# Patient Record
Sex: Female | Born: 1950 | Race: Black or African American | Hispanic: No | Marital: Married | Smoking: Never smoker
Health system: Southern US, Community
[De-identification: ages and names within clinical notes are randomized; demographics above are authoritative.]

## PROBLEM LIST (undated history)

## (undated) DIAGNOSIS — H409 Unspecified glaucoma: Secondary | ICD-10-CM

## (undated) DIAGNOSIS — E785 Hyperlipidemia, unspecified: Secondary | ICD-10-CM

## (undated) DIAGNOSIS — R002 Palpitations: Secondary | ICD-10-CM

## (undated) DIAGNOSIS — U071 COVID-19: Secondary | ICD-10-CM

## (undated) DIAGNOSIS — D649 Anemia, unspecified: Secondary | ICD-10-CM

## (undated) DIAGNOSIS — I10 Essential (primary) hypertension: Principal | ICD-10-CM

## (undated) DIAGNOSIS — F419 Anxiety disorder, unspecified: Secondary | ICD-10-CM

## (undated) HISTORY — DX: Essential (primary) hypertension: I10

## (undated) HISTORY — PX: TOE SURGERY: SHX1073

## (undated) HISTORY — PX: COLONOSCOPY: SHX174

## (undated) HISTORY — PX: WISDOM TOOTH EXTRACTION: SHX21

## (undated) HISTORY — DX: Palpitations: R00.2

## (undated) HISTORY — DX: Hyperlipidemia, unspecified: E78.5

## (undated) HISTORY — DX: Anxiety disorder, unspecified: F41.9

## (undated) HISTORY — PX: EXCISION MASS NECK: SHX6703

---

## 1998-08-07 ENCOUNTER — Other Ambulatory Visit: Admission: RE | Admit: 1998-08-07 | Discharge: 1998-08-07 | Payer: Self-pay | Admitting: Family Medicine

## 1998-10-19 ENCOUNTER — Other Ambulatory Visit: Admission: RE | Admit: 1998-10-19 | Discharge: 1998-10-19 | Payer: Self-pay | Admitting: Otolaryngology

## 1999-07-22 ENCOUNTER — Other Ambulatory Visit: Admission: RE | Admit: 1999-07-22 | Discharge: 1999-07-22 | Payer: Self-pay | Admitting: Otolaryngology

## 1999-09-30 ENCOUNTER — Other Ambulatory Visit: Admission: RE | Admit: 1999-09-30 | Discharge: 1999-09-30 | Payer: Self-pay | Admitting: Obstetrics and Gynecology

## 1999-09-30 ENCOUNTER — Encounter (INDEPENDENT_AMBULATORY_CARE_PROVIDER_SITE_OTHER): Payer: Self-pay | Admitting: Specialist

## 2000-12-29 ENCOUNTER — Other Ambulatory Visit: Admission: RE | Admit: 2000-12-29 | Discharge: 2000-12-29 | Payer: Self-pay | Admitting: Obstetrics and Gynecology

## 2001-02-28 ENCOUNTER — Encounter: Payer: Self-pay | Admitting: Emergency Medicine

## 2001-02-28 ENCOUNTER — Emergency Department (HOSPITAL_COMMUNITY): Admission: EM | Admit: 2001-02-28 | Discharge: 2001-02-28 | Payer: Self-pay | Admitting: Emergency Medicine

## 2001-04-25 ENCOUNTER — Ambulatory Visit (HOSPITAL_COMMUNITY): Admission: RE | Admit: 2001-04-25 | Discharge: 2001-04-25 | Payer: Self-pay | Admitting: Cardiology

## 2001-04-25 HISTORY — PX: CARDIAC CATHETERIZATION: SHX172

## 2001-04-25 HISTORY — PX: LEFT HEART CATH AND CORONARY ANGIOGRAPHY: CATH118249

## 2002-08-22 ENCOUNTER — Other Ambulatory Visit: Admission: RE | Admit: 2002-08-22 | Discharge: 2002-08-22 | Payer: Self-pay | Admitting: Obstetrics and Gynecology

## 2003-08-30 ENCOUNTER — Other Ambulatory Visit: Admission: RE | Admit: 2003-08-30 | Discharge: 2003-08-30 | Payer: Self-pay | Admitting: Obstetrics and Gynecology

## 2003-12-06 ENCOUNTER — Encounter: Admission: RE | Admit: 2003-12-06 | Discharge: 2004-01-02 | Payer: Self-pay | Admitting: Family Medicine

## 2004-09-10 ENCOUNTER — Other Ambulatory Visit: Admission: RE | Admit: 2004-09-10 | Discharge: 2004-09-10 | Payer: Self-pay | Admitting: Obstetrics and Gynecology

## 2009-01-30 HISTORY — PX: TRANSTHORACIC ECHOCARDIOGRAM: SHX275

## 2009-01-30 HISTORY — PX: OTHER SURGICAL HISTORY: SHX169

## 2009-02-13 HISTORY — PX: DOPPLER ECHOCARDIOGRAPHY: SHX263

## 2009-02-13 HISTORY — PX: NM MYOVIEW LTD: HXRAD82

## 2009-05-01 ENCOUNTER — Emergency Department (HOSPITAL_BASED_OUTPATIENT_CLINIC_OR_DEPARTMENT_OTHER): Admission: EM | Admit: 2009-05-01 | Discharge: 2009-05-01 | Payer: Self-pay | Admitting: Emergency Medicine

## 2010-03-23 ENCOUNTER — Encounter: Payer: Self-pay | Admitting: Obstetrics and Gynecology

## 2010-05-26 LAB — BASIC METABOLIC PANEL
BUN: 12 mg/dL (ref 6–23)
CO2: 30 mEq/L (ref 19–32)
Calcium: 9.6 mg/dL (ref 8.4–10.5)
Chloride: 105 mEq/L (ref 96–112)
Creatinine, Ser: 0.7 mg/dL (ref 0.4–1.2)
GFR calc Af Amer: >60 mL/min (ref >60)
GFR calc non Af Amer: >60 mL/min (ref >60)
Glucose, Bld: 80 mg/dL (ref 70–99)
Potassium: 3.9 mEq/L (ref 3.5–5.1)
Sodium: 144 mEq/L (ref 135–145)

## 2010-05-26 LAB — DIFFERENTIAL
Basophils Absolute: 0.1 10*3/uL (ref 0.0–0.1)
Basophils Relative: 1 % (ref 0–1)
Eosinophils Absolute: 0 10*3/uL (ref 0.0–0.7)
Eosinophils Relative: 0 % (ref 0–5)
Lymphocytes Relative: 46 % (ref 12–46)
Lymphs Abs: 2.2 10*3/uL (ref 0.7–4.0)
Monocytes Absolute: 0.4 10*3/uL (ref 0.1–1.0)
Monocytes Relative: 9 % (ref 3–12)
Neutro Abs: 2.1 10*3/uL (ref 1.7–7.7)
Neutrophils Relative %: 44 % (ref 43–77)

## 2010-05-26 LAB — CBC
HCT: 35.2 % — ABNORMAL LOW (ref 36.0–46.0)
Hemoglobin: 11.8 g/dL — ABNORMAL LOW (ref 12.0–15.0)
MCHC: 33.5 g/dL (ref 30.0–36.0)
MCV: 83 fL (ref 78.0–100.0)
Platelets: 229 10*3/uL (ref 150–400)
RBC: 4.24 MIL/uL (ref 3.87–5.11)
RDW: 13.5 % (ref 11.5–15.5)
WBC: 4.8 10*3/uL (ref 4.0–10.5)

## 2010-07-18 NOTE — Cardiovascular Report (Signed)
[place]. [facility]  Patient:    Nichole Walters, Nichole Walters Visit Number: [id number] MRN: [id number]          Service Type: CAT Location: [facility] ([place]) 2899 12 Attending Physician:  Loreli Dollar Dictated by:   Julieanne Manson, M.D. Proc. Date: 04/25/01 Admit Date:  04/25/2001   CC:         Miguel Aschoff, M.D.  Cardiac Catheterization Laboratory   Cardiac Catheterization  INDICATIONS FOR PROCEDURE: The patient is a 60 year old female, who has mitral valve prolapse and has had chronic PVCs. She has complained of atypical chest pain and underwent a nuclear study that was strongly positive for anterior ischemia. Because of this, she is brought to the catheterization lab for an outpatient cardiac catheterization.  DESCRIPTION OF PROCEDURE: The patient was prepped and draped in the usual sterile fashion exposing the right groin.  Following local anesthetic with 1% Xylocaine, the Seldinger technique was employed and a 5 [place] introducer sheath was placed in the right femoral artery.  Selective left and right coronary arteriography and ventriculography in the RAO projection was performed.  COMPLICATIONS: None.  EQUIPMENT: A 5 French Judkins configuration catheter.  RESULTS: 1. Hemodynamic monitoring: Central aortic pressure was 149/77, left    ventricular pressure was 150/14. There was no aortic valve gradient    noted at the time of pullback. 2. Ventriculography: Ventriculography in the RAO projection using    25 cc of contrast at 12 cc/sec. revealed normal left ventricular systolic    function. Ejection fraction greater than 60%.  Mitral valve prolapse    without mitral regurgitation was seen.  The end-diastolic pressure was    14.  CORONARY ARTERIOGRAPHY: No calcification was seen on fluoroscopy. 1. Left main: Normal. 2. LAD: The LAD extended down to the apex of the heart and gave rise to    two small diagonal branches. There was no significant coronary disease  in    this entire system. There was an area in the mid to distal portion of the    LAD where the vessel became from 3intramyocardial to extramyocardial    where there was some tenting of the LAD, but there was no decreased flow    and clearly no area of fixed obstructions. 3. Circumflex: The circumflex gave rise to two large OM vessels. There was    minor irregularities in the proximal circumflex but the OMs were free    of disease. 4. Right coronary artery: The right coronary artery was a small vessel at    about 2 mm in diameter. It gave rise to a PDA. This system was free of    disease.  CONCLUSIONS: 1. Normal left ventricular systolic function. 2. Mitral valve prolapse without mitral regurgitation. 3. Minimal irregularities in the circumflex. 4. Tenting in the left anterior descending where it become extra myocardial    from intramyocardial. There is clearly no areas of fixed obstruction.  DISCUSSION: It appears that her stress test was a false-positive study. Her PVCs are chronic and probably related to her mitral valve prolapse and I could see no areas to suggest obstructive flow. Dr. Tresa Endo reviewed the films with me and does not feel that there is epicardial coronary disease either. Dictated by:   Julieanne Manson, M.D. Attending Physician:  Loreli Dollar DD:  04/25/01 TD:  04/25/01 Job: [id number]

## 2010-12-10 ENCOUNTER — Other Ambulatory Visit: Payer: Self-pay | Admitting: Obstetrics and Gynecology

## 2012-01-12 ENCOUNTER — Other Ambulatory Visit: Payer: Self-pay | Admitting: Obstetrics and Gynecology

## 2012-01-12 DIAGNOSIS — R928 Other abnormal and inconclusive findings on diagnostic imaging of breast: Secondary | ICD-10-CM

## 2012-01-26 ENCOUNTER — Ambulatory Visit
Admission: RE | Admit: 2012-01-26 | Discharge: 2012-01-26 | Disposition: A | Discharge status: Home / Self Care | Payer: BC Managed Care – PPO | Source: Ambulatory Visit | Attending: Obstetrics and Gynecology | Admitting: Obstetrics and Gynecology

## 2012-01-26 ENCOUNTER — Other Ambulatory Visit: Payer: Self-pay | Admitting: Obstetrics and Gynecology

## 2012-01-26 DIAGNOSIS — R928 Other abnormal and inconclusive findings on diagnostic imaging of breast: Secondary | ICD-10-CM

## 2012-08-15 ENCOUNTER — Telehealth: Payer: Self-pay | Admitting: Cardiology

## 2012-08-15 MED ORDER — AMLODIPINE BESYLATE 5 MG PO TABS: 5.0000 mg | ORAL_TABLET | Freq: Every day | ORAL | Status: DC

## 2012-08-15 MED ORDER — IRBESARTAN-HYDROCHLOROTHIAZIDE 150-12.5 MG PO TABS: 1.0000 | ORAL_TABLET | Freq: Every day | ORAL | Status: DC

## 2012-08-15 NOTE — Telephone Encounter (Signed)
Amlodipine 5mg  QD, Irbesartan hctz 150/12.5mg  QD 90 day w/[facility]

## 2012-08-15 NOTE — Telephone Encounter (Signed)
Returned call.  Left message to call back before 4pm.  

## 2012-08-15 NOTE — Telephone Encounter (Signed)
Amlodipine 5mg QD, Irbesartan hctz 150/12.5mg QD 90 day w/2 [facility] 

## 2012-08-15 NOTE — Telephone Encounter (Signed)
Please call [facility] so she can get her Amlodipine Besylate 5mg  and Irbesaran-HCTZ 150/12.5-Need this right away-would you please call in refills so she will not have to call each time!

## 2012-08-16 ENCOUNTER — Other Ambulatory Visit: Payer: Self-pay | Admitting: *Deleted

## 2012-08-16 ENCOUNTER — Telehealth: Payer: Self-pay | Admitting: Cardiology

## 2012-08-16 MED ORDER — AMLODIPINE BESYLATE 5 MG PO TABS: 5.0000 mg | ORAL_TABLET | Freq: Every day | ORAL | Status: DC

## 2012-08-16 MED ORDER — IRBESARTAN-HYDROCHLOROTHIAZIDE 150-12.5 MG PO TABS: 1.0000 | ORAL_TABLET | Freq: Every day | ORAL | Status: DC

## 2012-08-16 NOTE — Telephone Encounter (Signed)
Returning your call. °

## 2012-08-16 NOTE — Telephone Encounter (Signed)
Returned call.  Pt informed refills sent yesterday and confirmed received by [facility] this morning.  Pt with concerns that she has to call the office every month and informed she should not have to call the office as her Rx has 2 refills on a 90-day supply.  Pt verbalized understanding and agreed w/ plan.  Pt will call back if she has any problems w/ getting Rx.

## 2012-08-16 NOTE — Telephone Encounter (Signed)
Nichole Walters says all she need is her prescription-says she is completely out of medicine!

## 2013-01-06 ENCOUNTER — Other Ambulatory Visit: Payer: Self-pay | Admitting: Obstetrics and Gynecology

## 2013-05-08 ENCOUNTER — Other Ambulatory Visit: Payer: Self-pay | Admitting: Cardiology

## 2013-05-08 ENCOUNTER — Encounter: Payer: Self-pay | Admitting: Cardiology

## 2013-05-08 ENCOUNTER — Ambulatory Visit (INDEPENDENT_AMBULATORY_CARE_PROVIDER_SITE_OTHER): Payer: BC Managed Care – PPO | Admitting: Cardiology

## 2013-05-08 VITALS — BP 120/70 | HR 63 | Ht 60.0 in | Wt 124.2 lb

## 2013-05-08 DIAGNOSIS — R002 Palpitations: Secondary | ICD-10-CM

## 2013-05-08 DIAGNOSIS — E785 Hyperlipidemia, unspecified: Secondary | ICD-10-CM | POA: Insufficient documentation

## 2013-05-08 DIAGNOSIS — I10 Essential (primary) hypertension: Secondary | ICD-10-CM

## 2013-05-08 HISTORY — DX: Essential (primary) hypertension: I10

## 2013-05-08 HISTORY — DX: Hyperlipidemia, unspecified: E78.5

## 2013-05-08 HISTORY — DX: Palpitations: R00.2

## 2013-05-08 NOTE — Telephone Encounter (Signed)
Rx was sent to pharmacy electronically. 

## 2013-05-08 NOTE — Patient Instructions (Signed)
Your blood pressure & Heart rate look great.  Your PCP put you on Pravastatin for your cholesterol -- this is a relatively mild cholesterol medicine & should be well tolerated.  One side effect is muscle aches or cramping.   If you start noticing these, try taking CoEnzyme Q 10 (CoQ10) -- an Over the Counter Vitamin Supplement (100-300 mg daily).  I will see you back in 1 yr.  Leonie Man, MD  Your physician wants you to follow-up in: 1 yr.  You will receive a reminder letter in the mail two months in advance. If you don't receive a letter, please call our office to schedule the follow-up appointment.

## 2013-05-10 ENCOUNTER — Encounter: Payer: Self-pay | Admitting: Cardiology

## 2013-05-10 NOTE — Progress Notes (Signed)
PATIENT: Nichole Walters MRN: [id number]  DOB: 12/22/1950   DOV:05/10/2013 PCP: Gavin Pound, MD  Clinic Note: Chief Complaint  Patient presents with  . Annual Exam    no complaints   HPI: Nichole Walters is a 63 y.o.  female with a PMH below who presents today for annual cardiology followup. She is a 63-year-old woman history of nausea coronary disease in 2003 and not carotid disease by Doppler's. She palpitations or that related to anxiety. Her cardiac risk factors include hypertension dyslipidemia..  Interval History: She presents today feeling well. She IV symptoms and of late mostly because of her social stressors being less apparent. In light of this, her palpitations of essentially become nonexistent.  She notes having had a relatively normal evaluation per primary care physician with a relatively well-controlled blood pressures recently. No chest pain or shortness of breath with rest or exertion. No PND, orthopnea or edema.  Minimal palpitations, with No lightheadedness, dizziness, weakness or syncope/near syncope. No TIA/amaurosis fugax symptoms. No melena, hematochezia hematuria.  Past Medical History  Diagnosis Date  . Essential hypertension 05/08/2013  . Palpitations 05/08/2013  . Dyslipidemia 05/08/2013  . Anxiety     Prior Cardiac Evaluation   Procedure Laterality Date  . Transthoracic echocardiogram  December 2010    Normal LV size and function. Mild MR and TR. Otherwise normal. Normal diastolic function.  Marland Kitchen Nm persantine myoview ltd  December 2010    EF> 70%. No ischemia or infarction    No Known Allergies  Current Outpatient Prescriptions  Medication Sig Dispense Refill  . amLODipine (NORVASC) 5 MG tablet TAKE 1 TABLET BY MOUTH EVERY DAY  90 tablet  0  . irbesartan-hydrochlorothiazide (AVALIDE) 150-12.5 MG per tablet Take 1 tablet by mouth daily.  90 tablet  2  . latanoprost (XALATAN) 0.005 % ophthalmic solution Place 1 drop into both eyes at bedtime.      Marland Kitchen OVER THE  COUNTER MEDICATION daily. Ligaplex II 5300 - herbal supplement      . pravastatin (PRAVACHOL) 20 MG tablet Take 1 tablet by mouth daily.       No current facility-administered medications for this visit.   History   Social History Narrative   Single mother of one, grandmother 74, great-grandmother 2.   Does not smoke, does not drink.   Exercises usually on treadmill mostly 3 days a week.   ROS: A comprehensive Review of Systems - Negative except Notably improved anxiety and palpitations. She does have mild cramping and muscle aching. Otherwise  PHYSICAL EXAM BP 120/70  Pulse 63  Ht 5' (1.524 m)  Wt 124 lb 3.2 oz (56.337 kg)  BMI 24.26 kg/m2 General appearance: alert, cooperative, appears stated age, no distress and Well-nourished and well-groomed. Normal mood and affect. Neck: no adenopathy, no carotid bruit, no JVD, supple, symmetrical, trachea midline and thyroid not enlarged, symmetric, no tenderness/mass/nodules Lungs: clear to auscultation bilaterally, normal percussion bilaterally and Nonlabored, good air movement. Heart: RRR, normal S1 and S2. Soft 1/6 at most at this time her to the apex. No other R/G. Nondisplaced PMI. Abdomen: soft, non-tender; bowel sounds normal; no masses,  no organomegaly Extremities: extremities normal, atraumatic, no cyanosis or edema, no edema, redness or tenderness in the calves or thighs and no ulcers, gangrene or trophic changes Pulses: 2+ and symmetric Neurologic: Alert and oriented X 3, normal strength and tone. Normal symmetric reflexes. Normal coordination and gait  OZY:YQMGNOIBB today: Yes Rate: 63 , Rhythm: NSR, normal EKG;  Recent  Labs: 01/12/2013  CBC: WBC 4.4, H./H. 11.4/37.7, platelet count 244.  Chemistries: Sodium 140, potassium 4.1, chloride 105, bicarbonate 31, BUN 15, creatinine 0.66, calcium 9.6. LFTs normal.  TC 181 a 1, TG 67, HDL 53, LDL 114.  ASSESSMENT / PLAN: Essential hypertension Excellent control on current  regimen.  Palpitations Much improved with less anxiety. She has not been on a beta blocker. That would be a potential option if palpitations recur.  Dyslipidemia On pravastatin. With her risk factors, LDL of 114 his acceptable.    Orders Placed This Encounter  Procedures  . EKG 12-Lead   Meds ordered this encounter  Medications  . pravastatin (PRAVACHOL) 20 MG tablet    Sig: Take 1 tablet by mouth daily.  Marland Kitchen latanoprost (XALATAN) 0.005 % ophthalmic solution    Sig: Place 1 drop into both eyes at bedtime.  Marland Kitchen OVER THE COUNTER MEDICATION    Sig: daily. Ligaplex II 5300 - herbal supplement    Followup: One year  DAVID W. Ellyn Hack, M.D., M.S. Interventional Cardiology [facility]

## 2013-05-10 NOTE — Assessment & Plan Note (Signed)
Much improved with less anxiety. She has not been on a beta blocker. That would be a potential option if palpitations recur.

## 2013-05-10 NOTE — Assessment & Plan Note (Signed)
Excellent control on current regimen. 

## 2013-05-10 NOTE — Assessment & Plan Note (Signed)
On pravastatin. With her risk factors, LDL of 114 his acceptable.

## 2013-05-11 ENCOUNTER — Encounter: Payer: Self-pay | Admitting: Cardiology

## 2013-07-18 ENCOUNTER — Other Ambulatory Visit: Payer: Self-pay | Admitting: *Deleted

## 2013-07-18 MED ORDER — IRBESARTAN-HYDROCHLOROTHIAZIDE 150-12.5 MG PO TABS: 1.0000 | ORAL_TABLET | Freq: Every day | ORAL | Status: DC

## 2013-07-18 MED ORDER — AMLODIPINE BESYLATE 5 MG PO TABS: 5.0000 mg | ORAL_TABLET | Freq: Every day | ORAL | Status: DC

## 2013-09-07 ENCOUNTER — Other Ambulatory Visit: Payer: Self-pay | Admitting: *Deleted

## 2014-05-15 ENCOUNTER — Other Ambulatory Visit: Payer: Self-pay | Admitting: *Deleted

## 2014-05-15 ENCOUNTER — Telehealth: Payer: Self-pay | Admitting: Cardiology

## 2014-05-15 MED ORDER — AMLODIPINE BESYLATE 5 MG PO TABS: 5.0000 mg | ORAL_TABLET | Freq: Every day | ORAL | Status: DC

## 2014-05-15 NOTE — Telephone Encounter (Signed)
filled

## 2014-05-15 NOTE — Telephone Encounter (Signed)
°  1. Which medications need to be refilled? Amlodipne 2. Which pharmacy [phone]  3. Do they need a 30 day or 90 day supply? 90 and refills  4. Would they like a call back once the medication has been sent to the pharmacy? yes

## 2014-05-21 ENCOUNTER — Ambulatory Visit: Payer: Self-pay | Admitting: Cardiology

## 2014-07-06 ENCOUNTER — Ambulatory Visit (INDEPENDENT_AMBULATORY_CARE_PROVIDER_SITE_OTHER): Payer: BLUE CROSS/BLUE SHIELD | Admitting: Cardiology

## 2014-07-06 ENCOUNTER — Encounter: Payer: Self-pay | Admitting: Cardiology

## 2014-07-06 VITALS — BP 112/64 | HR 62 | Ht 60.0 in | Wt 132.1 lb

## 2014-07-06 DIAGNOSIS — E785 Hyperlipidemia, unspecified: Secondary | ICD-10-CM | POA: Diagnosis not present

## 2014-07-06 DIAGNOSIS — I10 Essential (primary) hypertension: Secondary | ICD-10-CM | POA: Diagnosis not present

## 2014-07-06 DIAGNOSIS — R002 Palpitations: Secondary | ICD-10-CM

## 2014-07-06 NOTE — Progress Notes (Signed)
PATIENT: Nichole Walters MRN: [id number]  DOB: [birth year]   DOV:07/06/2014 PCP: Gavin Pound, MD  Clinic Note: Chief Complaint  Patient presents with  . Annual Exam    pt denied chest pain and SOB   HPI: Nichole Walters is a 64 y.o.  female with a PMH below who presents today for annual cardiology followup.   Interval History: She presents today feeling well.  Has been under a great deal of stress with work & social schedule - 6-7 d/week working & late nights with little sleep -- thankfully this schedule is now coming to an end she will be back to her normal schedule. Hopefully that will reduce her anxiety.  With this schedule, she has been quite stressed & tired.  Despite all the stress, her Palpitations have been stable to non-existent.  No chest pain or shortness of breath with rest or exertion. No PND, orthopnea or edema.  Minimal palpitations, with No lightheadedness, dizziness, weakness or syncope/near syncope. No TIA/amaurosis fugax symptoms.  Past Medical History  Diagnosis Date  . Essential hypertension 05/08/2013  . Palpitations 05/08/2013  . Dyslipidemia 05/08/2013  . Anxiety     Prior Cardiac Evaluation   Procedure Laterality Date  . Transthoracic echocardiogram  December 2010    Normal LV size and function. Mild MR and TR. Otherwise normal. Normal diastolic function.  Marland Kitchen Nm persantine myoview ltd  December 2010    EF> 70%. No ischemia or infarction   No Known Allergies  Current Outpatient Prescriptions  Medication Sig Dispense Refill  . amLODipine (NORVASC) 5 MG tablet Take 1 tablet (5 mg total) by mouth daily. 90 tablet 3  . irbesartan-hydrochlorothiazide (AVALIDE) 150-12.5 MG per tablet Take 1 tablet by mouth daily. 90 tablet 3  . latanoprost (XALATAN) 0.005 % ophthalmic solution Place 1 drop into both eyes at bedtime.    Marland Kitchen OVER THE COUNTER MEDICATION daily. Ligaplex II 5300 - herbal supplement    . pravastatin (PRAVACHOL) 20 MG tablet Take 1 tablet by mouth daily.      No current facility-administered medications for this visit.   History   Social History Narrative   Single mother of one, grandmother 15, great-grandmother 2.   Does not smoke, does not drink.   Exercises usually on treadmill mostly 3 days a week.   No family history on file.  Paternal Aunts & uncles had CVAs, PGM- CVA  ROS: A comprehensive Review of Systems - was performed  Review of Systems  Constitutional: Negative for weight loss (with busy work schedule she has not been exercising much and has gained some weight.).  HENT: Negative for nosebleeds.        No allergy problems  Respiratory: Negative for cough and shortness of breath.   Cardiovascular: Negative for claudication.  Gastrointestinal: Negative for heartburn, constipation and blood in stool.  Genitourinary: Negative for hematuria.  Endo/Heme/Allergies: Does not bruise/bleed easily.  Psychiatric/Behavioral: The patient is nervous/anxious (With work schedule).        Finally at the end of the 9 months rigorous schedule, starting to relax now - back to normal schedule & hopes to get back exercising.  All other systems reviewed and are negative.   Wt Readings from Last 3 Encounters:  07/06/14 59.92 kg (132 lb 1.6 oz)  05/08/13 56.337 kg (124 lb 3.2 oz)    PHYSICAL EXAM BP 112/64 mmHg  Pulse 62  Ht 5' (1.524 m)  Wt 59.92 kg (132 lb 1.6 oz)  BMI 25.80 kg/m2 General  appearance: alert, cooperative, appears stated age, no distress and Well-nourished and well-groomed. Normal mood and affect. Neck: no adenopathy, no carotid bruit, no JVD, supple, symmetrical, trachea midline and thyroid not enlarged, symmetric, no tenderness/mass/nodules Lungs: clear to auscultation bilaterally, normal percussion bilaterally and Nonlabored, good air movement. Heart: RRR, normal S1 and S2. Soft 1/6 at most at this time her to the apex. No other R/G. Nondisplaced PMI. Abdomen: soft, non-tender; bowel sounds normal; no masses,  no  organomegaly Extremities: extremities normal, atraumatic, no cyanosis or edema, no edema, redness or tenderness in the calves or thighs and no ulcers, gangrene or trophic changes Pulses: 2+ and symmetric Neurologic: Alert and oriented X 3, normal strength and tone. Normal symmetric reflexes. Normal coordination and gait  IBB:CWUGQBVQX today: Yes Rate: 62 , Rhythm: NSR, normal EKG (~ CRO Ant Infarct, age undetermined); stable EKG   Recent Labs: Pending - to be checked in Oct (do not have labs from 2015) -- stared Pravachol last year  ASSESSMENT / PLAN:  Problem List Items Addressed This Visit    None       Followup: One year  DAVID W. Ellyn Hack, M.D., M.S. Interventional Cardiology [facility]   Essential hypertension Excellent control on current regimen.  Palpitations Much improved with less anxiety. She has not been on a beta blocker. That would be a potential option if palpitations recur.  Dyslipidemia On pravastatin. With her risk factors, LDL of 114 his acceptable.

## 2014-07-06 NOTE — Patient Instructions (Signed)
No change with  current medication  Your physician wants you to follow-up in 12 months Dr Ellyn Hack.  You will receive a reminder letter in the mail two months in advance. If you don't receive a letter, please call our office to schedule the follow-up appointment.

## 2014-07-08 ENCOUNTER — Encounter: Payer: Self-pay | Admitting: Cardiology

## 2014-07-08 NOTE — Assessment & Plan Note (Signed)
Excellent control on current regimen. 

## 2014-07-08 NOTE — Assessment & Plan Note (Addendum)
Started on pravastatin last year. Labs monitored by PCP. Goal LDL would be between 100 to 130.

## 2014-07-08 NOTE — Assessment & Plan Note (Signed)
Her palpitations seem to have been less of an issue for her despite all of her social stresses with work. Not on beta blocker -- but as long as she is asymptomatic/minimally symptomatic would not add medication.

## 2014-09-07 ENCOUNTER — Telehealth: Payer: Self-pay | Admitting: Cardiology

## 2014-09-07 MED ORDER — IRBESARTAN-HYDROCHLOROTHIAZIDE 150-12.5 MG PO TABS: 1.0000 | ORAL_TABLET | Freq: Every day | ORAL | Status: DC

## 2014-09-07 NOTE — Telephone Encounter (Signed)
Refill submitted to patient's preferred pharmacy.  

## 2014-09-07 NOTE — Telephone Encounter (Signed)
°  1. Which medications need to be refilled? Irbesartan-HCTZ  2. Which [phone]  3. Do they need a 30 day or 90 day supply? 90 and refills  4. Would they like a call back once the medication has been sent to the pharmacy? no

## 2015-03-06 LAB — HM PAP SMEAR: HM Pap smear: NEGATIVE

## 2015-07-09 NOTE — H&P (Signed)
Nichole Walters is an 65 y.o. female. She presents for hysteroscopy and endometrial polypectomy  Pertinent Gynecological History: Menses: post-menopausal Bleeding: post menopausal bleeding Last mammogram: normal Date: 2017 Last pap: normal Date: 2014 OB History: G2, P1011    Past Medical History  Diagnosis Date  . Essential hypertension 05/08/2013  . Palpitations 05/08/2013  . Dyslipidemia 05/08/2013  . Anxiety     Past Surgical History  Procedure Laterality Date  . Transthoracic echocardiogram  December 2010    Normal LV size and function. Mild MR and TR. Otherwise normal. Normal diastolic function.  Marland Kitchen Nm persantine myoview ltd  December 2010    EF> 70%. No ischemia or infarction  . Doppler echocardiography  02/13/2009    EF>55%  . Nm myoview ltd  02/13/2009    EF 70%  Low risk scan  . Cardiac catheterization  04/25/2001    EF>60%    No family history on file.  Social History:  reports that she has never smoked. She has never used smokeless tobacco. She reports that she does not drink alcohol or use illicit drugs.  Allergies: No Known Allergies  No prescriptions prior to admission    Review of Systems  Constitutional: Negative.   Eyes: Negative.   Respiratory: Negative.   Cardiovascular: Negative.   Gastrointestinal: Negative.   Musculoskeletal: Negative.   Skin: Negative.   Neurological: Negative.     There were no vitals taken for this visit. Physical Exam  Constitutional: She appears well-developed and well-nourished.  HENT:  Head: Normocephalic.  Eyes: Pupils are equal, round, and reactive to light.  Neck: Normal range of motion.  Cardiovascular: Normal rate and regular rhythm.   Respiratory: Effort normal.  GI: Soft.  Genitourinary: Vagina normal.  3 cm left pedunculated firbroid.  Musculoskeletal: Normal range of motion.  Neurological: She is alert.    Office ultrasound shows 2 cm thickened endometrium.  Office endometrial aspiration shows benign  tissue and polyp fragments.  Assessment/Plan: Post menopausal bleeding with thickened endometrium.  Office sample was benign and she is admitted for hysteroscopy to confirm benign endometrium and to removed probably endometrial polyp.  Cyndia Degraff D 07/09/2015, 5:31 PM

## 2015-07-09 NOTE — Patient Instructions (Addendum)
Your procedure is scheduled on:  Monday, Jul 22, 2015  Enter through the Main Entrance of [facility] at: 8:30 AM  Pick up the phone at the desk and dial [phone].  Call this number if you have problems the morning of surgery: [phone].  Remember: Do NOT eat food or drink after:  Midnight Sunday, Jul 21, 2015  Take these medicines the morning of surgery with a SIP OF WATER:  Amlodipine, irbesartan-hydrochlorothiazide, Pravastatin  Do NOT wear jewelry (body piercing), metal hair clips/bobby pins, make-up, or nail polish. Do NOT wear lotions, powders, or perfumes.  You may wear deodorant. Do NOT shave for 48 hours prior to surgery. Do NOT bring valuables to the hospital. Contacts, dentures, or bridgework may not be worn into surgery.  Have a responsible adult drive you home and stay with you for 24 hours after your procedure.  Home with Brother Salvadore Dom cell [phone].

## 2015-07-11 ENCOUNTER — Encounter (HOSPITAL_COMMUNITY): Payer: Self-pay

## 2015-07-11 ENCOUNTER — Other Ambulatory Visit: Payer: Self-pay

## 2015-07-11 ENCOUNTER — Encounter (HOSPITAL_COMMUNITY)
Admission: RE | Admit: 2015-07-11 | Discharge: 2015-07-11 | Disposition: A | Discharge status: Home / Self Care | Payer: BLUE CROSS/BLUE SHIELD | Source: Ambulatory Visit | Attending: Obstetrics & Gynecology | Admitting: Obstetrics & Gynecology

## 2015-07-11 DIAGNOSIS — F419 Anxiety disorder, unspecified: Secondary | ICD-10-CM | POA: Insufficient documentation

## 2015-07-11 DIAGNOSIS — I10 Essential (primary) hypertension: Secondary | ICD-10-CM | POA: Insufficient documentation

## 2015-07-11 DIAGNOSIS — Z01812 Encounter for preprocedural laboratory examination: Secondary | ICD-10-CM | POA: Diagnosis present

## 2015-07-11 DIAGNOSIS — N959 Unspecified menopausal and perimenopausal disorder: Secondary | ICD-10-CM | POA: Diagnosis not present

## 2015-07-11 LAB — BASIC METABOLIC PANEL
Anion gap: 6 (ref 5–15)
BUN: 12 mg/dL (ref 6–20)
CHLORIDE: 106 mmol/L (ref 101–111)
CO2: 29 mmol/L (ref 22–32)
Calcium: 9.5 mg/dL (ref 8.9–10.3)
Creatinine, Ser: 0.55 mg/dL (ref 0.44–1.00)
GFR calc Af Amer: >60 mL/min (ref >60)
GFR calc non Af Amer: >60 mL/min (ref >60)
GLUCOSE: 88 mg/dL (ref 65–99)
POTASSIUM: 3.9 mmol/L (ref 3.5–5.1)
Sodium: 141 mmol/L (ref 135–145)

## 2015-07-11 LAB — CBC
HCT: 35.4 % — ABNORMAL LOW (ref 36.0–46.0)
HEMOGLOBIN: 11.6 g/dL — AB (ref 12.0–15.0)
MCH: 26.3 pg (ref 26.0–34.0)
MCHC: 32.8 g/dL (ref 30.0–36.0)
MCV: 80.3 fL (ref 78.0–100.0)
Platelets: 253 10*3/uL (ref 150–400)
RBC: 4.41 MIL/uL (ref 3.87–5.11)
RDW: 15.4 % (ref 11.5–15.5)
WBC: 5.3 10*3/uL (ref 4.0–10.5)

## 2015-07-18 MED ORDER — SCOPOLAMINE 1 MG/3DAYS TD PT72: 1.0000 | MEDICATED_PATCH | Freq: Once | TRANSDERMAL | Status: DC

## 2015-07-22 ENCOUNTER — Ambulatory Visit (HOSPITAL_COMMUNITY): Payer: BLUE CROSS/BLUE SHIELD | Admitting: Anesthesiology

## 2015-07-22 ENCOUNTER — Encounter (HOSPITAL_COMMUNITY): Payer: Self-pay | Admitting: Anesthesiology

## 2015-07-22 ENCOUNTER — Encounter (HOSPITAL_COMMUNITY)
Admission: RE | Disposition: A | Discharge status: Home / Self Care | Payer: Self-pay | Source: Ambulatory Visit | Attending: Obstetrics & Gynecology

## 2015-07-22 ENCOUNTER — Ambulatory Visit (HOSPITAL_COMMUNITY)
Admission: RE | Admit: 2015-07-22 | Discharge: 2015-07-22 | Disposition: A | Discharge status: Home / Self Care | Payer: BLUE CROSS/BLUE SHIELD | Source: Ambulatory Visit | Attending: Obstetrics & Gynecology | Admitting: Obstetrics & Gynecology

## 2015-07-22 DIAGNOSIS — I10 Essential (primary) hypertension: Secondary | ICD-10-CM | POA: Diagnosis not present

## 2015-07-22 DIAGNOSIS — R938 Abnormal findings on diagnostic imaging of other specified body structures: Secondary | ICD-10-CM | POA: Diagnosis not present

## 2015-07-22 DIAGNOSIS — N950 Postmenopausal bleeding: Secondary | ICD-10-CM | POA: Insufficient documentation

## 2015-07-22 DIAGNOSIS — F419 Anxiety disorder, unspecified: Secondary | ICD-10-CM | POA: Insufficient documentation

## 2015-07-22 DIAGNOSIS — N840 Polyp of corpus uteri: Secondary | ICD-10-CM | POA: Insufficient documentation

## 2015-07-22 HISTORY — PX: HYSTEROSCOPY: SHX211

## 2015-07-22 SURGERY — HYSTEROSCOPY
Anesthesia: General

## 2015-07-22 MED ORDER — ONDANSETRON HCL 4 MG/2ML IJ SOLN
INTRAMUSCULAR | Status: DC | PRN
  Administered 2015-07-22: 4 mg via INTRAVENOUS

## 2015-07-22 MED ORDER — GLYCOPYRROLATE 0.2 MG/ML IJ SOLN
INTRAMUSCULAR | Status: AC
  Filled 2015-07-22: qty 1

## 2015-07-22 MED ORDER — EPHEDRINE SULFATE 50 MG/ML IJ SOLN
INTRAMUSCULAR | Status: DC | PRN
  Administered 2015-07-22 (×4): 5 mg via INTRAVENOUS

## 2015-07-22 MED ORDER — FENTANYL CITRATE (PF) 100 MCG/2ML IJ SOLN: 25.0000 ug | INTRAMUSCULAR | Status: DC | PRN

## 2015-07-22 MED ORDER — GLYCOPYRROLATE 0.2 MG/ML IJ SOLN
INTRAMUSCULAR | Status: DC | PRN
  Administered 2015-07-22: 0.1 mg via INTRAVENOUS

## 2015-07-22 MED ORDER — LACTATED RINGERS IV SOLN
INTRAVENOUS | Status: DC
  Administered 2015-07-22 (×2): via INTRAVENOUS

## 2015-07-22 MED ORDER — KETOROLAC TROMETHAMINE 30 MG/ML IJ SOLN: 30.0000 mg | Freq: Once | INTRAMUSCULAR | Status: DC

## 2015-07-22 MED ORDER — PHENYLEPHRINE HCL 10 MG/ML IJ SOLN
INTRAMUSCULAR | Status: DC | PRN
  Administered 2015-07-22: 80 ug via INTRAVENOUS
  Administered 2015-07-22: 40 ug via INTRAVENOUS

## 2015-07-22 MED ORDER — ONDANSETRON HCL 4 MG/2ML IJ SOLN: 4.0000 mg | Freq: Once | INTRAMUSCULAR | Status: DC | PRN

## 2015-07-22 MED ORDER — KETOROLAC TROMETHAMINE 30 MG/ML IJ SOLN
INTRAMUSCULAR | Status: AC
  Filled 2015-07-22: qty 1

## 2015-07-22 MED ORDER — MEPERIDINE HCL 25 MG/ML IJ SOLN: 6.2500 mg | INTRAMUSCULAR | Status: DC | PRN

## 2015-07-22 MED ORDER — KETOROLAC TROMETHAMINE 30 MG/ML IJ SOLN
INTRAMUSCULAR | Status: DC | PRN
  Administered 2015-07-22: 30 mg via INTRAVENOUS

## 2015-07-22 MED ORDER — FENTANYL CITRATE (PF) 100 MCG/2ML IJ SOLN
INTRAMUSCULAR | Status: DC | PRN
  Administered 2015-07-22: 25 ug via INTRAVENOUS
  Administered 2015-07-22: 50 ug via INTRAVENOUS

## 2015-07-22 MED ORDER — PROPOFOL 10 MG/ML IV BOLUS
INTRAVENOUS | Status: DC | PRN
  Administered 2015-07-22: 120 mg via INTRAVENOUS

## 2015-07-22 MED ORDER — LIDOCAINE HCL (CARDIAC) 20 MG/ML IV SOLN
INTRAVENOUS | Status: DC | PRN
  Administered 2015-07-22: 80 mg via INTRAVENOUS

## 2015-07-22 MED ORDER — LIDOCAINE HCL (CARDIAC) 20 MG/ML IV SOLN
INTRAVENOUS | Status: AC
  Filled 2015-07-22: qty 5

## 2015-07-22 MED ORDER — SODIUM CHLORIDE 0.9 % IR SOLN
Status: DC | PRN
  Administered 2015-07-22: 3000 mL

## 2015-07-22 MED ORDER — ONDANSETRON HCL 4 MG/2ML IJ SOLN
INTRAMUSCULAR | Status: AC
  Filled 2015-07-22: qty 2

## 2015-07-22 MED ORDER — LIDOCAINE HCL 2 % IJ SOLN
INTRAMUSCULAR | Status: AC
  Filled 2015-07-22: qty 20

## 2015-07-22 MED ORDER — DEXAMETHASONE SODIUM PHOSPHATE 10 MG/ML IJ SOLN
INTRAMUSCULAR | Status: DC | PRN
  Administered 2015-07-22: 8 mg via INTRAVENOUS

## 2015-07-22 MED ORDER — LIDOCAINE HCL 2 % IJ SOLN
INTRAMUSCULAR | Status: DC | PRN
  Administered 2015-07-22: 10 mL

## 2015-07-22 MED ORDER — EPHEDRINE 5 MG/ML INJ
INTRAVENOUS | Status: AC
  Filled 2015-07-22: qty 10

## 2015-07-22 MED ORDER — MIDAZOLAM HCL 2 MG/2ML IJ SOLN
INTRAMUSCULAR | Status: DC | PRN
  Administered 2015-07-22: 1 mg via INTRAVENOUS

## 2015-07-22 MED ORDER — FENTANYL CITRATE (PF) 100 MCG/2ML IJ SOLN
INTRAMUSCULAR | Status: AC
  Filled 2015-07-22: qty 2

## 2015-07-22 MED ORDER — DEXAMETHASONE SODIUM PHOSPHATE 10 MG/ML IJ SOLN
INTRAMUSCULAR | Status: AC
  Filled 2015-07-22: qty 1

## 2015-07-22 MED ORDER — PROPOFOL 10 MG/ML IV BOLUS
INTRAVENOUS | Status: AC
  Filled 2015-07-22: qty 20

## 2015-07-22 MED ORDER — MIDAZOLAM HCL 2 MG/2ML IJ SOLN
INTRAMUSCULAR | Status: AC
  Filled 2015-07-22: qty 2

## 2015-07-22 SURGICAL SUPPLY — 20 items
BIPOLAR CUTTING LOOP 21FR (ELECTRODE)
CANISTER SUCT 3000ML (MISCELLANEOUS) ×2 IMPLANT
CATH ROBINSON RED A/P 16FR (CATHETERS) IMPLANT
CLOTH BEACON ORANGE TIMEOUT ST (SAFETY) ×2 IMPLANT
CONTAINER PREFILL 10% NBF 60ML (FORM) ×4 IMPLANT
DEVICE MYOSURE REACH (MISCELLANEOUS) ×2 IMPLANT
ELECT REM PT RETURN 9FT ADLT (ELECTROSURGICAL)
ELECTRODE REM PT RTRN 9FT ADLT (ELECTROSURGICAL) IMPLANT
GLOVE BIOGEL PI IND STRL 7.0 (GLOVE) ×1 IMPLANT
GLOVE BIOGEL PI INDICATOR 7.0 (GLOVE) ×1
GLOVE ECLIPSE 6.0 STRL STRAW (GLOVE) ×4 IMPLANT
GOWN STRL REUS W/TWL LRG LVL3 (GOWN DISPOSABLE) ×4 IMPLANT
LOOP CUTTING BIPOLAR 21FR (ELECTRODE) IMPLANT
PACK VAGINAL MINOR WOMEN LF (CUSTOM PROCEDURE TRAY) ×2 IMPLANT
PAD OB MATERNITY 4.3X12.25 (PERSONAL CARE ITEMS) ×2 IMPLANT
PAD PREP 24X48 CUFFED NSTRL (MISCELLANEOUS) ×2 IMPLANT
TOWEL OR 17X24 6PK STRL BLUE (TOWEL DISPOSABLE) ×4 IMPLANT
TUBING AQUILEX INFLOW (TUBING) ×2 IMPLANT
TUBING AQUILEX OUTFLOW (TUBING) ×2 IMPLANT
WATER STERILE IRR 1000ML POUR (IV SOLUTION) ×2 IMPLANT

## 2015-07-22 NOTE — Discharge Instructions (Signed)
Hysteroscopy, Care After Refer to this sheet in the next few weeks. These instructions provide you with information on caring for yourself after your procedure. Your health care provider may also give you more specific instructions. Your treatment has been planned according to current medical practices, but problems sometimes occur. Call your health care provider if you have any problems or questions after your procedure.  WHAT TO EXPECT AFTER THE PROCEDURE After your procedure, it is typical to have the following:  You may have some cramping. This normally lasts for a couple days.  You may have bleeding. This can vary from light spotting for a few days to menstrual-like bleeding for 3-7 days. HOME CARE INSTRUCTIONS  Rest for the first 1-2 days after the procedure.  Only take over-the-counter or prescription medicines as directed by your health care provider. Do not take aspirin. It can increase the chances of bleeding.  Take showers instead of baths for 2 weeks or as directed by your health care provider.  Do not drive for 24 hours or as directed.  Do not drink alcohol while taking pain medicine.  Do not use tampons, douche, or have sexual intercourse for 2 weeks or until your health care provider says it is okay.  Take your temperature twice a day for 4-5 days. Write it down each time.  Follow your health care provider's advice about diet, exercise, and lifting.  If you develop constipation, you may:  Take a mild laxative if your health care provider approves.  Add bran foods to your diet.  Drink enough fluids to keep your urine clear or pale yellow.  Try to have someone with you or available to you for the first 24-48 hours, especially if you were given a general anesthetic.  Call Dr. Alden Hipp at [phone] if you are having any post op problems.    This information is not intended to replace advice given to you by your health care provider. Make sure you discuss any  questions you have with your health care provider.   Document Released: 12/07/2012 Document Reviewed: 12/07/2012 Elsevier Interactive Patient Education 2016 [place] not take ibuprofen/motrin/advil until 4:45pm 07/22/15.  Recovery- [id number]

## 2015-07-22 NOTE — Progress Notes (Signed)
I have interviewed and performed the pertinent exams on my patient to confirm that there have been no significant changes in her condition since the dictation of her history and physical exam.  

## 2015-07-22 NOTE — Op Note (Signed)
Patient Name: Nichole Walters MRN: [id number]  Date of Surgery: 07/22/2015    PREOPERATIVE DIAGNOSIS: ENDOMETRIAL POLYP  POSTOPERATIVE DIAGNOSIS: ENDOMETRIAL POLYP   PROCEDURE: Hysteroscopy, Endometrial polypectomy with Myosure device, D&C  SURGEON: Kerrie Latour D. Deatra Ina M.D.  ANESTHESIA: General  ESTIMATED BLOOD LOSS: Minimal  FINDINGS: 3 cm endometrial polyp   INDICATIONS: Post menopausal bleeding  PROCEDURE IN DETAIL: The patient was taken to the OR and placed in the dors-lithotomy position. The perineum and vagina were prepped and draped in a sterile fashion. Bimanual exam revealed a mid to retroverted,top normal sized uterus which sounded to 9 cm. 10 ml of 2% lidocaine was infiltrated in the paracervical tissue and Pratt dilators were used to open the cervix to 18 [place]. A 5 mm hysteroscope was introduced and a large endometrial polyp was identified.  Small areas of glandular hyperplasia were also seen.  A 7 mm Myosure hysteroscopic device was then introduced and the polyp and small areas of abnormal endometrium were removed.  A sharp curettage was performed and the procedure was then terminated and the patient left the operating room in good condition.

## 2015-07-22 NOTE — Transfer of Care (Signed)
Immediate Anesthesia Transfer of Care Note  Patient: Nichole Walters  Procedure(s) Performed: Procedure(s): HYSTEROSCOPY (N/A)  Patient Location: PACU  Anesthesia Type:General  Level of Consciousness: awake, alert , oriented and patient cooperative  Airway & Oxygen Therapy: Patient Spontanous Breathing and Patient connected to nasal cannula oxygen  Post-op Assessment: Report given to RN and Post -op Vital signs reviewed and stable  Post vital signs: Reviewed and stable  Last Vitals:  Filed Vitals:   07/22/15 0847  BP: 143/77  Pulse: 73  Temp: 36.7 C  Resp: 16    Last Pain: There were no vitals filed for this visit.       Complications: No apparent anesthesia complications

## 2015-07-22 NOTE — Anesthesia Procedure Notes (Signed)
Procedure Name: LMA Insertion Date/Time: 07/22/2015 10:08 AM Performed by: Georgeanne Nim Pre-anesthesia Checklist: Patient identified, Patient being monitored, Emergency Drugs available, Timeout performed and Suction available Patient Re-evaluated:Patient Re-evaluated prior to inductionOxygen Delivery Method: Circle system utilized Preoxygenation: Pre-oxygenation with 100% oxygen Intubation Type: IV induction Ventilation: Mask ventilation without difficulty LMA: LMA inserted LMA Size: 4.0 Number of attempts: 1 Placement Confirmation: breath sounds checked- equal and bilateral,  positive ETCO2 and CO2 detector Tube secured with: Tape Dental Injury: Teeth and Oropharynx as per pre-operative assessment

## 2015-07-22 NOTE — Anesthesia Preprocedure Evaluation (Signed)
Anesthesia Evaluation  Patient identified by MRN, date of birth, ID band Patient awake    Reviewed: Allergy & Precautions, H&P , NPO status , Patient's Chart, lab work & pertinent test results  Airway Mallampati: I  TM Distance: >3 FB Neck ROM: full    Dental no notable dental hx. (+) Teeth Intact   Pulmonary neg pulmonary ROS,    Pulmonary exam normal        Cardiovascular hypertension, Pt. on medications negative cardio ROS Normal cardiovascular exam     Neuro/Psych negative neurological ROS     GI/Hepatic negative GI ROS, Neg liver ROS,   Endo/Other  negative endocrine ROS  Renal/GU negative Renal ROS     Musculoskeletal   Abdominal Normal abdominal exam  (+)   Peds  Hematology negative hematology ROS (+)   Anesthesia Other Findings   Reproductive/Obstetrics negative OB ROS                             Anesthesia Physical Anesthesia Plan  ASA: II  Anesthesia Plan: General   Post-op Pain Management:    Induction: Intravenous  Airway Management Planned: LMA  Additional Equipment:   Intra-op Plan:   Post-operative Plan:   Informed Consent: I have reviewed the patients History and Physical, chart, labs and discussed the procedure including the risks, benefits and alternatives for the proposed anesthesia with the patient or authorized representative who has indicated his/her understanding and acceptance.   Dental Advisory Given and History available from chart only  Plan Discussed with: CRNA and Surgeon  Anesthesia Plan Comments:         Anesthesia Quick Evaluation

## 2015-07-23 NOTE — Anesthesia Postprocedure Evaluation (Signed)
Anesthesia Post Note  Patient: Nichole Walters  Procedure(s) Performed: Procedure(s) (LRB): HYSTEROSCOPY (N/A)  Patient location during evaluation: PACU Anesthesia Type: General Level of consciousness: awake and sedated Pain management: pain level controlled Vital Signs Assessment: post-procedure vital signs reviewed and stable Respiratory status: spontaneous breathing Cardiovascular status: stable Postop Assessment: no signs of nausea or vomiting Anesthetic complications: no     Last Vitals:  Filed Vitals:   07/22/15 1145 07/22/15 1235  BP: 139/76 135/78  Pulse: 100 71  Temp: 36.5 C 36.6 C  Resp: 16 14    Last Pain:  Filed Vitals:   07/22/15 1244  PainSc: 1    Pain Goal: Patients Stated Pain Goal: 5 (07/22/15 1145)               Celeste

## 2015-07-24 ENCOUNTER — Encounter (HOSPITAL_COMMUNITY): Payer: Self-pay | Admitting: Obstetrics & Gynecology

## 2015-08-01 LAB — HM COLONOSCOPY

## 2015-10-04 ENCOUNTER — Other Ambulatory Visit: Payer: Self-pay | Admitting: Cardiology

## 2015-10-04 NOTE — Telephone Encounter (Signed)
Rx(s) sent to pharmacy electronically.  

## 2015-11-11 ENCOUNTER — Telehealth: Payer: Self-pay | Admitting: Cardiology

## 2015-11-11 MED ORDER — PRAVASTATIN SODIUM 20 MG PO TABS: 20.0000 mg | ORAL_TABLET | Freq: Every day | ORAL | 3 refills | Status: DC

## 2015-11-11 NOTE — Telephone Encounter (Signed)
Go ahead and refill the Pravachol  Glenetta Hew, MD

## 2015-11-11 NOTE — Telephone Encounter (Signed)
New message    Pt needs to have a rx re-filled from another doctor b/c she is short 20 pills and she wants to know what to do. She no longer sees that doctor. She wants to know does she need to go to an urgent care office. Please call.

## 2015-11-11 NOTE — Telephone Encounter (Signed)
Returned call to patient-pt reports her PCP was prescribing her pravastatin but she is no longer seeing that PCP and they will not give her a rx to last her until her appt with Dr. Ellyn Hack.  Reports she has 3 days left of medication and wanted to see if we could send in a prescription.  OV appt 10/6 with MD Ellyn Hack.    Advised I would route to MD for approval.

## 2015-11-11 NOTE — Telephone Encounter (Signed)
Rx sent to pharmacy.  Pt made aware.   

## 2015-12-06 ENCOUNTER — Encounter: Payer: Self-pay | Admitting: Cardiology

## 2015-12-06 ENCOUNTER — Ambulatory Visit (INDEPENDENT_AMBULATORY_CARE_PROVIDER_SITE_OTHER): Payer: BLUE CROSS/BLUE SHIELD | Admitting: Cardiology

## 2015-12-06 DIAGNOSIS — I10 Essential (primary) hypertension: Secondary | ICD-10-CM

## 2015-12-06 DIAGNOSIS — R002 Palpitations: Secondary | ICD-10-CM | POA: Diagnosis not present

## 2015-12-06 DIAGNOSIS — E785 Hyperlipidemia, unspecified: Secondary | ICD-10-CM

## 2015-12-06 NOTE — Progress Notes (Addendum)
PATIENT: Nichole Walters MRN: [id number]  DOB: Feb 04, 1951   DOV:12/08/2015 PCP: Aretta Nip, MD  Clinic Note: Chief Complaint  Patient presents with  . Shortness of Breath    a little    HPI: Nichole Walters is a 65 y.o.  female with a PMH below who presents today for annual cardiology followup.  She has history of palpitations, hypertension and hyperlipidemia. She also has a family history of CAD  She was last seen in May 2016. Was doing well at that time. Palpitations were noted to be essentially nonexistent at that time. She had hysteroscopy procedure in May but otherwise no hospitalizations. No recent studies to review.  Interval History: She presents today feeling well.  She gets a little bit of exertional dyspnea but is here more to discuss her "Lifetime Screening" that was done with carotid Dopplers as well as risk assessment. She was read as being high risk for heart attack and coronary disease etc. She is quite concerned.  Over the last year she is getting moving out of her exercise routine, and gained some weight. She is now trying to get back into an exercise routine and diet adjustment some, partially scared because of this screening test was done. She really only notes a little bit of exertional dyspnea because of deconditioning but denies any dyspnea with routine activity. No resting or exertional chest tightness/pressure.  No PND, orthopnea or edema.  Minimal palpitations, with No lightheadedness, dizziness, weakness or syncope/near syncope. No TIA/amaurosis fugax symptoms.  Past Medical History:  Diagnosis Date  . Anxiety    no meds  . Dyslipidemia 05/08/2013  . Essential hypertension 05/08/2013  . Palpitations 05/08/2013   Hx   . SVD (spontaneous vaginal delivery)    x 1    Prior Cardiac Evaluation   Procedure Laterality Date  . Transthoracic echocardiogram  December 2010    Normal LV size and function. Mild MR and TR. Otherwise normal. Normal diastolic function.   Marland Kitchen Nm persantine myoview ltd  December 2010    EF> 70%. No ischemia or infarction   Allergies  Allergen Reactions  . Celexa [Citalopram] Swelling    Mouth swells    Current Outpatient Prescriptions  Medication Sig Dispense Refill  . amLODipine (NORVASC) 5 MG tablet Take 1 tablet (5 mg total) by mouth daily. 90 tablet 3  . Biotin 2500 MCG CAPS Take 1 capsule by mouth daily.    . calcium carbonate (CALTRATE 600) 1500 (600 Ca) MG TABS tablet Take 600 mg of elemental calcium by mouth 2 (two) times daily with a meal.    . cholecalciferol (VITAMIN D) 1000 units tablet Take 1,000 Units by mouth daily.    . irbesartan-hydrochlorothiazide (AVALIDE) 150-12.5 MG tablet Take 1 tablet by mouth daily. PLEASE CONTACT OFFICE FOR ADDITIONAL REFILLS 90 tablet 0  . latanoprost (XALATAN) 0.005 % ophthalmic solution Place 1 drop into both eyes at bedtime.    Marland Kitchen OVER THE COUNTER MEDICATION Take 2 capsules by mouth 3 (three) times daily. Ligaplex II 5300 - herbal supplement    . pravastatin (PRAVACHOL) 20 MG tablet Take 1 tablet (20 mg total) by mouth daily. 90 tablet 3   No current facility-administered medications for this visit.    Social History   Social History Narrative   Single mother of one, grandmother 69, great-grandmother 2.   Does not smoke, does not drink.   Exercises usually on treadmill mostly 3 days a week.   History reviewed. No pertinent family  history.  Paternal Aunts & uncles had CVAs, PGM- CVA  ROS: A comprehensive Review of Systems - was performed  Review of Systems  Constitutional: Negative for weight loss (with busy work schedule she has not been exercising much and has gained some weight.).  HENT: Negative for nosebleeds.        No allergy problems  Respiratory: Negative for cough and shortness of breath.   Cardiovascular: Negative for claudication.  Gastrointestinal: Negative for blood in stool, constipation and heartburn.  Genitourinary: Negative for hematuria.    Endo/Heme/Allergies: Does not bruise/bleed easily.  Psychiatric/Behavioral: The patient is nervous/anxious (Much less stressful now.).        Finally at the end of the 9 months rigorous schedule, starting to relax now - back to normal schedule & hopes to get back exercising.  All other systems reviewed and are negative.   Wt Readings from Last 3 Encounters:  12/06/15 61.8 kg (136 lb 3.2 oz)  07/11/15 59.4 kg (131 lb)  07/06/14 59.9 kg (132 lb 1.6 oz)    PHYSICAL EXAM BP 130/60   Pulse 68   Ht 5' (1.524 m)   Wt 61.8 kg (136 lb 3.2 oz)   SpO2 99%   BMI 26.60 kg/m  General appearance: alert, cooperative, appears stated age, no distress and Well-nourished and well-groomed. Normal mood and affect. Neck: no adenopathy, no carotid bruit, no JVD, supple, symmetrical, trachea midline and thyroid not enlarged, symmetric, no tenderness/mass/nodules Lungs: clear to auscultation bilaterally, normal percussion bilaterally and Nonlabored, good air movement. Heart: RRR, normal S1 and S2. Soft 1/6 at most at this time her to the apex. No other R/G. Nondisplaced PMI. Abdomen: soft, non-tender; bowel sounds normal; no masses,  no organomegaly Extremities: extremities normal, atraumatic, no cyanosis or edema, no edema, redness or tenderness in the calves or thighs and no ulcers, gangrene or trophic changes Pulses: 2+ and symmetric Neurologic: Alert and oriented X 3, normal strength and tone. Normal symmetric reflexes. 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 -- ?? HEART RISK ASSESSMENT  10 Yr: MODERATE   ASSESSMENT / PLAN: Pretty stable from cardiac standpoint. No real active cardiac symptoms. She is concerned about weight gain, but still remains below the cutoff for obesity. Continue to  recommend exercise. We reviewed her screening tests. I reassured her that these projections would be based on her having risk factors that are not adequately treated. Her risk factors are treated and she should not be at the wrist indicated on this test.  Problem List Items Addressed This Visit    Palpitations (Chronic)    Stable and, notably improved. Not on beta blocker. I think this is more stress related and benign.      Essential hypertension (Chronic)    Excellent control on current medication combination of ARB/HCTZ and amlodipine. No change needed.      Dyslipidemia (Chronic)    On pravastatin. Labs followed by PCP. Goal LDL would be less <130.       Other Visit Diagnoses   None.     Although very simple evaluation, I did spend at least 25 minutes reviewing her outside studies and discussing the road results and ramifications of them. Greater than 50% of the time was spent in direct counseling.   Followup: One year  Dantae Meunier W. Ellyn Hack, M.D., M.S. Interventional Cardiology [facility]

## 2015-12-06 NOTE — Patient Instructions (Addendum)
NO CHANGES WITH CURRENT MEDICATIONS   Your physician wants you to follow-up in: 12  Months with Dr Ellyn Hack.You will receive a reminder letter in the mail two months in advance. If you don't receive a letter, please call our office to schedule the follow-up appointment.   If you need a refill on your cardiac medications before your next appointment, please call your pharmacy.

## 2015-12-08 ENCOUNTER — Encounter: Payer: Self-pay | Admitting: Cardiology

## 2015-12-08 NOTE — Assessment & Plan Note (Signed)
On pravastatin. Labs followed by PCP. Goal LDL would be less <130.

## 2015-12-08 NOTE — Assessment & Plan Note (Signed)
Excellent control on current medication combination of ARB/HCTZ and amlodipine. No change needed.

## 2015-12-08 NOTE — Assessment & Plan Note (Signed)
Stable and, notably improved. Not on beta blocker. I think this is more stress related and benign.

## 2016-01-05 ENCOUNTER — Other Ambulatory Visit: Payer: Self-pay | Admitting: Cardiology

## 2016-01-06 NOTE — Telephone Encounter (Signed)
Rx request sent to pharmacy.  

## 2016-01-22 ENCOUNTER — Telehealth: Payer: Self-pay | Admitting: Cardiology

## 2016-01-22 NOTE — Telephone Encounter (Signed)
New message  Pt call  Pt calling for something RX mild or OTC med for sleep  Please call pt and advise

## 2016-01-22 NOTE — Telephone Encounter (Signed)
Pt notified of OTC options for sleep, directed if these do not work to call PCP

## 2016-02-05 ENCOUNTER — Other Ambulatory Visit: Payer: Self-pay | Admitting: *Deleted

## 2016-02-05 MED ORDER — AMLODIPINE BESYLATE 5 MG PO TABS: 5.0000 mg | ORAL_TABLET | Freq: Every day | ORAL | 3 refills | Status: DC

## 2016-02-05 NOTE — Telephone Encounter (Signed)
Rx(s) sent to pharmacy electronically.  

## 2016-04-04 ENCOUNTER — Other Ambulatory Visit: Payer: Self-pay | Admitting: Cardiology

## 2016-04-06 NOTE — Telephone Encounter (Signed)
Rx(s) sent to pharmacy electronically.  

## 2016-07-13 ENCOUNTER — Ambulatory Visit: Payer: BLUE CROSS/BLUE SHIELD | Admitting: Family Medicine

## 2016-07-16 LAB — LIPID PANEL
Cholesterol: 194 (ref 0–200)
HDL: 63 (ref 35–70)
LDL CALC: 110
TRIGLYCERIDES: 106 (ref 40–160)

## 2016-07-16 LAB — CBC AND DIFFERENTIAL
HEMATOCRIT: 40 (ref 36–46)
HEMOGLOBIN: 12.6 (ref 12.0–16.0)
PLATELETS: 238 (ref 150–399)
WBC: 5.5

## 2016-07-16 LAB — BASIC METABOLIC PANEL
BUN: 11 (ref 4–21)
Creatinine: 0.7 (ref 0.5–1.1)
GLUCOSE: 92
Potassium: 4.2 (ref 3.4–5.3)
SODIUM: 141 (ref 137–147)

## 2016-07-16 LAB — TSH: TSH: 2.21 (ref 0.41–5.90)

## 2016-07-16 LAB — VITAMIN B12: VITAMIN B 12: 1773

## 2016-07-16 LAB — HEMOGLOBIN A1C: Hemoglobin A1C: 5.4

## 2016-07-17 ENCOUNTER — Ambulatory Visit (INDEPENDENT_AMBULATORY_CARE_PROVIDER_SITE_OTHER): Payer: BLUE CROSS/BLUE SHIELD | Admitting: Family Medicine

## 2016-07-17 ENCOUNTER — Encounter: Payer: Self-pay | Admitting: Family Medicine

## 2016-07-17 VITALS — BP 106/70 | HR 64 | Resp 12 | Ht 60.0 in | Wt 135.5 lb

## 2016-07-17 DIAGNOSIS — G5762 Lesion of plantar nerve, left lower limb: Secondary | ICD-10-CM

## 2016-07-17 DIAGNOSIS — E785 Hyperlipidemia, unspecified: Secondary | ICD-10-CM

## 2016-07-17 DIAGNOSIS — Z23 Encounter for immunization: Secondary | ICD-10-CM | POA: Diagnosis not present

## 2016-07-17 DIAGNOSIS — M79672 Pain in left foot: Secondary | ICD-10-CM

## 2016-07-17 DIAGNOSIS — I10 Essential (primary) hypertension: Secondary | ICD-10-CM | POA: Diagnosis not present

## 2016-07-17 NOTE — Progress Notes (Signed)
HPI:   [name] is a 66 y.o. female, who is here today to establish care.  Former PCP: Dr Radene Ou.  Last preventive routine visit: 07/2015 Colonoscopy 07/2015. Labs done by her gyn in 03/2016.   Chronic medical problems: HTN, glaucoma, and HLD among some. According to pt, she had labs done by gyn and they were "fine."  HLD: She is on Pravastatin 20 mg daily and follows low fat diet.  HTN:  Since 2015. On Amlodipine 5 mg and Avalide 150-12.5 mg. She does not monitor BP at home. Follows with ophthalmologists q 4 months.  Denies severe/frequent headache, visual changes, chest pain, dyspnea, palpitation, claudication, focal weakness, or edema.  She follows with cardiologists annually. She denies Hx of CAD, states that she establish with cardiologists initially necasue she was having chest discomfort later on she realized it was anxiety related.   Concerns today:   Left foot that is "bothering" her. She remembers having a leg longer and was recommended to wear a wedge in her shoe, which she did not do. She wonders if this is related to her back, reporting Hx of lower back pain with radiation.  According to pt,she was supposed to have lumbar surgery in 2009, she was having severe pain left lower back radiated to LLE and resolved after starting OTC supplementation:Ligaplex II  A year of dorsal left foot pain 4-5 tarsal and plantar. No Hx of injury, edema,or erythema.  Exacerbated by wearing certain type of shoes and prolonged walking. Alleviated by rest. Pain is sharp/achy max 8/10. Dull and stiff "numb" sensation in between 3rd and 4th toe. Sharp pain on dorsum. No rash or edema on area, fever, chills, or abnormal wt loss.   Review of Systems  Constitutional: Negative for activity change, appetite change, fatigue, fever and unexpected weight change.  HENT: Negative for mouth sores, nosebleeds and trouble swallowing.   Eyes: Negative for redness and  visual disturbance.  Respiratory: Negative for cough, shortness of breath and wheezing.   Cardiovascular: Negative for chest pain, palpitations and leg swelling.  Gastrointestinal: Negative for abdominal pain, nausea and vomiting.       Negative for changes in bowel habits.  Endocrine: Negative for polydipsia, polyphagia and polyuria.  Genitourinary: Negative for decreased urine volume and hematuria.  Musculoskeletal: Positive for arthralgias. Negative for back pain and gait problem.  Skin: Negative for color change, rash and wound.  Neurological: Negative for syncope, weakness and headaches.  Psychiatric/Behavioral: Negative for confusion. The patient is not nervous/anxious.       Current Outpatient Prescriptions on File Prior to Visit  Medication Sig Dispense Refill  . amLODipine (NORVASC) 5 MG tablet Take 1 tablet (5 mg total) by mouth daily. 90 tablet 3  . calcium carbonate (CALTRATE 600) 1500 (600 Ca) MG TABS tablet Take 600 mg of elemental calcium by mouth 2 (two) times daily with a meal.    . cholecalciferol (VITAMIN D) 1000 units tablet Take 1,000 Units by mouth daily.    . irbesartan-hydrochlorothiazide (AVALIDE) 150-12.5 MG tablet TAKE 1 TABLET BY MOUTH DAILY. 90 tablet 2  . latanoprost (XALATAN) 0.005 % ophthalmic solution Place 1 drop into both eyes at bedtime.    Marland Kitchen OVER THE COUNTER MEDICATION Take 2 capsules by mouth 3 (three) times daily. Ligaplex II 5300 - herbal supplement    . pravastatin (PRAVACHOL) 20 MG tablet Take 1 tablet (20 mg total) by mouth daily. 90 tablet 3   No current facility-administered medications on  file prior to visit.      Past Medical History:  Diagnosis Date  . Anxiety    no meds  . Dyslipidemia 05/08/2013  . Essential hypertension 05/08/2013  . Palpitations 05/08/2013   Hx   . SVD (spontaneous vaginal delivery)    x 1   Allergies  Allergen Reactions  . Celexa [Citalopram] Swelling    Mouth swells    No family history on file.  Social  History   Social History  . Marital status: Single    Spouse name: N/A  . Number of children: N/A  . Years of education: N/A   Social History Main Topics  . Smoking status: Never Smoker  . Smokeless tobacco: Never Used  . Alcohol use No  . Drug use: No  . Sexual activity: Yes    Birth control/ protection: Post-menopausal   Other Topics Concern  . None   Social History Narrative   Single mother of one, grandmother 14, great-grandmother 2.   Does not smoke, does not drink.   Exercises usually on treadmill mostly 3 days a week.    Vitals:   07/17/16 1355  BP: 106/70  Pulse: 64  Resp: 12    Body mass index is 26.46 kg/m.   Physical Exam  Nursing note and vitals reviewed. Constitutional: She is oriented to person, place, and time. She appears well-developed and well-nourished. No distress.  HENT:  Head: Atraumatic.  Mouth/Throat: Oropharynx is clear and moist and mucous membranes are normal.  Eyes: Conjunctivae and EOM are normal. Pupils are equal, round, and reactive to light.  Neck: No tracheal deviation present. No thyroid mass and no thyromegaly present.  Cardiovascular: Normal rate and regular rhythm.   No murmur heard. Pulses:      Dorsalis pedis pulses are 2+ on the right side, and 2+ on the left side.  Respiratory: Effort normal and breath sounds normal. No respiratory distress.  GI: Soft. She exhibits no mass. There is no hepatomegaly. There is no tenderness.  Musculoskeletal: She exhibits no edema.       Left foot: There is normal range of motion, no tenderness and no bony tenderness.  No significant deformity appreciated. No tenderness upon palpation of paraspinal muscles. Pain is not elicited with movement on exam table during examination. No local edema or erythema appreciated, no suspicious lesions.   Lymphadenopathy:    She has no cervical adenopathy.  Neurological: She is alert and oriented to person, place, and time. She has normal strength. Gait  normal.  SLR negative bilateral.  Skin: Skin is warm. No rash noted. No erythema.  Psychiatric: She has a normal mood and affect.  Well groomed, good eye contact.      ASSESSMENT AND PLAN:   Sundi was seen today for establish care.  Diagnoses and all orders for this visit:  Foot pain, left  We discussed possible causes. I do not think it is related to back pain (radicular), ? OA. I do not think imaging is needed at this time since there is not a Hx of recent trauma and seems chronic. Podiatry evaluation recommended.  Morton's neuroma of left foot  Plantar pain,"numb" she described suggest Morton neuroma. Podiatry evaluation recommended, explained she does not need a referral. Avoid hard shoes.  Essential hypertension  SBP on lower normal range, recommend monitoring BP at home. She prefers not to have labs today because she is not sure if her insurance will cover No changes in current management. DASH-low salt diet recommended.  Eye exam recommended annually. F/U in 6 months, before if needed.  Dyslipidemia  Reporting FLP done in 03/2016. We will try to obtain copy of lab results. Continue low fat diet and Pravastatin. F/U in 6 months.  Need for Tdap vaccination -     Tdap vaccine greater than or equal to 7yo IM       Javar Eshbach G. [place], MD  [facility]. [place] office.

## 2016-07-17 NOTE — Patient Instructions (Addendum)
A few things to remember from today's visit:   Morton's neuroma of left foot  Essential hypertension  Dyslipidemia  [facility]   Blood pressure goal for most people is less than 140/90. Some populations (older than 60) the goal is less than 150/90.  Most recent cardiologists' recommendations recommend blood pressure at or less than 130/80.   Elevated blood pressure increases the risk of strokes, heart and kidney disease, and eye problems. Regular physical activity and a healthy diet (DASH diet) usually help. Low salt diet. Take medications as instructed.  Caution with some over the counter medications as cold medications, dietary products (for weight loss), and Ibuprofen or Aleve (frequent use);all these medications could cause elevation of blood pressure.    Please be sure medication list is accurate. If a new problem present, please set up appointment sooner than planned today.

## 2016-07-28 ENCOUNTER — Ambulatory Visit (HOSPITAL_BASED_OUTPATIENT_CLINIC_OR_DEPARTMENT_OTHER)
Admission: RE | Admit: 2016-07-28 | Discharge: 2016-07-28 | Disposition: A | Discharge status: Home / Self Care | Payer: BLUE CROSS/BLUE SHIELD | Source: Ambulatory Visit | Attending: Podiatry | Admitting: Podiatry

## 2016-07-28 ENCOUNTER — Encounter: Payer: Self-pay | Admitting: Podiatry

## 2016-07-28 ENCOUNTER — Ambulatory Visit (INDEPENDENT_AMBULATORY_CARE_PROVIDER_SITE_OTHER): Payer: BLUE CROSS/BLUE SHIELD | Admitting: Podiatry

## 2016-07-28 ENCOUNTER — Telehealth: Payer: Self-pay | Admitting: *Deleted

## 2016-07-28 DIAGNOSIS — G5762 Lesion of plantar nerve, left lower limb: Secondary | ICD-10-CM

## 2016-07-28 DIAGNOSIS — D3610 Benign neoplasm of peripheral nerves and autonomic nervous system, unspecified: Secondary | ICD-10-CM

## 2016-07-28 NOTE — Telephone Encounter (Signed)
-----   Message from Trula Slade, DPM sent at 07/28/2016  4:13 PM EDT ----- Ultrasound ordered

## 2016-07-28 NOTE — Telephone Encounter (Addendum)
-----   Message from Trula Slade, DPM sent at 07/28/2016  8:47 AM EDT ----- Can you please order an ultrasound of the left foot 3rd interspace to evaluate for neuroma? Can this be [place] at [facility] [place]? Thanks. Orders faxed to [facility] central scheduling. 08/06/2016-Left message for pt to call for results of the [place].08/10/2016-Pt called for [place] results.08/11/2016-I informed pt of Dr. Leigh Aurora review of results, pt states understanding.

## 2016-07-28 NOTE — Progress Notes (Signed)
   Subjective:    Patient ID: Nichole Walters, female    DOB: [birth year], 66 y.o.   MRN: [id number]  HPI   My 45rd and 4th on left foot are separating and have been hurting and I am not a diabetic and I do get cramps and I have some numbness and they are sore and tender and my right foot is doing good     Review of Systems  All other systems reviewed and are negative.      Objective:   Physical Exam        Assessment & Plan:

## 2016-07-28 NOTE — Progress Notes (Signed)
Subjective:    Patient ID: Nichole Walters, female   DOB: 66 y.o.   MRN: [id number]   HPI 66 year old female presents the office today for concerns of left foot pain which has been ongoing for about 1 year. She states that she occasionally gets some "numb" areas and she points the plantar aspect of the foot on the third interspace where she gets her symptoms. She denies recent injury or trauma she's had no significant treatment. She states that when she wears a shoe with more arch support her foot does not hurt as much when she wears a flat sandal is when she gets more of her symptoms. She is also concerned this would be coming from her back. She was told that she had back surgery several years ago but did not do this. She would like to make sure that there is nothing else going on with the foot. She denies any claudication symptoms.   Review of Systems  All other systems reviewed and are negative.       Objective:  Physical Exam General: AAO x3, NAD  Dermatological: Skin is warm, dry and supple bilateral. Nails x 10 are well manicured; remaining integument appears unremarkable at this time. There are no open sores, no preulcerative lesions, no rash or signs of infection present.  Vascular: Dorsalis Pedis artery and Posterior Tibial artery pedal pulses are 2/4 bilateral with immedate capillary fill time. Pedal hair growth present.  There is no pain with calf compression, swelling, warmth, erythema.   Neruologic: Grossly intact via light touch bilateral. Vibratory intact via tuning fork bilateral. Protective threshold with Semmes Wienstein monofilament intact to all pedal sites bilateral. Negative tinel sign.   Musculoskeletal: There is splaying of the third and fourth digits in the left foot. Upon palpation of the interspace unable to palpate a small neuroma or possible bursa to this area. Suggest that she does get numbness and tingling to this area. There is no overlying edema, erythema,  increase in warmth. There is no area pinpoint bony tenderness or pain the vibratory sensation. Mild increase in arch height upon weightbearing. Muscular strength 5/5 in all groups tested bilateral.  Gait: Unassisted, Nonantalgic.      Assessment:     66 year old female left third interspace likely neuroma.    Plan:     -Treatment options discussed including all alternatives, risks, and complications -Etiology of symptoms were discussed -X-rays ordered today. -I discussed with her shoe gear modifications answers. She's instrument over-the-counter insert and I recommended her to go to [employer] for an insert. Discussed what to look for. It seems that she is more improvement in her symptoms with arch support. -Metatarsal ongoing pads were dispensed. -Ordered an ultrasound of the third interspace and left foot to evaluate for neuroma.  -Follow-up in 3 weeks or sooner if needed.  Celesta Gentile, DPM

## 2016-08-05 ENCOUNTER — Ambulatory Visit (HOSPITAL_BASED_OUTPATIENT_CLINIC_OR_DEPARTMENT_OTHER)
Admission: RE | Admit: 2016-08-05 | Discharge: 2016-08-05 | Disposition: A | Discharge status: Home / Self Care | Payer: BLUE CROSS/BLUE SHIELD | Source: Ambulatory Visit | Attending: Podiatry | Admitting: Podiatry

## 2016-08-05 DIAGNOSIS — D3610 Benign neoplasm of peripheral nerves and autonomic nervous system, unspecified: Secondary | ICD-10-CM | POA: Insufficient documentation

## 2016-08-06 ENCOUNTER — Ambulatory Visit: Payer: BLUE CROSS/BLUE SHIELD | Admitting: Podiatry

## 2016-08-06 NOTE — Telephone Encounter (Signed)
-----   Message from Trula Slade, DPM sent at 08/06/2016 12:55 PM EDT ----- U/s negative for neuroma- please let her know. Will discuss at her next appointment.

## 2016-08-10 NOTE — Telephone Encounter (Signed)
The ultrasound was negative.

## 2016-08-18 ENCOUNTER — Ambulatory Visit: Payer: BLUE CROSS/BLUE SHIELD | Admitting: Podiatry

## 2016-08-25 ENCOUNTER — Ambulatory Visit: Payer: BLUE CROSS/BLUE SHIELD | Admitting: Podiatry

## 2016-09-08 ENCOUNTER — Ambulatory Visit (INDEPENDENT_AMBULATORY_CARE_PROVIDER_SITE_OTHER): Payer: BLUE CROSS/BLUE SHIELD | Admitting: Podiatry

## 2016-09-08 ENCOUNTER — Encounter: Payer: Self-pay | Admitting: Podiatry

## 2016-09-08 DIAGNOSIS — M792 Neuralgia and neuritis, unspecified: Secondary | ICD-10-CM | POA: Diagnosis not present

## 2016-09-08 DIAGNOSIS — M779 Enthesopathy, unspecified: Secondary | ICD-10-CM | POA: Diagnosis not present

## 2016-09-09 ENCOUNTER — Encounter: Payer: Self-pay | Admitting: Family Medicine

## 2016-09-09 NOTE — Progress Notes (Signed)
Subjective: 66 year old female presents the office for follow-up evaluation of left foot pain discussed ultrasound results. She states that she has not had any pain to her left foot at this point. She states that she occasionally gets numbness of the foot but this has been ongoing for greater than 1 year. She is concerned this may be, from her back and she was told that she may need have back surgery several years ago. She's having no pain today she denies any swelling or redness and the pain is becoming more intermittent. Denies any systemic complaints such as fevers, chills, nausea, vomiting. No acute changes since last appointment, and no other complaints at this time.   Objective: AAO x3, NAD DP/PT pulses palpable bilaterally, CRT less than 3 seconds At this point there is continued splaying between the third and fourth digits of left foot. There is no tenderness palpation of the interspace and there is no area pinpoint bony tenderness or pain the vibratory sensation. Unable to palpate a neuroma or any other mass within the interspace. There is no overlying edema, erythema, increase in warmth. There is no pain bilaterally. No open lesions or pre-ulcerative lesions.  No pain with calf compression, swelling, warmth, erythema  Assessment: Intermittent left foot pain, neuritis  Plan: -All treatment options discussed with the patient including all alternatives, risks, complications.  -Ultrasound results were discussed the patient which was negative. This point she is having no symptoms therefore we'll hold off on MRI. Discussed treatment options with her however she states that this may be coming from back. I encouraged her to follow-up with her primary care physician for this. If she sits of recurrence of the pain recommend follow-up at this point I'll see her back as needed and she agrees to this plan. -Patient encouraged to call the office with any questions, concerns, change in symptoms.    Nichole Walters, DPM

## 2016-11-09 ENCOUNTER — Other Ambulatory Visit: Payer: Self-pay | Admitting: Cardiology

## 2017-01-18 ENCOUNTER — Ambulatory Visit: Payer: BLUE CROSS/BLUE SHIELD | Admitting: Family Medicine

## 2017-02-11 ENCOUNTER — Other Ambulatory Visit: Payer: Self-pay | Admitting: Cardiology

## 2017-02-11 NOTE — Telephone Encounter (Signed)
Rx(s) sent to pharmacy electronically.  

## 2017-02-16 ENCOUNTER — Telehealth: Payer: Self-pay | Admitting: Cardiology

## 2017-02-16 NOTE — Telephone Encounter (Signed)
Called patient and LVM to call back to schedule her yearly visit with Dr. Ellyn Hack.

## 2017-02-17 ENCOUNTER — Other Ambulatory Visit: Payer: Self-pay | Admitting: Cardiology

## 2017-02-18 ENCOUNTER — Telehealth: Payer: Self-pay | Admitting: Cardiology

## 2017-02-18 NOTE — Telephone Encounter (Signed)
°  New Prob  Has some questions regarding possible medication alternative for pravastatin (PRAVACHOL) 20 MG tablet and possible liver damage. Please call.

## 2017-02-18 NOTE — Telephone Encounter (Signed)
Nichole Walters can review with her.  [place]

## 2017-02-18 NOTE — Telephone Encounter (Signed)
Patient states that she had a liver test with [facility] and it showed that it was abnormal. Patient advised to bring the result to the office so that it can be reviewed by her provider. Patient also advised that she needed an OV. Appt given to patient to be seen on 03/18/17 with [place]. Patient advised to discuss her concerns at this visit. Patient verbalized understanding of plan.

## 2017-02-22 ENCOUNTER — Ambulatory Visit: Payer: BLUE CROSS/BLUE SHIELD | Admitting: Family Medicine

## 2017-02-22 ENCOUNTER — Encounter: Payer: Self-pay | Admitting: Family Medicine

## 2017-02-22 VITALS — BP 110/68 | HR 72 | Temp 98.3°F | Resp 12 | Ht 60.0 in | Wt 138.2 lb

## 2017-02-22 DIAGNOSIS — E785 Hyperlipidemia, unspecified: Secondary | ICD-10-CM

## 2017-02-22 DIAGNOSIS — R740 Nonspecific elevation of levels of transaminase and lactic acid dehydrogenase [LDH]: Secondary | ICD-10-CM | POA: Diagnosis not present

## 2017-02-22 DIAGNOSIS — I10 Essential (primary) hypertension: Secondary | ICD-10-CM | POA: Diagnosis not present

## 2017-02-22 DIAGNOSIS — D649 Anemia, unspecified: Secondary | ICD-10-CM

## 2017-02-22 DIAGNOSIS — E559 Vitamin D deficiency, unspecified: Secondary | ICD-10-CM

## 2017-02-22 DIAGNOSIS — R7401 Elevation of levels of liver transaminase levels: Secondary | ICD-10-CM

## 2017-02-22 LAB — CBC
HEMATOCRIT: 38.1 % (ref 36.0–46.0)
HEMOGLOBIN: 12.3 g/dL (ref 12.0–15.0)
MCHC: 32.2 g/dL (ref 30.0–36.0)
MCV: 83.9 fl (ref 78.0–100.0)
PLATELETS: 207 10*3/uL (ref 150.0–400.0)
RBC: 4.53 Mil/uL (ref 3.87–5.11)
RDW: 14.9 % (ref 11.5–15.5)
WBC: 5.1 10*3/uL (ref 4.0–10.5)

## 2017-02-22 LAB — IBC PANEL
Iron: 65 ug/dL (ref 42–145)
SATURATION RATIOS: 17.6 % — AB (ref 20.0–50.0)
Transferrin: 264 mg/dL (ref 212.0–360.0)

## 2017-02-22 LAB — BASIC METABOLIC PANEL
BUN: 14 mg/dL (ref 6–23)
CHLORIDE: 102 meq/L (ref 96–112)
CO2: 28 mEq/L (ref 19–32)
Calcium: 9.3 mg/dL (ref 8.4–10.5)
Creatinine, Ser: 0.64 mg/dL (ref 0.40–1.20)
GFR: 119.97 mL/min (ref >60.00)
Glucose, Bld: 80 mg/dL (ref 70–99)
POTASSIUM: 3.5 meq/L (ref 3.5–5.1)
SODIUM: 138 meq/L (ref 135–145)

## 2017-02-22 LAB — HEPATIC FUNCTION PANEL
ALK PHOS: 58 U/L (ref 39–117)
ALT: 38 U/L — AB (ref 0–35)
AST: 33 U/L (ref 0–37)
Albumin: 4.2 g/dL (ref 3.5–5.2)
BILIRUBIN DIRECT: 0.2 mg/dL (ref 0.0–0.3)
BILIRUBIN TOTAL: 0.7 mg/dL (ref 0.2–1.2)
Total Protein: 7.9 g/dL (ref 6.0–8.3)

## 2017-02-22 LAB — VITAMIN D 25 HYDROXY (VIT D DEFICIENCY, FRACTURES): VITD: 46.4 ng/mL (ref 30.00–100.00)

## 2017-02-22 NOTE — Progress Notes (Signed)
HPI:  Chief Complaint  Patient presents with  . Discuss medication changes    [name] is a 66 y.o. female, who is here today because she wants to discuss lab results she recently had done through [facility]. She starts visit stating that "I am in trouble." She also would like to discuss some of her medications.  Last seen Jul 17, 2016.  On 01/26/17 she underwent FLP,LFT's, CRP,abdominal US,ABI,EKG,carotid US,DEXA among some for screening through private company. All labs and test otherwise normal but LFT's abnormal.She was instructed to follow with PCP.   She thinks some of her meds are causing this problem.  She denies high alcohol consumption.  Denies abdominal pain, nausea, vomiting, changes in bowel habits, jaundice, color changes in stool/urine AST 62 and ALT 49.  Hx of anemia, last CBC was in normal range. FHx of liver disease but secondary to alcoholism (her father) and metastatic lesions (brother). She is not aware of Hx of hemochromatosis.  Hx of HLD, she is on Pravastatin 20 mg daily. She has not been consistent with low fat diet and has not followed as recommended.  Lab Results  Component Value Date   CHOL 194 07/16/2016   HDL 63 07/16/2016   LDLCALC 110 07/16/2016   TRIG 106 07/16/2016    01/26/17 TC 158,HDL 65,TG 158, and LDL 81.  HTN: She is currently on Amlodipine. She is concerned about her renal function, states that it has" never been checked" She denies gross hematuria,foam in urine or decreased urine output.  Denies severe/frequent headache, visual changes, chest pain, dyspnea, palpitation, claudication, focal weakness, or edema.  Vit D deficiency: She is on OTC Vit D 1000 U daily.   Review of Systems  Constitutional: Negative for activity change, appetite change, fatigue, fever and unexpected weight change.  HENT: Negative for mouth sores, nosebleeds and trouble swallowing.   Eyes: Negative for redness and  visual disturbance.  Respiratory: Negative for cough, shortness of breath and wheezing.   Cardiovascular: Negative for chest pain, palpitations and leg swelling.  Gastrointestinal: Negative for abdominal pain, blood in stool, nausea and vomiting.       Negative for changes in bowel habits.  Endocrine: Negative for cold intolerance, heat intolerance, polydipsia, polyphagia and polyuria.  Genitourinary: Negative for decreased urine volume, dysuria and hematuria.  Musculoskeletal: Negative for gait problem and myalgias.  Skin: Negative for pallor, rash and wound.  Neurological: Negative for syncope, weakness and headaches.  Hematological: Negative for adenopathy. Does not bruise/bleed easily.  Psychiatric/Behavioral: Negative for confusion. The patient is nervous/anxious.       Current Outpatient Medications on File Prior to Visit  Medication Sig Dispense Refill  . amLODipine (NORVASC) 5 MG tablet Take 1 tablet (5 mg total) by mouth daily. PLEASE CONTACT OFFICE FOR ADDITIONAL REFILLS 30 tablet 0  . calcium carbonate (CALTRATE 600) 1500 (600 Ca) MG TABS tablet Take 600 mg of elemental calcium by mouth 2 (two) times daily with a meal.    . cholecalciferol (VITAMIN D) 1000 units tablet Take 1,000 Units by mouth daily.    . irbesartan-hydrochlorothiazide (AVALIDE) 150-12.5 MG tablet Take 1 tablet by mouth daily. PLEASE CONTACT OFFICE FOR ADDITIONAL REFILLS 30 tablet 0  . latanoprost (XALATAN) 0.005 % ophthalmic solution Place 1 drop into both eyes at bedtime.    Marland Kitchen OVER THE COUNTER MEDICATION Take 2 capsules by mouth 3 (three) times daily. Ligaplex II 5300 - herbal supplement  No current facility-administered medications on file prior to visit.      Past Medical History:  Diagnosis Date  . Anxiety    no meds  . Dyslipidemia 05/08/2013  . Essential hypertension 05/08/2013  . Palpitations 05/08/2013   Hx   . SVD (spontaneous vaginal delivery)    x 1   Allergies  Allergen Reactions  .  Celexa [Citalopram] Swelling    Mouth swells    Social History   Socioeconomic History  . Marital status: Single    Spouse name: None  . Number of children: None  . Years of education: None  . Highest education level: None  Social Needs  . Financial resource strain: None  . Food insecurity - worry: None  . Food insecurity - inability: None  . Transportation needs - medical: None  . Transportation needs - non-medical: None  Occupational History  . None  Tobacco Use  . Smoking status: Never Smoker  . Smokeless tobacco: Never Used  Substance and Sexual Activity  . Alcohol use: No  . Drug use: No  . Sexual activity: Yes    Birth control/protection: Post-menopausal  Other Topics Concern  . None  Social History Narrative   Single mother of one, grandmother 42, great-grandmother 2.   Does not smoke, does not drink.   Exercises usually on treadmill mostly 3 days a week.    Vitals:   02/22/17 0836  BP: 110/68  Pulse: 72  Resp: 12  Temp: 98.3 F (36.8 C)  SpO2: 97%   Body mass index is 27 kg/m.   Physical Exam  Nursing note and vitals reviewed. Constitutional: She is oriented to person, place, and time. She appears well-developed. No distress.  HENT:  Head: Normocephalic and atraumatic.  Mouth/Throat: Oropharynx is clear and moist and mucous membranes are normal.  Eyes: Conjunctivae and EOM are normal. Pupils are equal, round, and reactive to light.  Neck: No tracheal deviation present. No thyroid mass and no thyromegaly present.  Cardiovascular: Normal rate and regular rhythm.  No murmur heard. Pulses:      Dorsalis pedis pulses are 2+ on the right side, and 2+ on the left side.  Respiratory: Effort normal and breath sounds normal. No respiratory distress.  GI: Soft. She exhibits no mass. There is no hepatomegaly. There is no tenderness.  Musculoskeletal: She exhibits no edema or tenderness.  Lymphadenopathy:    She has no cervical adenopathy.  Neurological:  She is alert and oriented to person, place, and time. She has normal strength. Coordination and gait normal.  Skin: Skin is warm. No rash noted. No erythema.  Psychiatric: Her mood appears anxious.  Well groomed, good eye contact.     ASSESSMENT AND PLAN:   [name] was seen today for discuss medication changes.  Diagnoses and all orders for this visit:  Lab Results  Component Value Date   ALT 38 (H) 02/22/2017   AST 33 02/22/2017   ALKPHOS 58 02/22/2017   BILITOT 0.7 02/22/2017   Lab Results  Component Value Date   CREATININE 0.64 02/22/2017   BUN 14 02/22/2017   NA 138 02/22/2017   K 3.5 02/22/2017   CL 102 02/22/2017   CO2 28 02/22/2017   Lab Results  Component Value Date   WBC 5.1 02/22/2017   HGB 12.3 02/22/2017   HCT 38.1 02/22/2017   MCV 83.9 02/22/2017   PLT 207.0 02/22/2017    Elevated transaminase level  AST > ALT. We discussed possible etiologies. She mentions  that prior to lab she did exercise longer than usual. Denies alcohol abuse. Further recommendations will be given according to labs results.  -     Hepatic function panel -     Hepatitis B surface antibody -     Hepatitis B surface antigen -     Hepatitis C antibody -     CBC -     Hep A Antibody, Total  Essential hypertension  Adequately controlled. No changes in current management. DASH diet recommended. Eye exam recommended annually. F/U in 4 months, before if needed.  -     Basic metabolic panel  Dyslipidemia  She would like to discontinue Pravastatin and try non pharmacologic treatment. We discussed some side effects of statin meds as well as benefits F/U in 4-5 months.  Vitamin D deficiency, unspecified  No changes in current management, will follow labs done today and will give further recommendations accordingly.  -     VITAMIN D 25 Hydroxy (Vit-D Deficiency, Fractures)  Anemia, unspecified type  Further recommendations will be given according to lab results.  -      IBC panel  Other orders -     EXTRA LAV TOP TUBE        Return in about 4 months (around 06/23/2017) for HTN,HLD.     -[name] was advised to seek immediate medical attention if sudden worsening symptoms or to follow if they persist or if new concerns arise.       Betty G. Martinique, MD  [facility]. [place] office.

## 2017-02-22 NOTE — Patient Instructions (Addendum)
A few things to remember from today's visit:   Elevated transaminase level - Plan: Hepatic function panel, Hepatitis B surface antibody, Hepatitis B surface antigen, Hepatitis C antibody, CBC, Hep A Antibody, Total  Essential hypertension - Plan: Basic metabolic panel  Dyslipidemia  Vitamin D deficiency, unspecified - Plan: VITAMIN D 25 Hydroxy (Vit-D Deficiency, Fractures)  Anemia, unspecified type - Plan: IBC panel  Continue low fat diet.   Fat and Cholesterol Restricted Diet Getting too much fat and cholesterol in your diet may cause health problems. Following this diet helps keep your fat and cholesterol at normal levels. This can keep you from getting sick. What types of fat should I choose?  Choose monosaturated and polyunsaturated fats. These are found in foods such as olive oil, canola oil, flaxseeds, walnuts, almonds, and seeds.  Eat more omega-3 fats. Good choices include salmon, mackerel, sardines, tuna, flaxseed oil, and ground flaxseeds.  Limit saturated fats. These are in animal products such as meats, butter, and cream. They can also be in plant products such as palm oil, palm kernel oil, and coconut oil.  Avoid foods with partially hydrogenated oils in them. These contain trans fats. Examples of foods that have trans fats are stick margarine, some tub margarines, cookies, crackers, and other baked goods. What general guidelines do I need to follow?  Check food labels. Look for the words "trans fat" and "saturated fat."  When preparing a meal: ? Fill half of your plate with vegetables and green salads. ? Fill one fourth of your plate with whole grains. Look for the word "whole" as the first word in the ingredient list. ? Fill one fourth of your plate with lean protein foods.  Eat more foods that have fiber, like apples, carrots, beans, peas, and barley.  Eat more home-cooked foods. Eat less at restaurants and buffets.  Limit or avoid alcohol.  Limit foods high  in starch and sugar.  Limit fried foods.  Cook foods without frying them. Baking, boiling, grilling, and broiling are all great options.  Lose weight if you are overweight. Losing even a small amount of weight can help your overall health. It can also help prevent diseases such as diabetes and heart disease. What foods can I eat? Grains Whole grains, such as whole wheat or whole grain breads, crackers, cereals, and pasta. Unsweetened oatmeal, bulgur, barley, quinoa, or brown rice. Corn or whole wheat flour tortillas. Vegetables Fresh or frozen vegetables (raw, steamed, roasted, or grilled). Green salads. Fruits All fresh, canned (in natural juice), or frozen fruits. Meat and Other Protein Products Ground beef (85% or leaner), grass-fed beef, or beef trimmed of fat. Skinless chicken or [place]. Ground chicken or [place]. Pork trimmed of fat. All fish and seafood. Eggs. Dried beans, peas, or lentils. Unsalted nuts or seeds. Unsalted canned or dry beans. Dairy Low-fat dairy products, such as skim or 1% milk, 2% or reduced-fat cheeses, low-fat ricotta or cottage cheese, or plain low-fat yogurt. Fats and Oils Tub margarines without trans fats. Light or reduced-fat mayonnaise and salad dressings. Avocado. Olive, canola, sesame, or safflower oils. Natural peanut or almond butter (choose ones without added sugar and oil). The items listed above may not be a complete list of recommended foods or beverages. Contact your dietitian for more options. What foods are not recommended? Grains White bread. White pasta. White rice. Cornbread. Bagels, pastries, and croissants. Crackers that contain trans fat. Vegetables White potatoes. Corn. Creamed or fried vegetables. Vegetables in a cheese sauce. Fruits Dried fruits.  Canned fruit in light or heavy syrup. Fruit juice. Meat and Other Protein Products Fatty cuts of meat. Ribs, chicken wings, bacon, sausage, bologna, salami, chitterlings, fatback, hot dogs,  bratwurst, and packaged luncheon meats. Liver and organ meats. Dairy Whole or 2% milk, cream, half-and-half, and cream cheese. Whole milk cheeses. Whole-fat or sweetened yogurt. Full-fat cheeses. Nondairy creamers and whipped toppings. Processed cheese, cheese spreads, or cheese curds. Sweets and Desserts Corn syrup, sugars, honey, and molasses. Candy. Jam and jelly. Syrup. Sweetened cereals. Cookies, pies, cakes, donuts, muffins, and ice cream. Fats and Oils Butter, stick margarine, lard, shortening, ghee, or bacon fat. Coconut, palm kernel, or palm oils. Beverages Alcohol. Sweetened drinks (such as sodas, lemonade, and fruit drinks or punches). The items listed above may not be a complete list of foods and beverages to avoid. Contact your dietitian for more information. This information is not intended to replace advice given to you by your health care provider. Make sure you discuss any questions you have with your health care provider. Document Released: 08/18/2011 Document Revised: 10/24/2015 Document Reviewed: 05/18/2013 Elsevier Interactive Patient Education  [employer].  Please be sure medication list is accurate. If a new problem present, please set up appointment sooner than planned today.

## 2017-02-23 LAB — HEPATITIS C ANTIBODY
Hepatitis C Ab: NONREACTIVE
SIGNAL TO CUT-OFF: 0.02 (ref <1.00)

## 2017-02-23 LAB — EXTRA LAV TOP TUBE

## 2017-02-23 LAB — HEPATITIS A ANTIBODY, TOTAL: Hepatitis A AB,Total: NONREACTIVE

## 2017-02-23 LAB — HEPATITIS B SURFACE ANTIBODY,QUALITATIVE: Hep B S Ab: NONREACTIVE

## 2017-02-23 LAB — HEPATITIS B SURFACE ANTIGEN: Hepatitis B Surface Ag: NONREACTIVE

## 2017-02-25 ENCOUNTER — Telehealth: Payer: Self-pay | Admitting: Cardiology

## 2017-02-25 NOTE — Telephone Encounter (Signed)
LEFT MESSAGE ON VOICE AND HOME PHONE TO CALL BACK- IN REGARDS  DISCUSSING TO KEEPING APPOINTMENT   ON 03/18/17

## 2017-02-25 NOTE — Telephone Encounter (Signed)
New message    Patient states she no longer taking Pravastatin, per Dr Betty Martinique. Wants to confirm if she needs to keep appt on 1/17 with Kerin Ransom. Please advise

## 2017-02-26 ENCOUNTER — Telehealth: Payer: Self-pay | Admitting: Family Medicine

## 2017-02-26 NOTE — Telephone Encounter (Signed)
Copied from [place]. Topic: Quick Communication - Lab Results >> Feb 26, 2017  8:55 AM Milford Cage, RMA wrote: Called patient to inform them of  lab results. When patient returns call, triage nurse may disclose results. >> Feb 26, 2017 12:11 PM Dimple Nanas, RN wrote: Please route result note to [facility]

## 2017-02-26 NOTE — Telephone Encounter (Signed)
Patient notified of lab results- she will call back to schedule vaccine.

## 2017-03-01 NOTE — Telephone Encounter (Signed)
LEFT MESSAGE AT BOTH NUMBERS TO CALL BACK ON 03/04/17

## 2017-03-04 NOTE — Telephone Encounter (Signed)
Left message to cal back 

## 2017-03-05 ENCOUNTER — Telehealth: Payer: Self-pay | Admitting: Cardiology

## 2017-03-05 NOTE — Telephone Encounter (Signed)
New Message   Patient is calling to see if she needs to keep her appointment. Please call.

## 2017-03-05 NOTE — Telephone Encounter (Signed)
Returned the call to the patient. She stated that she would like to cancel her appointment with Kerin Ransom, PA. She was offered an appointment with Dr. Ellyn Hack in February but she declined stating that she is switching medical plans and will have to wait until this has been finalized. She will call back later when this has been done.

## 2017-03-05 NOTE — Telephone Encounter (Signed)
SE NOTE FROM 03/05/17

## 2017-03-08 ENCOUNTER — Telehealth: Payer: Self-pay | Admitting: Family Medicine

## 2017-03-08 NOTE — Telephone Encounter (Signed)
Pt calling to get clarification of what vaccinations were needed and how soon but would need to be seen in the office. Pt informed that according to the notes of Dr. Martinique on 12/26, Hep A and B vaccinations were needed because "Hep B ,C,A screening negative. Hep B ab NR, so she needs vaccination for Hep B and A". Time not specified of how soon she needed to come in to have vaccinations. Pt verbalized understanding. Pt scheduled for 1/8 to have vaccinations. Sunday Spillers, called at [facility] office to verify if an order for the vaccinations was need before scheduling appt. Sunday Spillers states an order was not needed before scheduling appt.

## 2017-03-09 ENCOUNTER — Ambulatory Visit (INDEPENDENT_AMBULATORY_CARE_PROVIDER_SITE_OTHER): Payer: BLUE CROSS/BLUE SHIELD | Admitting: *Deleted

## 2017-03-09 DIAGNOSIS — Z23 Encounter for immunization: Secondary | ICD-10-CM

## 2017-03-09 NOTE — Progress Notes (Signed)
Per orders of Dr. Betty Martinique, injection of Twinrix given by Dorrene German. Patient tolerated injection well.  Patient instructed to return in 1 month for second dose and 6 months for third dose.  Dorrene German, RN

## 2017-03-17 ENCOUNTER — Other Ambulatory Visit: Payer: Self-pay | Admitting: Cardiology

## 2017-03-18 ENCOUNTER — Ambulatory Visit: Payer: BLUE CROSS/BLUE SHIELD | Admitting: Cardiology

## 2017-03-24 ENCOUNTER — Telehealth: Payer: Self-pay | Admitting: Family Medicine

## 2017-03-24 NOTE — Telephone Encounter (Signed)
Noted that the Amlodipine and Irbesartan HCTZ have been managed by Dr. Ellyn Hack.  Attempted to call the pt.  Left voice message and advised pt. to contact Dr. Allison Quarry office for refill request.  Will send this to Dr. Martinique to make her aware.

## 2017-03-24 NOTE — Telephone Encounter (Unsigned)
Copied from [place] [phone]. >> Mar 24, 2017  3:15 PM Neva Seat wrote: Maeola Harman HCZ  150-12.5 - 10 on hand  Amloditine Besylage 5 mg - 2 on hand   [facility] - Phone: [phone]

## 2017-03-24 NOTE — Telephone Encounter (Unsigned)
Copied from [place]. Topic: General - Other >> Mar 24, 2017  3:15 PM Neva Seat wrote: Maeola Harman HCZ  150-12.5 - 10 on hand  Amloditine Besylage 5 mg - 2 on hand   [facility] - Phone: [phone]

## 2017-03-26 ENCOUNTER — Telehealth: Payer: Self-pay | Admitting: Cardiology

## 2017-03-26 MED ORDER — AMLODIPINE BESYLATE 5 MG PO TABS: 5.0000 mg | ORAL_TABLET | Freq: Every day | ORAL | 0 refills | Status: DC

## 2017-03-26 MED ORDER — IRBESARTAN-HYDROCHLOROTHIAZIDE 150-12.5 MG PO TABS: 1.0000 | ORAL_TABLET | Freq: Every day | ORAL | 0 refills | Status: DC

## 2017-03-26 NOTE — Telephone Encounter (Signed)
Rx has been sent to the pharmacy electronically. 30 days [facility] need [facility]t before further refills

## 2017-03-26 NOTE — Telephone Encounter (Signed)
New message     *STAT* If patient is at the pharmacy, call can be transferred to refill team.   1. Which medications need to be refilled? (please list name of each medication and dose if known) amLODipine (NORVASC) 5 MG tablet and irbesartan-hydrochlorothiazide (AVALIDE) 150-12.5 MG tablet  2. Which pharmacy/location (including street and city if local pharmacy) is [id number] [place] [place]   3. Do they need a 30 day or 90 day supply? [place]

## 2017-04-05 ENCOUNTER — Telehealth: Payer: Self-pay | Admitting: Cardiology

## 2017-04-05 NOTE — Telephone Encounter (Signed)
Spoke with patient and advised [facility], verbalized understanding

## 2017-04-05 NOTE — Telephone Encounter (Signed)
New message   Pt c/o medication issue:  1. Name of Medication: irbesartan-hydrochlorothiazide (AVALIDE) 150-12.5 MG tablet  2. How are you currently taking this medication (dosage and times per day)? As prescribed  3. Are you having a reaction (difficulty breathing--STAT)? no  4. What is your medication issue? Received recall letter from pharmacy

## 2017-04-07 LAB — HM MAMMOGRAPHY

## 2017-04-09 ENCOUNTER — Encounter: Payer: Self-pay | Admitting: Family Medicine

## 2017-04-12 ENCOUNTER — Ambulatory Visit (INDEPENDENT_AMBULATORY_CARE_PROVIDER_SITE_OTHER): Payer: BLUE CROSS/BLUE SHIELD | Admitting: Family Medicine

## 2017-04-12 DIAGNOSIS — Z23 Encounter for immunization: Secondary | ICD-10-CM

## 2017-05-24 ENCOUNTER — Other Ambulatory Visit: Payer: Self-pay | Admitting: Cardiology

## 2017-06-16 ENCOUNTER — Other Ambulatory Visit: Payer: Self-pay | Admitting: Cardiology

## 2017-06-16 NOTE — Telephone Encounter (Signed)
Rx(s) sent to pharmacy electronically.  

## 2017-06-17 ENCOUNTER — Telehealth: Payer: Self-pay | Admitting: *Deleted

## 2017-06-17 ENCOUNTER — Telehealth: Payer: Self-pay | Admitting: Cardiology

## 2017-06-17 NOTE — Telephone Encounter (Signed)
Left message for patient to call and schedule yearly visit with Dr. Ellyn Hack or APP

## 2017-06-17 NOTE — Telephone Encounter (Signed)
Called patient and LVM to call back to schedule overdue yearly followup with Dr. Ellyn Hack.

## 2017-06-21 ENCOUNTER — Telehealth: Payer: Self-pay | Admitting: Family Medicine

## 2017-06-21 NOTE — Telephone Encounter (Signed)
Message sent to Dr. Jordan for FYI. 

## 2017-06-21 NOTE — Telephone Encounter (Signed)
Please schedule follow up appt. Thanks, BJ

## 2017-06-21 NOTE — Telephone Encounter (Signed)
Dyslipidemia  She would like to discontinue Pravastatin and try non pharmacologic treatment. We discussed some side effects of statin meds as well as benefits F/U in 4-5 months. TAKEN FROM OV ON 02/22/17

## 2017-06-21 NOTE — Telephone Encounter (Signed)
Copied from [place] [id number]. Topic: Quick Communication - See Telephone Encounter >> Jun 21, 2017  8:47 AM Ether Griffins B wrote: CRM for notification. See Telephone encounter for: 06/21/17.  Pt would like to have lab work done since she has been taken off her cholesterol medication to check her levels and make sure they are where they need to be.

## 2017-06-22 NOTE — Telephone Encounter (Signed)
Left message for patient to give clinic a call back to schedule a follow-up per Dr. Martinique.

## 2017-06-25 NOTE — Telephone Encounter (Signed)
Spoke with patient, follow-up scheduled for 06/28/17.

## 2017-06-27 NOTE — Progress Notes (Signed)
HPI:   Nichole Walters is a 67 y.o. female, who is here today for 4-5 months follow up.   She was last seen in 01/2017.  Hyperlipidemia:  Currently on Pravastatin 20 mg daily. Following a low fat diet: Not consistently.  She has not noted side effects with medication.  Lab Results  Component Value Date   CHOL 194 07/16/2016   HDL 63 07/16/2016   LDLCALC 110 07/16/2016   TRIG 106 07/16/2016    History of elevated transaminases, she started hepatitis B and hepatitis A vaccination. Negative for abdominal pain, nausea, vomiting, color changes in urine or stool.  Lab Results  Component Value Date   ALT 38 (H) 02/22/2017   AST 33 02/22/2017   ALKPHOS 58 02/22/2017   BILITOT 0.7 02/22/2017     Hypertension:  Currently on Avalide 150-12.5 mg daily and Amlodipine 5 mg daily.  Home BP's: 110s-120s/70s. Last eye exam 2 months ago.  She is taking medications as instructed, no side effects reported.  She has not noted unusual headache, visual changes, exertional chest pain, dyspnea,  focal weakness, or edema.   Lab Results  Component Value Date   CREATININE 0.64 02/22/2017   BUN 14 02/22/2017   NA 138 02/22/2017   K 3.5 02/22/2017   CL 102 02/22/2017   CO2 28 02/22/2017    2 weeks of right shoulder pain, intermittently, exacerbated by certain activities that require overhead repetitive movements.  Alleviated by rest.  She does not recall any injury but she has been lifting wt:10 pounds weights. No neck pain, numbness, tingling, erythema, edema, or skin rash. No history of alcohol abuse.  She has not taken OTC medications. Throbbing/sharp pain. Pain was initially 8/10 and now it is 4/10.   Review of Systems  Constitutional: Negative for activity change, appetite change, fatigue and fever.  HENT: Negative for mouth sores, nosebleeds and trouble swallowing.   Eyes: Negative for redness and visual disturbance.  Respiratory: Negative for cough,  shortness of breath and wheezing.   Cardiovascular: Negative for chest pain, palpitations and leg swelling.  Gastrointestinal: Negative for abdominal pain, nausea and vomiting.       Negative for changes in bowel habits.  Genitourinary: Negative for decreased urine volume and hematuria.  Musculoskeletal: Positive for arthralgias. Negative for gait problem.  Neurological: Negative for syncope, weakness and headaches.    Current Outpatient Medications on File Prior to Visit  Medication Sig Dispense Refill  . amLODipine (NORVASC) 5 MG tablet Take 1 tablet (5 mg total) by mouth daily. PLEASE CONTACT OFFICE FOR ADDITIONAL REFILLS 15 tablet 0  . calcium carbonate (CALTRATE 600) 1500 (600 Ca) MG TABS tablet Take 600 mg of elemental calcium by mouth 2 (two) times daily with a meal.    . cholecalciferol (VITAMIN D) 1000 units tablet Take 1,000 Units by mouth daily.    . irbesartan-hydrochlorothiazide (AVALIDE) 150-12.5 MG tablet TAKE 1 TABLET BY MOUTH DAILY. PLEASE CONTACT OFFICE FOR ADDITIONAL REFILLS 15 tablet 0  . latanoprost (XALATAN) 0.005 % ophthalmic solution Place 1 drop into both eyes at bedtime.    Marland Kitchen OVER THE COUNTER MEDICATION Take 2 capsules by mouth 3 (three) times daily. Ligaplex II 5300 - herbal supplement     No current facility-administered medications on file prior to visit.      Past Medical History:  Diagnosis Date  . Anxiety    no meds  . Dyslipidemia 05/08/2013  . Essential hypertension 05/08/2013  . Palpitations 05/08/2013  Hx   . SVD (spontaneous vaginal delivery)    x 1   Allergies  Allergen Reactions  . Celexa [Citalopram] Swelling    Mouth swells    Social History   Socioeconomic History  . Marital status: Single    Spouse name: Not on file  . Number of children: Not on file  . Years of education: Not on file  . Highest education level: Not on file  Occupational History  . Not on file  Social Needs  . Financial resource strain: Not on file  . Food  insecurity:    Worry: Not on file    Inability: Not on file  . Transportation needs:    Medical: Not on file    Non-medical: Not on file  Tobacco Use  . Smoking status: Never Smoker  . Smokeless tobacco: Never Used  Substance and Sexual Activity  . Alcohol use: No  . Drug use: No  . Sexual activity: Yes    Birth control/protection: Post-menopausal  Lifestyle  . Physical activity:    Days per week: Not on file    Minutes per session: Not on file  . Stress: Not on file  Relationships  . Social connections:    Talks on phone: Not on file    Gets together: Not on file    Attends religious service: Not on file    Active member of club or organization: Not on file    Attends meetings of clubs or organizations: Not on file    Relationship status: Not on file  Other Topics Concern  . Not on file  Social History Narrative   Single mother of one, grandmother 74, great-grandmother 2.   Does not smoke, does not drink.   Exercises usually on treadmill mostly 3 days a week.    Vitals:   06/28/17 1215  BP: 124/72  Pulse: 65  Resp: 12  Temp: 98.5 F (36.9 C)  SpO2: 98%   Body mass index is 25.63 kg/m.   Physical Exam  Nursing note and vitals reviewed. Constitutional: She is oriented to person, place, and time. She appears well-developed and well-nourished. No distress.  HENT:  Head: Normocephalic and atraumatic.  Mouth/Throat: Oropharynx is clear and moist and mucous membranes are normal.  Eyes: Pupils are equal, round, and reactive to light. Conjunctivae are normal. No scleral icterus.  Cardiovascular: Normal rate and regular rhythm.  No murmur heard. Pulses:      Dorsalis pedis pulses are 2+ on the right side, and 2+ on the left side.  Respiratory: Effort normal and breath sounds normal. No respiratory distress.  GI: Soft. She exhibits no mass. There is no hepatomegaly. There is no tenderness.  Musculoskeletal: She exhibits no edema or tenderness.       Right shoulder:  She exhibits no bony tenderness.  Right shoulder: Pain upon palpation of lateral and anterior aspect. Luan Pulling' test pos, drop arm rotator cuff test neg, empty can supraspinatus test  neg, cross body adduction test  neg, lift-Off Subscapularis test pos. ROM otherwise normal.  Lymphadenopathy:    She has no cervical adenopathy.  Neurological: She is alert and oriented to person, place, and time. She has normal strength. Gait normal.  Skin: Skin is warm. No erythema.  Psychiatric: She has a normal mood and affect.  Well groomed, good eye contact.       ASSESSMENT AND PLAN:   Nichole Walters was seen today for 4-5 months follow-up.  Orders Placed This Encounter  Procedures  . Comprehensive metabolic panel  . Lipid panel    Essential hypertension Adequately controlled. No changes in current management. DASH-Low salt diet to continue. Eye exam is current. F/U in 6 months, before if needed.   Dyslipidemia No changes in current management, will follow labs done today and will give further recommendations accordingly. Follow-up in 6 to 12 months.  Elevated transaminase measurement Mild. Further recommendations will be given according to lab results.  Acute pain of right shoulder Possible etiology discussed: Rotator cuff tendinitis, OA. She prefers not to take medication. Recommend avoiding repetitive overhead activities and to be cautious with weight lifting.  Since there is no history of trauma, I do not think imaging is needed at this time. Follow-up as needed.      Wylodean Shimmel G. Martinique, MD  [facility]. [place] office.

## 2017-06-28 ENCOUNTER — Encounter: Payer: Self-pay | Admitting: Family Medicine

## 2017-06-28 ENCOUNTER — Ambulatory Visit: Payer: BLUE CROSS/BLUE SHIELD | Admitting: Family Medicine

## 2017-06-28 VITALS — BP 124/72 | HR 65 | Temp 98.5°F | Resp 12 | Ht 60.0 in | Wt 131.2 lb

## 2017-06-28 DIAGNOSIS — E785 Hyperlipidemia, unspecified: Secondary | ICD-10-CM

## 2017-06-28 DIAGNOSIS — I10 Essential (primary) hypertension: Secondary | ICD-10-CM

## 2017-06-28 DIAGNOSIS — M25511 Pain in right shoulder: Secondary | ICD-10-CM

## 2017-06-28 DIAGNOSIS — R7401 Elevation of levels of liver transaminase levels: Secondary | ICD-10-CM

## 2017-06-28 DIAGNOSIS — R740 Nonspecific elevation of levels of transaminase and lactic acid dehydrogenase [LDH]: Secondary | ICD-10-CM | POA: Diagnosis not present

## 2017-06-28 LAB — COMPREHENSIVE METABOLIC PANEL
ALBUMIN: 4.1 g/dL (ref 3.5–5.2)
ALT: 39 U/L — AB (ref 0–35)
AST: 37 U/L (ref 0–37)
Alkaline Phosphatase: 58 U/L (ref 39–117)
BILIRUBIN TOTAL: 0.8 mg/dL (ref 0.2–1.2)
BUN: 9 mg/dL (ref 6–23)
CALCIUM: 9.9 mg/dL (ref 8.4–10.5)
CHLORIDE: 103 meq/L (ref 96–112)
CO2: 31 mEq/L (ref 19–32)
Creatinine, Ser: 0.67 mg/dL (ref 0.40–1.20)
GFR: 113.67 mL/min (ref >60.00)
Glucose, Bld: 76 mg/dL (ref 70–99)
Potassium: 4.1 mEq/L (ref 3.5–5.1)
Sodium: 140 mEq/L (ref 135–145)
Total Protein: 7.8 g/dL (ref 6.0–8.3)

## 2017-06-28 LAB — LIPID PANEL
CHOL/HDL RATIO: 4
CHOLESTEROL: 194 mg/dL (ref 0–200)
HDL: 50 mg/dL (ref >39.00)
LDL Cholesterol: 127 mg/dL — ABNORMAL HIGH (ref 0–99)
NonHDL: 143.92
TRIGLYCERIDES: 85 mg/dL (ref 0.0–149.0)
VLDL: 17 mg/dL (ref 0.0–40.0)

## 2017-06-28 NOTE — Assessment & Plan Note (Signed)
No changes in current management, will follow labs done today and will give further recommendations accordingly. Follow-up in 6 to 12 months. 

## 2017-06-28 NOTE — Assessment & Plan Note (Signed)
Adequately controlled. No changes in current management. DASH-Low salt diet to continue. Eye exam is current. F/U in 6 months, before if needed.

## 2017-06-28 NOTE — Patient Instructions (Signed)
A few things to remember from today's visit:   Essential hypertension - Plan: Comprehensive metabolic panel  Dyslipidemia - Plan: Comprehensive metabolic panel, Lipid panel  Acute pain of right shoulder  Topical icy hot or Aspercreme my also help with shoulder pain.  For now no changes in rest of medications.  Please be sure medication list is accurate. If a new problem present, please set up appointment sooner than planned today.

## 2017-06-28 NOTE — Assessment & Plan Note (Signed)
Mild. Further recommendations will be given according to lab results. 

## 2017-07-07 ENCOUNTER — Encounter: Payer: Self-pay | Admitting: *Deleted

## 2017-07-07 ENCOUNTER — Other Ambulatory Visit: Payer: Self-pay | Admitting: *Deleted

## 2017-07-07 ENCOUNTER — Telehealth: Payer: Self-pay | Admitting: Family Medicine

## 2017-07-07 MED ORDER — PRAVASTATIN SODIUM 20 MG PO TABS: 20.0000 mg | ORAL_TABLET | Freq: Every day | ORAL | 3 refills | Status: DC

## 2017-07-07 NOTE — Telephone Encounter (Signed)
Copied from [place] [phone]. Topic: Quick Communication - Lab Results >> Jul 05, 2017 10:50 AM Zacarias Pontes, CMA wrote: Called patient to inform them of their lab results. When patient returns call, triage nurse may disclose results.

## 2017-07-07 NOTE — Telephone Encounter (Signed)
Spoke with patient, gave instructions per Dr. Martinique. Rx sent to pharmacy.

## 2017-07-07 NOTE — Telephone Encounter (Signed)
See result note on 06/28/17. Pt was given lab results on 5/7 by [facility] triage RN. Pt returning call to  Zacarias Pontes, CMA from today regarding medication.

## 2017-07-08 ENCOUNTER — Telehealth: Payer: Self-pay | Admitting: Cardiology

## 2017-07-08 MED ORDER — IRBESARTAN-HYDROCHLOROTHIAZIDE 150-12.5 MG PO TABS: 1.0000 | ORAL_TABLET | Freq: Every day | ORAL | 0 refills | Status: DC

## 2017-07-08 MED ORDER — AMLODIPINE BESYLATE 5 MG PO TABS: 5.0000 mg | ORAL_TABLET | Freq: Every day | ORAL | 0 refills | Status: DC

## 2017-07-08 NOTE — Telephone Encounter (Signed)
Rx(s) sent to pharmacy electronically. OV September 10, 2017 with MD

## 2017-07-08 NOTE — Telephone Encounter (Signed)
New Message    *STAT* If patient is at the pharmacy, call can be transferred to refill team.   1. Which medications need to be refilled? (please list name of each medication and dose if known) amLODipine (NORVASC) 5 MG tablet and irbesartan-hydrochlorothiazide (AVALIDE) 150-12.5 MG  tablet  2. Which pharmacy/location (including street and city if local pharmacy) is medication to be sent to? [id number] - [place], [place] - [address] AT [address] FROM [address]  3. Do they need a 30 day or 90 day supply? [place]

## 2017-08-17 ENCOUNTER — Other Ambulatory Visit: Payer: Self-pay | Admitting: Cardiology

## 2017-08-17 NOTE — Telephone Encounter (Signed)
Disp Refills Start End   irbesartan-hydrochlorothiazide (AVALIDE) 150-12.5 MG tablet 90 tablet 0 07/08/2017    Sig - Route: Take 1 tablet by mouth daily. - Oral   Sent to pharmacy as: irbesartan-hydrochlorothiazide (AVALIDE) 150-12.5 MG tablet   E-Prescribing Status: Receipt confirmed by pharmacy (07/08/2017 3:38 PM EDT)   Pharmacy   [id number] - [place], [place] - [address] AT [address]

## 2017-09-06 ENCOUNTER — Encounter: Payer: Self-pay | Admitting: Family Medicine

## 2017-09-06 ENCOUNTER — Ambulatory Visit (INDEPENDENT_AMBULATORY_CARE_PROVIDER_SITE_OTHER): Payer: Medicare HMO | Admitting: Family Medicine

## 2017-09-06 VITALS — BP 124/70 | HR 68 | Temp 98.6°F | Ht 60.0 in | Wt 134.5 lb

## 2017-09-06 DIAGNOSIS — Z780 Asymptomatic menopausal state: Secondary | ICD-10-CM | POA: Diagnosis not present

## 2017-09-06 DIAGNOSIS — R7401 Elevation of levels of liver transaminase levels: Secondary | ICD-10-CM

## 2017-09-06 DIAGNOSIS — Z0000 Encounter for general adult medical examination without abnormal findings: Secondary | ICD-10-CM | POA: Diagnosis not present

## 2017-09-06 DIAGNOSIS — G4700 Insomnia, unspecified: Secondary | ICD-10-CM | POA: Diagnosis not present

## 2017-09-06 DIAGNOSIS — R740 Nonspecific elevation of levels of transaminase and lactic acid dehydrogenase [LDH]: Secondary | ICD-10-CM

## 2017-09-06 DIAGNOSIS — Z23 Encounter for immunization: Secondary | ICD-10-CM | POA: Diagnosis not present

## 2017-09-06 DIAGNOSIS — M25511 Pain in right shoulder: Secondary | ICD-10-CM | POA: Diagnosis not present

## 2017-09-06 MED ORDER — DOXEPIN HCL 10 MG/ML PO CONC: 3.0000 mg | Freq: Every day | ORAL | 1 refills | Status: DC

## 2017-09-06 MED ORDER — ZOSTER VAC RECOMB ADJUVANTED 50 MCG/0.5ML IM SUSR: INTRAMUSCULAR | 1 refills | Status: DC

## 2017-09-06 NOTE — Progress Notes (Signed)
HPI:   Nichole Walters is a 67 y.o. female, who is here today for her welcome to Medicare visit.  Last CPE: 03/2016.  Regular exercise 3 or more time per week: 2 times per week she does the bike and treadmill. Following a healthy diet: Not consistently. She lives alone but sometimes her son stays with her.  Chronic medical problems: Hypertension, hyperlipidemia, vitamin D deficiency, and elevated transaminases, and some.  Pap smear 04/2017. She sees gyn annually.   Immunization History  Administered Date(s) Administered  . Hep A / Hep B 03/09/2017, 04/12/2017  . Tdap 07/17/2016    Mammogram: 04/2017 at her gyn's office. Colonoscopy: 08/01/15 DEXA: 2017  Hep C screening: 01/2017 NR  Independent ADL's and IADL's. No falls in the past year and denies depression symptoms.  Functional Status Survey: Is the patient deaf or have difficulty hearing?: No Does the patient have difficulty seeing, even when wearing glasses/contacts?: No(left eye glaucoma with decreased vision) Does the patient have difficulty concentrating, remembering, or making decisions?: No Does the patient have difficulty walking or climbing stairs?: No Does the patient have difficulty dressing or bathing?: No Does the patient have difficulty doing errands alone such as visiting a doctor's office or shopping?: No  Fall Risk  09/06/2017  Falls in the past year? 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 She has been taking care of her brother,who has Alzheimer's dementia. 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  Providers she sees regularly:  Eye care provider: Dr Albina Billet visit 04/2016. Gyn,Dr Deatra Ina, 04/2017 Dr Hardin,cardiologist.   Depression screen [facility] 2/9 09/06/2017  Decreased Interest 0  Down, Depressed, Hopeless 0  PHQ - 2 Score 0    Mini-Cog - 09/06/17 1202    Normal clock drawing test?  yes    How  many words correct?  3         Hearing Screening   125Hz  250Hz  500Hz  1000Hz  2000Hz  3000Hz  4000Hz  6000Hz  8000Hz   Right ear:   Pass  Pass  Pass    Left ear:   pass  Pass  Pass      Visual Acuity Screening   Right eye Left eye Both eyes  Without correction: 20/20  20/20  With correction:     Comments: Patient legally blind in left eye, could not see anything on the board.  She has a few concerns today.  She wonders if Ligaplex is causing elevation of transaminases. She has Hx of lower back pain with radiation, which has greatly improved and no longer radiated to LE's since she started supplement.  Denies abdominal pain, nausea, vomiting, changes in bowel habits,or melena.   Insomnia: She has had problem for many years. She just sleeps about 3-4 hours. She has tried Tylenol Pm, 1/3 tab because higher dose causes drowsiness next day.  Melatonin did not help.   Back pain,last time she followed with ortho was last year. It was radiated to LLE until she started taking OTC supplement.  Lower back pain is mild. No saddle anesthesia or urine/bowel incontinence.   Right shoulder pain that has been going on for a couple months and not getting any better. No history of trauma. Pain is exacerbated by movement and by laying on right side in bed. Alleviated by rest. Pain is "not bad".   She has been lifting 7 pounds wts, wonders if this is the  caused the pain. No edema or erythema. No limitation of ROM She is not taking OTC treatment.  Negative for numbness, tingling, or weakness.   Review of Systems  Constitutional: Negative for activity change, appetite change, fatigue and fever.  HENT: Negative for hearing loss, mouth sores, nosebleeds and trouble swallowing.   Eyes: Negative for pain and redness.  Respiratory: Negative for cough, shortness of breath and wheezing.   Cardiovascular: Negative for chest pain, palpitations and leg swelling.  Gastrointestinal: Negative for  abdominal pain, nausea and vomiting.       Negative for changes in bowel habits.  Endocrine: Negative for polydipsia, polyphagia and polyuria.  Genitourinary: Negative for decreased urine volume, difficulty urinating, dysuria and hematuria.  Musculoskeletal: Positive for arthralgias. Negative for gait problem.  Skin: Negative for rash and wound.  Neurological: Negative for syncope, weakness, numbness and headaches.  Psychiatric/Behavioral: Positive for sleep disturbance. Negative for confusion. The patient is nervous/anxious.       Current Outpatient Medications on File Prior to Visit  Medication Sig Dispense Refill  . amLODipine (NORVASC) 5 MG tablet Take 1 tablet (5 mg total) by mouth daily. 90 tablet 0  . calcium carbonate (CALTRATE 600) 1500 (600 Ca) MG TABS tablet Take 600 mg of elemental calcium by mouth 2 (two) times daily with a meal.    . cholecalciferol (VITAMIN D) 1000 units tablet Take 1,000 Units by mouth daily.    . irbesartan-hydrochlorothiazide (AVALIDE) 150-12.5 MG tablet Take 1 tablet by mouth daily. 90 tablet 0  . latanoprost (XALATAN) 0.005 % ophthalmic solution Place 1 drop into both eyes at bedtime.    Marland Kitchen OVER THE COUNTER MEDICATION Take 2 capsules by mouth 3 (three) times daily. Ligaplex II 5300 - herbal supplement    . pravastatin (PRAVACHOL) 20 MG tablet Take 1 tablet (20 mg total) by mouth daily. 90 tablet 3   No current facility-administered medications on file prior to visit.      Past Medical History:  Diagnosis Date  . Anxiety    no meds  . Dyslipidemia 05/08/2013  . Essential hypertension 05/08/2013  . Palpitations 05/08/2013   Hx   . SVD (spontaneous vaginal delivery)    x 1    Past Surgical History:  Procedure Laterality Date  . CARDIAC CATHETERIZATION  04/25/2001   EF>60%  . COLONOSCOPY    . DOPPLER ECHOCARDIOGRAPHY  02/13/2009   EF>55%  . HYSTEROSCOPY N/A 07/22/2015   Procedure: HYSTEROSCOPY;  Surgeon: Alden Hipp, MD;  Location: [facility];   Service: Gynecology;  Laterality: N/A;  . NM MYOVIEW LTD  02/13/2009   EF 70%  Low risk scan  . NM PERSANTINE MYOVIEW LTD  December 2010   EF> 70%. No ischemia or infarction  . TRANSTHORACIC ECHOCARDIOGRAM  December 2010   Normal LV size and function. Mild MR and TR. Otherwise normal. Normal diastolic function.  . WISDOM TOOTH EXTRACTION      Allergies  Allergen Reactions  . Celexa [Citalopram] Swelling    Mouth swells    History reviewed. No pertinent family history.  Social History   Socioeconomic History  . Marital status: Single    Spouse name: Not on file  . Number of children: Not on file  . Years of education: Not on file  . Highest education level: Not on file  Occupational History  . Not on file  Social Needs  . Financial resource strain: Not on file  . Food insecurity:    Worry: Not on file  Inability: Not on file  . Transportation needs:    Medical: Not on file    Non-medical: Not on file  Tobacco Use  . Smoking status: Never Smoker  . Smokeless tobacco: Never Used  Substance and Sexual Activity  . Alcohol use: No  . Drug use: No  . Sexual activity: Yes    Birth control/protection: Post-menopausal  Lifestyle  . Physical activity:    Days per week: Not on file    Minutes per session: Not on file  . Stress: Not on file  Relationships  . Social connections:    Talks on phone: Not on file    Gets together: Not on file    Attends religious service: Not on file    Active member of club or organization: Not on file    Attends meetings of clubs or organizations: Not on file    Relationship status: Not on file  Other Topics Concern  . Not on file  Social History Narrative   Single mother of one, grandmother 64, great-grandmother 2.   Does not smoke, does not drink.   Exercises usually on treadmill mostly 3 days a week.     Vitals:   09/06/17 1132  BP: 124/70  Pulse: 68  Temp: 98.6 F (37 C)  SpO2: 98%   Body mass index is 26.27  kg/m.   Wt Readings from Last 3 Encounters:  09/06/17 134 lb 8 oz (61 kg)  06/28/17 131 lb 4 oz (59.5 kg)  02/22/17 138 lb 4 oz (62.7 kg)     Physical Exam  Nursing note and vitals reviewed. Constitutional: She is oriented to person, place, and time. She appears well-developed. No distress.  HENT:  Head: Normocephalic and atraumatic.  Right Ear: Hearing, tympanic membrane, external ear and ear canal normal.  Left Ear: Hearing, tympanic membrane, external ear and ear canal normal.  Mouth/Throat: Uvula is midline, oropharynx is clear and moist and mucous membranes are normal.  Eyes: Pupils are equal, round, and reactive to light. Conjunctivae and EOM are normal.  Cardiovascular: Normal rate and regular rhythm.  No murmur heard. Pulses:      Dorsalis pedis pulses are 2+ on the right side, and 2+ on the left side.  Respiratory: Effort normal and breath sounds normal. No respiratory distress.  GI: Soft. She exhibits no mass. There is no hepatomegaly. There is no tenderness.  Genitourinary:  Genitourinary Comments: Deferred to gynecologist.  Musculoskeletal: She exhibits no edema.  No major deformity or signs of synovitis appreciated. Right shoulder: No deformity, edema, or erythema appreciated.No muscle atrophy. Luan Pulling' test positive, drop arm rotator cuff test neg, empty can supraspinatus test pos, cross body adduction test neg, lift-Off Subscapularis test pos. ROM not limited, it elicits pain.  Lymphadenopathy:    She has no cervical adenopathy.       Right: No supraclavicular adenopathy present.       Left: No supraclavicular adenopathy present.  Neurological: She is alert and oriented to person, place, and time. She has normal strength. No cranial nerve deficit. Gait normal.  Reflex Scores:      Bicep reflexes are 2+ on the right side and 2+ on the left side.      Patellar reflexes are 2+ on the right side and 2+ on the left side. Skin: Skin is warm. No rash noted. No  erythema.  Psychiatric: She has a normal mood and affect. Her speech is normal.  Well groomed, good eye contact.  ASSESSMENT AND PLAN:  Ms. Retal Tonkinson was here today annual physical examination.     Orders Placed This Encounter  Procedures  . DG Bone Density  . Pneumococcal conjugate vaccine 13-valent IM  . Hepatitis A hepatitis B combined vaccine IM  . Ambulatory referral to Orthopedic Surgery    Welcome to Medicare preventive visit  We discussed the importance of staying active, physically and mentally, as well as the benefits of a healthy/balance diet. Low impact exercise that involve stretching and strengthing are ideal. Vaccines updated. We discussed preventive screening for the next 5-10 years, summery of recommendations given in AVS:  Colon cancer screening due in 07/2020. Continue following with ophthalmologist for glaucoma treatment. Diabetes screening q 1-3 years. DEXA ordered. Mammogram q 1-2 years,ordered by her gyn.. Fall prevention.  Advance directives and end of life discussed, she has a will but no POA or living will. Form for advance medical directive given and information in AVS given.    Right shoulder pain, unspecified chronicity  Possible causes discussed.? Rotator cuff tendinitis. She is concerned about shoulder pain, she doe snot want PT but rather ortho referral.  -     Ambulatory referral to Orthopedic Surgery  Asymptomatic postmenopausal estrogen deficiency -     DG Bone Density; Future  Need for hepatitis A and B vaccination -     Hepatitis A hepatitis B combined vaccine IM  Need for vaccination against Streptococcus pneumoniae using pneumococcal conjugate vaccine 13 -     Pneumococcal conjugate vaccine 13-valent IM  Insomnia, unspecified type  Good sleep hygiene. Pharmacologic options discussed, recommend Doxepin 3-6 mg at bedtime as needed. F/U in 5 months.  -     doxepin (SINEQUAN) 10 MG/ML solution; Take 0.3-0.6  mLs (3-6 mg total) by mouth at bedtime.  Other orders -     Zoster Vaccine Adjuvanted [facility]) injection; 0.5 ml in muscle and repeat in 8 weeks    Elevated transaminase measurement Review prior LFTs, ALT elevation is mild. For now I will recommend changes in medications. We will continue following. Today she is completing hepatitis vaccination.     Return in about 5 months (around 02/06/2018) for HLD,HTN.     Ledon Weihe G. Martinique, MD  [facility]. [place] office.

## 2017-09-06 NOTE — Patient Instructions (Addendum)
A few things to remember from today's visit:   Welcome to Medicare preventive visit  Right shoulder pain, unspecified chronicity - Plan: Ambulatory referral to Orthopedic Surgery  Elevated transaminase measurement  Asymptomatic postmenopausal estrogen deficiency - Plan: DG Bone Density   A few tips:  -As we age balance is not as good as it was, so there is a higher risks for falls. Please remove small rugs and furniture that is "in your way" and could increase the risk of falls. Stretching exercises may help with fall prevention: Yoga and Tai Chi are some examples. Low impact exercise is better, so you are not very achy the next day.  -Sun screen and avoidance of direct sun light recommended. Caution with dehydration, if working outdoors be sure to drink enough fluids.  - Some medications are not safe as we age, increases the risk of side effects and can potentially interact with other medication you are also taken;  including some of over the counter medications. Be sure to let me know when you start a new medication even if it is a dietary/vitamin supplement.   -Healthy diet low in red meet/animal fat and sugar + regular physical activity is recommended.       Screening schedule for the next 5-10 years:  Colonoscopy in 2022  Glaucoma screening/eye exam every 1-2 years.  Mammogram for breast cancer screening annually.  Flu vaccine annually.  Diabetes screening   Fall prevention   Advance directives:  Please see a lawyer and/or go to this website to help you with advanced directives and designating a health care power of attorney so that your wishes will be followed should you become too ill to make your own medical decisions.  http://www.ncdhhs.gov/aging/direct.htm       Please be sure medication list is accurate. If a new problem present, please set up appointment sooner than planned today.        

## 2017-09-06 NOTE — Assessment & Plan Note (Signed)
Review prior LFTs, ALT elevation is mild. For now I will recommend changes in medications. We will continue following. Today she is completing hepatitis vaccination.

## 2017-09-10 ENCOUNTER — Ambulatory Visit (INDEPENDENT_AMBULATORY_CARE_PROVIDER_SITE_OTHER): Payer: Medicare HMO | Admitting: Cardiology

## 2017-09-10 ENCOUNTER — Encounter: Payer: Self-pay | Admitting: Cardiology

## 2017-09-10 VITALS — BP 122/74 | HR 78 | Ht 60.0 in | Wt 134.4 lb

## 2017-09-10 DIAGNOSIS — R002 Palpitations: Secondary | ICD-10-CM | POA: Diagnosis not present

## 2017-09-10 DIAGNOSIS — I10 Essential (primary) hypertension: Secondary | ICD-10-CM

## 2017-09-10 DIAGNOSIS — E785 Hyperlipidemia, unspecified: Secondary | ICD-10-CM

## 2017-09-10 NOTE — Progress Notes (Signed)
PCP: Martinique, Betty G, MD  Clinic Note: Chief Complaint  Patient presents with  . Follow-up    pt denied chest pain    HPI: Nichole Walters is a 67 y.o. female with a PMH below who presents today for 40month follow-up for hypertension hyperlipidemia and palpitations with a family history of CAD.Lorna Dibble Charlsie Merles was last seen on December 06, 2015 was doing well with no problems.  Recent Hospitalizations: none  Studies Personally Reviewed - (if available, images/films reviewed: From Epic Chart or Care Everywhere)  none  Interval History: Siniya returns today doing quite well overall cardiac standpoint.  She is now walking up to an hour a day about 3 or 4 days a week.  Is.  She is eagerly awaiting starting Silver sneakers now she is turned 36.  She is also hoping to do water aerobics to avoid hurting her knees.  She has been on do her routine exercise as of late because of her right shoulder rotator cuff issue, but is still walking without too much difficulty. She continues to be astigmatic from a cardiac standpoint.  No chest pain or shortness of breath with rest or exertion. No PND, orthopnea or edema. No palpitations, lightheadedness, dizziness, weakness or syncope/near syncope. No TIA/amaurosis fugax symptoms. No claudication.  ROS: A comprehensive was performed. Review of Systems  Constitutional: Negative for chills, fever and malaise/fatigue.  HENT: Negative for nosebleeds.   Respiratory: Negative for cough and shortness of breath.   Gastrointestinal: Negative for blood in stool and melena.  Genitourinary: Negative for hematuria.  Musculoskeletal: Positive for joint pain (R shoulder Rotator Cuff pain).  Neurological: Negative for dizziness, loss of consciousness and weakness.  Psychiatric/Behavioral: Negative.   All other systems reviewed and are negative.  I have reviewed and (if needed) personally updated the patient's problem list, medications, allergies, past  medical and surgical history, social and family history.   Past Medical History:  Diagnosis Date  . Anxiety    no meds  . Dyslipidemia 05/08/2013  . Essential hypertension 05/08/2013  . Palpitations 05/08/2013   Hx   . SVD (spontaneous vaginal delivery)    x 1    Past Surgical History:  Procedure Laterality Date  . CARDIAC CATHETERIZATION  04/25/2001   EF>60%  . COLONOSCOPY    . DOPPLER ECHOCARDIOGRAPHY  02/13/2009   EF>55%  . HYSTEROSCOPY N/A 07/22/2015   Procedure: HYSTEROSCOPY;  Surgeon: Alden Hipp, MD;  Location: [facility];  Service: Gynecology;  Laterality: N/A;  . NM MYOVIEW LTD  02/13/2009   EF 70%  Low risk scan  . NM PERSANTINE MYOVIEW LTD  December 2010   EF> 70%. No ischemia or infarction  . TRANSTHORACIC ECHOCARDIOGRAM  December 2010   Normal LV size and function. Mild MR and TR. Otherwise normal. Normal diastolic function.  . WISDOM TOOTH EXTRACTION      Current Meds  Medication Sig  . amLODipine (NORVASC) 5 MG tablet Take 1 tablet (5 mg total) by mouth daily.  . calcium carbonate (CALTRATE 600) 1500 (600 Ca) MG TABS tablet Take 600 mg of elemental calcium by mouth 2 (two) times daily with a meal.  . cholecalciferol (VITAMIN D) 1000 units tablet Take 1,000 Units by mouth daily.  Marland Kitchen doxepin (SINEQUAN) 10 MG/ML solution Take 0.3-0.6 mLs (3-6 mg total) by mouth at bedtime.  . irbesartan-hydrochlorothiazide (AVALIDE) 150-12.5 MG tablet Take 1 tablet by mouth daily.  Marland Kitchen latanoprost (XALATAN) 0.005 % ophthalmic solution Place 1 drop into both eyes  at bedtime.  Marland Kitchen OVER THE COUNTER MEDICATION Take 2 capsules by mouth 3 (three) times daily. Ligaplex II 5300 - herbal supplement  . pravastatin (PRAVACHOL) 20 MG tablet Take 1 tablet (20 mg total) by mouth daily.  Marland Kitchen Zoster Vaccine Adjuvanted [facility]) injection 0.5 ml in muscle and repeat in 8 weeks    Allergies  Allergen Reactions  . Celexa [Citalopram] Swelling    Mouth swells    Social History   Tobacco Use  . Smoking  status: Never Smoker  . Smokeless tobacco: Never Used  Substance Use Topics  . Alcohol use: No  . Drug use: No   Social History   Social History Narrative   Single mother of one, grandmother 2, great-grandmother 2.   Does not smoke, does not drink.   Exercises usually on treadmill mostly 3 days a week.  -- She and her husband have been involved in an Alzheimer's study at [facility] --she is the "healthy" subject.  family history is not on file.  Wt Readings from Last 3 Encounters:  09/10/17 134 lb 6.4 oz (61 kg)  09/06/17 134 lb 8 oz (61 kg)  06/28/17 131 lb 4 oz (59.5 kg)    PHYSICAL EXAM BP 122/74   Pulse 78   Ht 5' (1.524 m)   Wt 134 lb 6.4 oz (61 kg)   BMI 26.25 kg/m  Physical Exam  Constitutional: She is oriented to person, place, and time. She appears well-developed. No distress.  Well-nourished.  Well-groomed.  HENT:  Head: Normocephalic and atraumatic.  Neck: Normal range of motion. Neck supple. No JVD present.  Cardiovascular: Normal rate, regular rhythm, normal heart sounds and intact distal pulses.  No extrasystoles are present. PMI is not displaced. Exam reveals no gallop and no friction rub.  No murmur (1/6 systolic murmur heard at the apex) heard. Pulmonary/Chest: Effort normal and breath sounds normal. No respiratory distress. She has no wheezes. She has no rales.  Abdominal: Soft. Bowel sounds are normal. She exhibits no distension. There is no tenderness. There is no rebound.  No HSM  Neurological: She is alert and oriented to person, place, and time.  Psychiatric: She has a normal mood and affect. Her behavior is normal. Judgment and thought content normal.  Vitals reviewed.    Adult ECG Report  Rate: 78 ;  Rhythm: normal sinus rhythm, premature ventricular contractions (PVC) and Cannot exclude anterior MI can be determined.  Otherwise normal axis, intervals and durations.;   Narrative Interpretation: Stable EKG with PVCs being new.   Other studies  Reviewed: Additional studies/ records that were reviewed today include:  Recent Labs: June 28, 2017  TC 194, TG 85, LDL 127, HDL 50.  A1c 5.4.  Hemoglobin 12.3.  Creatinine 0.67.  Potassium 4.1.  TSH 2.21.   ASSESSMENT / PLAN: Problem List Items Addressed This Visit    Palpitations - Primary (Chronic)    Stable.  Well controlled.  Currently not on beta-blocker.  When her stress has been relieved, she is not having the palpitations.  We will hold off on treating.      Relevant Orders   EKG 12-Lead   Essential hypertension (Chronic)    Well-controlled.  Continue with healthy diet and exercise. On amlodipine and ARB-HCTZ.      Dyslipidemia (Chronic)      I spent a total of 20 minutes with the patient and chart review. >  50% of the time was spent in direct patient consultation.   Current medicines  are reviewed at length with the patient today.  (+/- concerns) none The following changes have been made:  none  Patient Instructions  NO CHANGES  MEDICATIONS    Your physician wants you to follow-up in [place]. You will receive a reminder letter in the mail two months in advance. If you don't receive a letter, please call our office to schedule the follow-up appointment.    If you need a refill on your cardiac medications before your next appointment, please call your pharmacy.     Studies Ordered:   Orders Placed This Encounter  Procedures  . EKG 12-Lead      Glenetta Hew, M.D., M.S. Interventional Cardiologist   Pager # [phone] Phone # [phone] [address] [place], [place] [address]   Thank you for choosing [facility] at [facility]!!

## 2017-09-10 NOTE — Patient Instructions (Signed)
NO CHANGES  MEDICATIONS    Your physician wants you to follow-up in [place]. You will receive a reminder letter in the mail two months in advance. If you don't receive a letter, please call our office to schedule the follow-up appointment.    If you need a refill on your cardiac medications before your next appointment, please call your pharmacy.

## 2017-09-13 ENCOUNTER — Encounter: Payer: Self-pay | Admitting: Cardiology

## 2017-09-13 DIAGNOSIS — H401132 Primary open-angle glaucoma, bilateral, moderate stage: Secondary | ICD-10-CM | POA: Diagnosis not present

## 2017-09-13 NOTE — Assessment & Plan Note (Signed)
Stable.  Well controlled.  Currently not on beta-blocker.  When her stress has been relieved, she is not having the palpitations.  We will hold off on treating.

## 2017-09-13 NOTE — Assessment & Plan Note (Signed)
Well-controlled.  Continue with healthy diet and exercise. On amlodipine and ARB-HCTZ.

## 2017-09-29 ENCOUNTER — Ambulatory Visit (INDEPENDENT_AMBULATORY_CARE_PROVIDER_SITE_OTHER): Payer: BLUE CROSS/BLUE SHIELD | Admitting: Orthopedic Surgery

## 2017-10-05 ENCOUNTER — Other Ambulatory Visit: Payer: Self-pay | Admitting: Cardiology

## 2017-10-06 ENCOUNTER — Other Ambulatory Visit: Payer: Self-pay | Admitting: Cardiology

## 2017-10-06 NOTE — Telephone Encounter (Signed)
Rx sent to pharmacy   

## 2017-11-04 ENCOUNTER — Telehealth: Payer: Self-pay | Admitting: Family Medicine

## 2017-11-04 ENCOUNTER — Telehealth: Payer: Self-pay | Admitting: Cardiology

## 2017-11-04 ENCOUNTER — Ambulatory Visit: Payer: Self-pay | Admitting: Family Medicine

## 2017-11-04 MED ORDER — LOSARTAN POTASSIUM-HCTZ 50-12.5 MG PO TABS: 1.0000 | ORAL_TABLET | Freq: Every day | ORAL | 1 refills | Status: DC

## 2017-11-04 NOTE — Telephone Encounter (Signed)
Copied from [place]. Topic: Quick Communication - Rx Refill/Question >> Nov 04, 2017  3:15 PM Yvette Rack wrote: Medication: losartan-hydrochlorothiazide (HYZAAR) 50-12.5 MG tablet   pt been waiting for three weeks to fill this from her heart Doctor  Has the patient contacted their pharmacy? No. (Agent: If no, request that the patient contact the pharmacy for the refill.) (Agent: If yes, when and what did the pharmacy advise?)  Preferred Pharmacy (with phone number or street name): [id number] [place], [address] [phone] (Phone) [phone] (Fax)      CANCELL THIS REQUEST PT NEVER CALLED TO SEE IF IT WAS AT PHARMACY  Agent: Please be advised that RX refills may take up to 3 business days. We ask that you follow-up with your pharmacy.

## 2017-11-04 NOTE — Telephone Encounter (Signed)
Void this request  Pt never called pharmacy to see if RX is there

## 2017-11-04 NOTE — Telephone Encounter (Signed)
  Reason for Disposition . Caller has medication question only, adult not sick, and triager answers question  Protocols used: MEDICATION QUESTION CALL-A-AH  

## 2017-11-04 NOTE — Telephone Encounter (Signed)
Pt has question about taking the losartan-hydrochlorothiazide (HYZAAR) 50-12.5 MG tablet Call pt at [phone]  Patient just wanted to make sure that this was the medication that Dr Martinique had substituted for her Avalide and that she was to continue the Amlodipine. Confirmed yes for both questions per chart note for patient. Patient also wanted to know if liver damage was a risk with the new medication- per [facility]- it is not listed as a major risk factor.

## 2017-11-04 NOTE — Telephone Encounter (Signed)
New Message:      *STAT* If patient is at the pharmacy, call can be transferred to refill team.   1. Which medications need to be refilled? (please list name of each medication and dose if known)  losartan-hydrochlorothiazide (HYZAAR) 50-12.5 MG tablet  2. Which pharmacy/location (including street and city if local pharmacy) is medication to be sent to? [id number] [place], [place] [address] AT [address]  3. Do they need a 30 day or 90 day supply? 30 Days

## 2017-11-11 DIAGNOSIS — H409 Unspecified glaucoma: Secondary | ICD-10-CM | POA: Diagnosis not present

## 2017-11-11 DIAGNOSIS — E785 Hyperlipidemia, unspecified: Secondary | ICD-10-CM | POA: Diagnosis not present

## 2017-11-11 DIAGNOSIS — Z888 Allergy status to other drugs, medicaments and biological substances status: Secondary | ICD-10-CM | POA: Diagnosis not present

## 2017-11-11 DIAGNOSIS — M8580 Other specified disorders of bone density and structure, unspecified site: Secondary | ICD-10-CM | POA: Diagnosis not present

## 2017-11-11 DIAGNOSIS — Z8249 Family history of ischemic heart disease and other diseases of the circulatory system: Secondary | ICD-10-CM | POA: Diagnosis not present

## 2017-11-11 DIAGNOSIS — I10 Essential (primary) hypertension: Secondary | ICD-10-CM | POA: Diagnosis not present

## 2017-11-11 DIAGNOSIS — Z809 Family history of malignant neoplasm, unspecified: Secondary | ICD-10-CM | POA: Diagnosis not present

## 2017-11-11 DIAGNOSIS — H547 Unspecified visual loss: Secondary | ICD-10-CM | POA: Diagnosis not present

## 2017-11-26 ENCOUNTER — Other Ambulatory Visit: Payer: Self-pay | Admitting: Cardiology

## 2017-11-26 NOTE — Telephone Encounter (Signed)
Rx request sent to pharmacy.  

## 2017-12-25 ENCOUNTER — Other Ambulatory Visit: Payer: Self-pay | Admitting: Cardiology

## 2017-12-27 NOTE — Telephone Encounter (Signed)
Rx request sent to pharmacy.  

## 2018-01-14 ENCOUNTER — Other Ambulatory Visit: Payer: Self-pay | Admitting: Family Medicine

## 2018-01-14 ENCOUNTER — Telehealth: Payer: Self-pay | Admitting: *Deleted

## 2018-01-14 DIAGNOSIS — M549 Dorsalgia, unspecified: Secondary | ICD-10-CM | POA: Insufficient documentation

## 2018-01-14 DIAGNOSIS — M545 Low back pain, unspecified: Secondary | ICD-10-CM

## 2018-01-14 DIAGNOSIS — G8929 Other chronic pain: Secondary | ICD-10-CM

## 2018-01-14 NOTE — Telephone Encounter (Signed)
Message sent to Dr. Martinique for review and approval.  Copied from [place] [phone]. Topic: Referral - Request for Referral >> Jan 13, 2018 12:07 PM Scherrie Gerlach wrote: Has patient seen PCP for this complaint? no Pt states she was dx with a herniated disk 10 yrs ago and was supposed to have surgery, but she did not have the surgery. Pt states her back has started to hurt again, and she wants to know who Dr Martinique would refer her to for this issue.

## 2018-01-14 NOTE — Telephone Encounter (Signed)
Ortho referral placed. Thanks, BJ

## 2018-01-18 ENCOUNTER — Other Ambulatory Visit: Payer: Self-pay | Admitting: Cardiology

## 2018-01-25 ENCOUNTER — Encounter: Payer: Self-pay | Admitting: Family Medicine

## 2018-01-25 ENCOUNTER — Ambulatory Visit (INDEPENDENT_AMBULATORY_CARE_PROVIDER_SITE_OTHER): Payer: Medicare HMO | Admitting: Family Medicine

## 2018-01-25 VITALS — BP 124/82 | HR 95 | Temp 98.6°F | Resp 12 | Ht 60.0 in | Wt 141.0 lb

## 2018-01-25 DIAGNOSIS — I10 Essential (primary) hypertension: Secondary | ICD-10-CM

## 2018-01-25 DIAGNOSIS — M549 Dorsalgia, unspecified: Secondary | ICD-10-CM | POA: Diagnosis not present

## 2018-01-25 MED ORDER — TIZANIDINE HCL 4 MG PO TABS: 2.0000 mg | ORAL_TABLET | Freq: Every evening | ORAL | 0 refills | Status: DC | PRN

## 2018-01-25 MED ORDER — NAPROXEN 500 MG PO TABS: 500.0000 mg | ORAL_TABLET | Freq: Two times a day (BID) | ORAL | 0 refills | Status: AC

## 2018-01-25 NOTE — Patient Instructions (Addendum)
A few things to remember from today's visit:   Upper back pain on right side - Plan: naproxen (NAPROSYN) 500 MG tablet, tiZANidine (ZANAFLEX) 4 MG tablet  Essential hypertension Take naproxen with breakfast and dinner for 5 to 7 days. Zanaflex can give you drowsiness, so recommend taking it at bedtime. Local heat or topical icy hot. Range of motion exercises.   Please be sure medication list is accurate. If a new problem present, please set up appointment sooner than planned today.

## 2018-01-25 NOTE — Assessment & Plan Note (Signed)
Adequately controlled. No changes in current management. Continue low-salt diet. Eye exam recommended annually. F/U in 4-5 months, before if needed.

## 2018-01-25 NOTE — Progress Notes (Signed)
ACUTE VISIT   HPI:  Chief Complaint  Patient presents with  . Spasms    lower back, right-sided x 4 days    Nichole Walters is a 67 y.o. female, who is here today complaining of 4 days of right-sided upper back achy pain. She is reporting symptoms as a new problem.   She denies injuries but states that during the past few weeks she did a lot of pulling and lifting. She was taking care of her brother in childhood, who has history of dementia.  Pain seems to be better today.  She denies fever, chills, tingling, numbness, dyspnea, wheezing, rash, or local edema. Occasionally she has nonproductive cough, which aggravates pain.  Pain is exacerbated by certain movements and alleviated by rest and local heat. Until yesterday pain was intermittent.  This morning pain was 10/10 and now it is "not really bad."  Yesterday she took Tylenol 500 mg x 2.  She does have history of lower back pain, she has an appointment with Dr. Lorin Mercy on 02/11/2017.  HTN: Last follow-up on 06/28/2017. Currently she is on losartan-HCTZ 50-12.5 mg daily and amlodipine 5 mg daily. She is not checking BP at home. Denies severe/frequent headache, visual changes, chest pain, dyspnea, palpitation, focal weakness, or edema.   Lab Results  Component Value Date   CREATININE 0.67 06/28/2017   BUN 9 06/28/2017   NA 140 06/28/2017   K 4.1 06/28/2017   CL 103 06/28/2017   CO2 31 06/28/2017     Review of Systems  Constitutional: Positive for activity change. Negative for appetite change, chills, fatigue and fever.  HENT: Negative for mouth sores and nosebleeds.   Eyes: Negative for redness and visual disturbance.  Respiratory: Negative for shortness of breath and wheezing.   Cardiovascular: Negative for chest pain, palpitations and leg swelling.  Gastrointestinal: Negative for abdominal pain, nausea and vomiting.       Negative for changes in bowel habits.  Genitourinary: Negative for  decreased urine volume and hematuria.  Musculoskeletal: Positive for back pain and myalgias. Negative for joint swelling.  Skin: Negative for color change and rash.  Neurological: Negative for weakness, numbness and headaches.      Current Outpatient Medications on File Prior to Visit  Medication Sig Dispense Refill  . amLODipine (NORVASC) 5 MG tablet TAKE 1 TABLET BY MOUTH EVERY DAY 90 tablet 0  . calcium carbonate (CALTRATE 600) 1500 (600 Ca) MG TABS tablet Take 600 mg of elemental calcium by mouth 2 (two) times daily with a meal.    . cholecalciferol (VITAMIN D) 1000 units tablet Take 1,000 Units by mouth daily.    Marland Kitchen doxepin (SINEQUAN) 10 MG/ML solution Take 0.3-0.6 mLs (3-6 mg total) by mouth at bedtime. 30 mL 1  . latanoprost (XALATAN) 0.005 % ophthalmic solution Place 1 drop into both eyes at bedtime.    Marland Kitchen losartan-hydrochlorothiazide (HYZAAR) 50-12.5 MG tablet TAKE 1 TABLET BY MOUTH EVERY DAY 90 tablet 3  . OVER THE COUNTER MEDICATION Take 2 capsules by mouth 3 (three) times daily. Ligaplex II 5300 - herbal supplement    . pravastatin (PRAVACHOL) 20 MG tablet Take 1 tablet (20 mg total) by mouth daily. 90 tablet 3  . Zoster Vaccine Adjuvanted [facility]) injection 0.5 ml in muscle and repeat in 8 weeks 0.5 mL 1   No current facility-administered medications on file prior to visit.      Past Medical History:  Diagnosis Date  . Anxiety  no meds  . Dyslipidemia 05/08/2013  . Essential hypertension 05/08/2013  . Palpitations 05/08/2013   Hx   . SVD (spontaneous vaginal delivery)    x 1   Allergies  Allergen Reactions  . Celexa [Citalopram] Swelling    Mouth swells    Social History   Socioeconomic History  . Marital status: Single    Spouse name: Not on file  . Number of children: Not on file  . Years of education: Not on file  . Highest education level: Not on file  Occupational History  . Not on file  Social Needs  . Financial resource strain: Not on file  . Food  insecurity:    Worry: Not on file    Inability: Not on file  . Transportation needs:    Medical: Not on file    Non-medical: Not on file  Tobacco Use  . Smoking status: Never Smoker  . Smokeless tobacco: Never Used  Substance and Sexual Activity  . Alcohol use: No  . Drug use: No  . Sexual activity: Yes    Birth control/protection: Post-menopausal  Lifestyle  . Physical activity:    Days per week: Not on file    Minutes per session: Not on file  . Stress: Not on file  Relationships  . Social connections:    Talks on phone: Not on file    Gets together: Not on file    Attends religious service: Not on file    Active member of club or organization: Not on file    Attends meetings of clubs or organizations: Not on file    Relationship status: Not on file  Other Topics Concern  . Not on file  Social History Narrative   Single mother of one, grandmother 6, great-grandmother 2.   Does not smoke, does not drink.   Exercises usually on treadmill mostly 3 days a week.    Vitals:   01/25/18 1605  BP: 124/82  Pulse: 95  Resp: 12  Temp: 98.6 F (37 C)  SpO2: 97%   Body mass index is 27.54 kg/m.   Physical Exam  Nursing note and vitals reviewed. Constitutional: She is oriented to person, place, and time. She appears well-developed. She does not appear ill. No distress.  HENT:  Head: Normocephalic and atraumatic.  Eyes: Conjunctivae are normal.  Cardiovascular: Normal rate and regular rhythm.  No murmur heard. Respiratory: Effort normal and breath sounds normal. No respiratory distress.  GI: Soft. She exhibits no mass. There is no hepatomegaly. There is no tenderness.  Musculoskeletal: She exhibits no edema.       Back:  Mild scoliosis. No tenderness upon palpation of cervical or throacic paraspinal muscles. Mild pain elicited on affected area upon left-sided rotation.  No local edema or erythema appreciated, no suspicious lesions.  Lymphadenopathy:    She has no  cervical adenopathy.  Neurological: She is alert and oriented to person, place, and time. She has normal strength. Gait normal.  Skin: Skin is warm. No rash noted. No erythema.  Psychiatric: She has a normal mood and affect.  Well groomed, good eye contact.     ASSESSMENT AND PLAN:  Nichole Walters was seen today for spasms.  Diagnoses and all orders for this visit:  Upper back pain on right side I do not think imaging is needed today given the fact that there is no history of blunt trauma. We discussed some side effects of NSAIDs, recommend monitoring BP daily while she is taking naproxen.  Naproxen 500 mg twice daily for 5 to 7 days, instructed not to start medication if pain resolved. We discussed side effects of muscle relaxants. Local heat or IcyHot/Aspercreme on affected area. PT exercises that may help with pain taught today.  -     naproxen (NAPROSYN) 500 MG tablet; Take 1 tablet (500 mg total) by mouth 2 (two) times daily with a meal for 7 days. -     tiZANidine (ZANAFLEX) 4 MG tablet; Take 0.5-1 tablets (2-4 mg total) by mouth at bedtime as needed for up to 20 days for muscle spasms.   Essential hypertension Adequately controlled. No changes in current management. Continue low-salt diet. Eye exam recommended annually. F/U in 4-5 months, before if needed.      Return in about 4 months (around 05/26/2018) for HTN.      Willmar Stockinger G. Martinique, MD  [facility]. [place] office.

## 2018-01-29 ENCOUNTER — Other Ambulatory Visit: Payer: Self-pay | Admitting: Cardiology

## 2018-01-31 NOTE — Telephone Encounter (Signed)
Rx(s) sent to pharmacy electronically.  

## 2018-02-01 DIAGNOSIS — H401132 Primary open-angle glaucoma, bilateral, moderate stage: Secondary | ICD-10-CM | POA: Diagnosis not present

## 2018-02-10 ENCOUNTER — Other Ambulatory Visit: Payer: Self-pay | Admitting: Family Medicine

## 2018-02-10 DIAGNOSIS — M549 Dorsalgia, unspecified: Secondary | ICD-10-CM

## 2018-02-11 ENCOUNTER — Ambulatory Visit (INDEPENDENT_AMBULATORY_CARE_PROVIDER_SITE_OTHER): Payer: Self-pay | Admitting: Orthopaedic Surgery

## 2018-02-17 DIAGNOSIS — M545 Low back pain: Secondary | ICD-10-CM | POA: Diagnosis not present

## 2018-02-17 DIAGNOSIS — Z79899 Other long term (current) drug therapy: Secondary | ICD-10-CM | POA: Diagnosis not present

## 2018-03-01 ENCOUNTER — Ambulatory Visit (INDEPENDENT_AMBULATORY_CARE_PROVIDER_SITE_OTHER): Payer: Medicare HMO

## 2018-03-01 ENCOUNTER — Ambulatory Visit (INDEPENDENT_AMBULATORY_CARE_PROVIDER_SITE_OTHER): Payer: Medicare HMO | Admitting: Orthopaedic Surgery

## 2018-03-01 ENCOUNTER — Encounter (INDEPENDENT_AMBULATORY_CARE_PROVIDER_SITE_OTHER): Payer: Self-pay | Admitting: Orthopaedic Surgery

## 2018-03-01 VITALS — BP 118/72 | HR 74 | Ht 64.0 in | Wt 140.0 lb

## 2018-03-01 DIAGNOSIS — M545 Low back pain, unspecified: Secondary | ICD-10-CM

## 2018-03-01 MED ORDER — PREDNISONE 5 MG PO TABS: ORAL_TABLET | ORAL | 0 refills | Status: DC

## 2018-03-01 NOTE — Progress Notes (Signed)
Office Visit Note   Patient: Nichole Walters           Date of Birth: 02-27-1951           MRN: [id number] Visit Date: 03/01/2018              Requested by: Martinique, Betty G, MD [address] [place], [place] [address] PCP: Martinique, Betty G, MD   Assessment & Plan: Visit Diagnoses:  1. Acute bilateral low back pain, unspecified whether sciatica present     Plan: Flareup of disc degeneration likely L4-5 L5-S1 based on previous scan.  We will place her on a prednisone 5 mg reduced dose, Dosepak.  Physical therapy.  Recheck 5 weeks.  If she develops weakness she will call.  Previous MRI as well as plain radiographs were reviewed pathophysiology discussed.  Follow-Up Instructions: Return in about 4 weeks (around 03/29/2018).   Orders:  Orders Placed This Encounter  Procedures  . XR Lumbar Spine 2-3 Views  . Ambulatory referral to Physical Therapy   Meds ordered this encounter  Medications  . predniSONE (DELTASONE) 5 MG tablet    Sig: Take 6 then 5 then 4,3,2,1 tablets daily then stop.  Take with food each day take one less tablet    Dispense:  21 tablet    Refill:  0      Procedures: No procedures performed   Clinical Data: No additional findings.   Subjective: Chief Complaint  Patient presents with  . Lower Back - Pain    HPI 67 year old female with intermittent back symptoms for several years with previous MRI showing some disc protrusion on the left at L5 1 and on the right at L4-5 and 2009.  She had been doing well with occasional mild symptoms until 2 months ago where she started having increased back pain leg pain problems getting upright and even started having pain when she is in a sitting position which previously gave her relief.  She denies associated bowel or bladder symptoms of fever or chills.  Negative for falls or acute trauma.  Review of Systems Positive for lumbar disc degeneration.  Vitamin D deficiency, hypertension hyperlipidemia, insomnia.   Negative cardiovascular respiratory GU.  Objective: Vital Signs: BP 118/72   Pulse 74   Ht 5\' 4"  (1.626 m)   Wt 140 lb (63.5 kg)   BMI 24.03 kg/m   Physical Exam Constitutional:      Appearance: She is well-developed.  HENT:     Head: Normocephalic.     Right Ear: External ear normal.     Left Ear: External ear normal.  Eyes:     Pupils: Pupils are equal, round, and reactive to light.  Neck:     Thyroid: No thyromegaly.     Trachea: No tracheal deviation.  Cardiovascular:     Rate and Rhythm: Normal rate.  Pulmonary:     Effort: Pulmonary effort is normal.  Abdominal:     Palpations: Abdomen is soft.  Skin:    General: Skin is warm and dry.  Neurological:     Mental Status: She is alert and oriented to person, place, and time.  Psychiatric:        Behavior: Behavior normal.     Ortho Exam knee and ankle jerk 3+ negative logroll to the hips quads are strong no knee effusion.  Mild sciatic notch tenderness minimal trochanteric bursal tenderness forward flexion fingertips to mid tibial level with back pain.  Mild discomfort with extension no  lumbar midline defects.  Specialty Comments:  No specialty comments available.  Imaging: Xr Lumbar Spine 2-3 Views  Result Date: 03/01/2018 AP lateral lumbar spine x-rays are obtained and reviewed.  This shows some narrowing at L4-5 L5-S1 without listhesis. Impression: Mild disc space narrowing L4-5 L5-S1 negative for acute changes.    PMFS History: Patient Active Problem List   Diagnosis Date Noted  . Back pain 01/14/2018  . 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 Elevated transaminase measurement 06/28/2017  . Vitamin D deficiency, unspecified 02/22/2017  . Essential hypertension 05/08/2013  . Dyslipidemia 05/08/2013  . Palpitations 05/08/2013   Past Medical History:  Diagnosis Date  . Anxiety    no meds  . Dyslipidemia 05/08/2013  . Essential hypertension 05/08/2013  . Palpitations 05/08/2013   Hx   . SVD (spontaneous  vaginal delivery)    x 1    No family history on file.  Past Surgical History:  Procedure Laterality Date  . CARDIAC CATHETERIZATION  04/25/2001   EF>60%  . COLONOSCOPY    . DOPPLER ECHOCARDIOGRAPHY  02/13/2009   EF>55%  . HYSTEROSCOPY N/A 07/22/2015   Procedure: HYSTEROSCOPY;  Surgeon: Alden Hipp, MD;  Location: [facility];  Service: Gynecology;  Laterality: N/A;  . NM MYOVIEW LTD  02/13/2009   EF 70%  Low risk scan  . NM PERSANTINE MYOVIEW LTD  December 2010   EF> 70%. No ischemia or infarction  . TRANSTHORACIC ECHOCARDIOGRAM  December 2010   Normal LV size and function. Mild MR and TR. Otherwise normal. Normal diastolic function.  . WISDOM TOOTH EXTRACTION     Social History   Occupational History  . Not on file  Tobacco Use  . Smoking status: Never Smoker  . Smokeless tobacco: Never Used  Substance and Sexual Activity  . Alcohol use: No  . Drug use: No  . Sexual activity: Yes    Birth control/protection: Post-menopausal

## 2018-03-09 ENCOUNTER — Other Ambulatory Visit: Payer: Self-pay

## 2018-03-09 ENCOUNTER — Encounter: Payer: Self-pay | Admitting: Physical Therapy

## 2018-03-09 ENCOUNTER — Ambulatory Visit: Payer: Medicare Other | Attending: Orthopaedic Surgery | Admitting: Physical Therapy

## 2018-03-09 DIAGNOSIS — M6281 Muscle weakness (generalized): Secondary | ICD-10-CM | POA: Diagnosis not present

## 2018-03-09 DIAGNOSIS — G8929 Other chronic pain: Secondary | ICD-10-CM | POA: Diagnosis not present

## 2018-03-09 DIAGNOSIS — M5442 Lumbago with sciatica, left side: Secondary | ICD-10-CM | POA: Insufficient documentation

## 2018-03-09 DIAGNOSIS — R29898 Other symptoms and signs involving the musculoskeletal system: Secondary | ICD-10-CM | POA: Diagnosis not present

## 2018-03-09 NOTE — Therapy (Signed)
[facility] [address]  [place], [place], [address] Phone: [phone]   Fax:  [phone]  Physical Therapy Evaluation  Patient Details  Name: Nichole Walters MRN: [id number] Date of Birth: [birth year] Referring Provider (PT): Rodell Perna, MD   Encounter Date: 03/09/2018  PT End of Session - 03/09/18 1457    Visit Number  1    Number of Visits  13    Date for PT Re-Evaluation  04/20/18    Authorization Type  UHC Medicare    PT Start Time  9357    PT Stop Time  1449    PT Time Calculation (min)  47 min    Activity Tolerance  Patient tolerated treatment well    Behavior During Therapy  [facility] for tasks assessed/performed       Past Medical History:  Diagnosis Date  . Anxiety    no meds  . Dyslipidemia 05/08/2013  . Essential hypertension 05/08/2013  . Palpitations 05/08/2013   Hx   . SVD (spontaneous vaginal delivery)    x 1    Past Surgical History:  Procedure Laterality Date  . CARDIAC CATHETERIZATION  04/25/2001   EF>60%  . COLONOSCOPY    . DOPPLER ECHOCARDIOGRAPHY  02/13/2009   EF>55%  . HYSTEROSCOPY N/A 07/22/2015   Procedure: HYSTEROSCOPY;  Surgeon: Alden Hipp, MD;  Location: [facility];  Service: Gynecology;  Laterality: N/A;  . NM MYOVIEW LTD  02/13/2009   EF 70%  Low risk scan  . NM PERSANTINE MYOVIEW LTD  December 2010   EF> 70%. No ischemia or infarction  . TRANSTHORACIC ECHOCARDIOGRAM  December 2010   Normal LV size and function. Mild MR and TR. Otherwise normal. Normal diastolic function.  . WISDOM TOOTH EXTRACTION      There were no vitals filed for this visit.   Subjective Assessment - 03/09/18 1407    Subjective  Patient reports LBP since Oct 2019. Has a brother that has Alzheimer's and has to lift him every once in a while. Also had an incident in Nov 2019 when she was lifting a case of water and reports that pain hasn't left since then. Pain started on L LB but has since spread to R  side. Intermittent L lateral LE pain radiating down to toes with N/T, as well as L LE weakness. Denies falls d/t this. Worse with bending forward, prolonged sitting- especially when sitting on a soft surface. Has had cortisone injections and been treated by a chiropractor with not much benefit. Better with Ligaplex, heat, and standing.     Pertinent History  palpitations, HTN, HLD, cardiac cath 2003    Limitations  Sitting;House hold activities;Lifting    How long can you sit comfortably?  2 hours    How long can you stand comfortably?  unlimited    How long can you walk comfortably?  unlimited    Diagnostic tests  03/01/18 lumbar xray: Mild disc space narrowing L4-5 L5-S1 negative for acute changes. Per MD- previous MRI showing some disc protrusion on the left at L5 1 and on the right at L4-5    Patient Stated Goals  "no pain and not being stiff whenever I get up"    Currently in Pain?  Yes    Pain Score  2     Pain Location  Back    Pain Orientation  Left;Right;Lower    Pain Descriptors / Indicators  Aching    Pain Type  Chronic  pain    Pain Radiating Towards  Herby Abraham         [facility] PT Assessment - 03/09/18 1420      Assessment   Medical Diagnosis  Acute bilateral LBP     Referring Provider (PT)  Rodell Perna, MD    Onset Date/Surgical Date  --   Oct 2019   Next MD Visit  --   not scheduled   Prior Therapy  Yes- for finger      Precautions   Precautions  None      Restrictions   Weight Bearing Restrictions  No      Balance Screen   Has the patient fallen in the past 6 months  No    Has the patient had a decrease in activity level because of a fear of falling?   No    Is the patient reluctant to leave their home because of a fear of falling?   No      Home Environment   Living Environment  Private residence    Living Arrangements  Children    Available Help at Discharge  Family    Type of [place] Access  Level entry    [place]  One level      Prior Function    Level of [place]  Retired    Leisure  volunteering in nursery- lifting babies; volunteering at [employer]   Overall Cognitive Status  Within Functional Limits for tasks assessed      Observation/Other Assessments   Focus on Therapeutic Outcomes (FOTO)   Lumbar: 47 (53% limited, 38% predicted)      Sensation   Light Touch  Appears Intact      Coordination   Gross Motor Movements are Fluid and Coordinated  Yes      Posture/Postural Control   Posture/Postural Control  Postural limitations    Postural Limitations  Rounded Shoulders      ROM / Strength   AROM / PROM / Strength  AROM;Strength      AROM   AROM Assessment Site  Lumbar    Lumbar Flexion  toes   heavy reliance on hip flexion   Lumbar Extension  WFL    Lumbar - Right Side Bend  jt line   tighness   Lumbar - Left Side Bend  jt line   tightness   Lumbar - Right Rotation  mildly limited    Lumbar - Left Rotation  mildly limited      Strength   Strength Assessment Site  Hip;Knee;Ankle    Right/Left Hip  Left;Right    Right Hip Flexion  5/5    Right Hip ABduction  4+/5    Right Hip ADduction  4+/5    Left Hip Flexion  4+/5    Left Hip ABduction  4+/5    Left Hip ADduction  4+/5    Right/Left Knee  Right;Left    Right Knee Flexion  4/5    Right Knee Extension  4/5    Left Knee Flexion  4/5    Left Knee Extension  4/5    Right/Left Ankle  Right;Left    Right Ankle Dorsiflexion  4+/5    Right Ankle Plantar Flexion  4+/5    Left Ankle Dorsiflexion  4+/5    Left Ankle Plantar Flexion  4+/5      Flexibility  Soft Tissue Assessment /Muscle Length  yes    Hamstrings  excellent flexibility in B LEs    Piriformis  B moderate tightness      Palpation   Palpation comment  TTP in L lower paraspinals, superior and lateral glute      Ambulation/Gait   Assistive device  None    Gait Pattern  Step-through pattern;Decreased weight shift to right;Decreased  weight shift to left;Lateral trunk lean to right;Lateral trunk lean to left   very mild lateral trunk lean   Gait velocity  WFL                Objective measurements completed on examination: See above findings.              PT Education - 03/09/18 1456    Education Details  prognosis, POC, HEP    Person(s) Educated  Patient    Methods  Explanation;Demonstration;Tactile cues;Verbal cues;Handout    Comprehension  Verbalized understanding;Returned demonstration       PT Short Term Goals - 03/09/18 1505      PT SHORT TERM GOAL #1   Title  Patient to be independent with initial HEP.    Time  3    Period  Weeks    Status  New    Target Date  03/30/18        PT Long Term Goals - 03/09/18 1506      PT LONG TERM GOAL #1   Title  Patient to be independent with advanced HEP.    Time  6    Period  Weeks    Status  New    Target Date  04/20/18      PT LONG TERM GOAL #2   Title  Patient to demonstrate B LE strength >=4+/5.    Time  6    Period  Weeks    Status  New    Target Date  04/20/18      PT LONG TERM GOAL #3   Title  Patient to demonstrate lumbar AROM without pain limiting and WFL.    Time  6    Period  Weeks    Status  New    Target Date  04/20/18      PT LONG TERM GOAL #4   Title  Patient to demonstrate proper body mechanics with lifting 10lb box and without pain limiting.     Time  6    Period  Weeks    Status  New    Target Date  04/20/18      PT LONG TERM GOAL #5   Title  Patient to report return to volunteering activities without pain limiting.     Time  6    Period  Weeks    Status  New    Target Date  04/20/18             Plan - 03/09/18 1458    Clinical Impression Statement  Patient is a 67y/o F presenting to OPPT with c/o chronic B LBP with intermittent pain, N/T, and weakness in L LE. Recent flare has been present for the past 3 months- notes she has been lifting disabled brother and had an episode when she was lifting  a case of water that caused onset of pain. Reports trouble with bending forward, prolonged sitting- especially when sitting on a soft surface. Patient today with limited lumbar AROM- most hesitant with flexion with heavy reliance of hip flexion and HS flexibility, decreased  LE strength, TTP in L lower paraspinals, superior and lateral glute, decreased piriformis flexibility. Educated on gentle strengthening and stretching HEP and given handout. Patient reported understanding. Would benefit from skilled PT services 2x/week for 6 weeks to address aforementioned impairments.     Clinical Presentation  Stable    Clinical Decision Making  Low    Rehab Potential  Good    Clinical Impairments Affecting Rehab Potential  palpitations, HTN, HLD, cardiac cath 2003    PT Frequency  2x / week    PT Duration  6 weeks    PT Treatment/Interventions  ADLs/Self Care Home Management;Cryotherapy;Electrical Stimulation;Functional mobility training;Stair training;Gait training;Ultrasound;Traction;Moist Heat;Therapeutic activities;Therapeutic exercise;Balance training;Neuromuscular re-education;Patient/family education;Passive range of motion;Manual techniques;Dry needling;Energy conservation;Splinting;Taping    PT Next Visit Plan  reassess HEP    Consulted and Agree with Plan of Care  Patient       Patient will benefit from skilled therapeutic intervention in order to improve the following deficits and impairments:  Decreased range of motion, Difficulty walking, Increased muscle spasms, Decreased activity tolerance, Pain, Impaired flexibility, Improper body mechanics, Postural dysfunction, Decreased strength  Visit Diagnosis: Chronic bilateral low back pain with left-sided sciatica  Other symptoms and signs involving the musculoskeletal system  Muscle weakness (generalized)     Problem List Patient Active Problem List   Diagnosis Date Noted  . Back pain 01/14/2018  . 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 Elevated  transaminase measurement 06/28/2017  . Vitamin D deficiency, unspecified 02/22/2017  . Essential hypertension 05/08/2013  . Dyslipidemia 05/08/2013  . Palpitations 05/08/2013    Janene Harvey, PT, DPT 03/09/18 3:09 PM   [facility] [address]  [place], [place], [address] Phone: [phone]   Fax:  [phone]  Name: Nichole Walters MRN: [id number] Date of Birth: 1950/12/03

## 2018-03-14 ENCOUNTER — Ambulatory Visit: Payer: Medicare Other | Admitting: Physical Therapy

## 2018-03-17 ENCOUNTER — Encounter: Payer: Medicare Other | Admitting: Physical Therapy

## 2018-03-22 ENCOUNTER — Encounter: Payer: Self-pay | Admitting: Physical Therapy

## 2018-03-22 ENCOUNTER — Ambulatory Visit: Payer: Medicare Other | Admitting: Physical Therapy

## 2018-03-22 DIAGNOSIS — M5442 Lumbago with sciatica, left side: Principal | ICD-10-CM

## 2018-03-22 DIAGNOSIS — M6281 Muscle weakness (generalized): Secondary | ICD-10-CM

## 2018-03-22 DIAGNOSIS — G8929 Other chronic pain: Secondary | ICD-10-CM

## 2018-03-22 DIAGNOSIS — R29898 Other symptoms and signs involving the musculoskeletal system: Secondary | ICD-10-CM | POA: Diagnosis not present

## 2018-03-22 NOTE — Therapy (Signed)
[facility] [address]  [place], [place], [address] Phone: [phone]   Fax:  [phone]  Physical Therapy Treatment  Patient Details  Name: Nichole Walters MRN: [id number] Date of Birth: 08-23-1950 Referring Provider (PT): Rodell Perna, MD   Encounter Date: 03/22/2018  PT End of Session - 03/22/18 1402    Visit Number  2    Number of Visits  13    Date for PT Re-Evaluation  04/20/18    Authorization Type  UHC Medicare    PT Start Time  1315    PT Stop Time  1359    PT Time Calculation (min)  44 min    Activity Tolerance  Patient tolerated treatment well    Behavior During Therapy  [facility] for tasks assessed/performed       Past Medical History:  Diagnosis Date  . Anxiety    no meds  . Dyslipidemia 05/08/2013  . Essential hypertension 05/08/2013  . Palpitations 05/08/2013   Hx   . SVD (spontaneous vaginal delivery)    x 1    Past Surgical History:  Procedure Laterality Date  . CARDIAC CATHETERIZATION  04/25/2001   EF>60%  . COLONOSCOPY    . DOPPLER ECHOCARDIOGRAPHY  02/13/2009   EF>55%  . HYSTEROSCOPY N/A 07/22/2015   Procedure: HYSTEROSCOPY;  Surgeon: Alden Hipp, MD;  Location: [facility];  Service: Gynecology;  Laterality: N/A;  . NM MYOVIEW LTD  02/13/2009   EF 70%  Low risk scan  . NM PERSANTINE MYOVIEW LTD  December 2010   EF> 70%. No ischemia or infarction  . TRANSTHORACIC ECHOCARDIOGRAM  December 2010   Normal LV size and function. Mild MR and TR. Otherwise normal. Normal diastolic function.  . WISDOM TOOTH EXTRACTION      There were no vitals filed for this visit.  Subjective Assessment - 03/22/18 1320    Subjective  Reports that she feels that the stretches are helping.     Pertinent History  palpitations, HTN, HLD, cardiac cath 2003    Diagnostic tests  03/01/18 lumbar xray: Mild disc space narrowing L4-5 L5-S1 negative for acute changes. Per MD- previous MRI showing some disc protrusion on  the left at L5 1 and on the right at L4-5    Patient Stated Goals  "no pain and not being stiff whenever I get up"    Currently in Pain?  No/denies                       [facility] Adult PT Treatment/Exercise - 03/22/18 0001      Self-Care   Self-Care  Other Self-Care Comments    Other Self-Care Comments   edu and practice of ball on wall to L piriformis and paraspinals      Exercises   Exercises  Knee/Hip;Lumbar      Lumbar Exercises: Stretches   Single Knee to Chest Stretch  Right;Left;1 rep;30 seconds    Single Knee to Chest Stretch Limitations  to tolerance    Lower Trunk Rotation Limitations  20x to both sides to tolerance    Piriformis Stretch  Right;Left;1 rep;30 seconds    Piriformis Stretch Limitations  KTOS    Figure 4 Stretch  1 rep;With overpressure    Figure 4 Stretch Limitations  each LE to tolerance; correction of LE positioning for deeper stretch    Other Lumbar Stretch Exercise  L QL stretch in door way 2x30"  Lumbar Exercises: Supine   Bridge  10 reps    Bridge Limitations  cues to perform reps rather than hold    Bridge with clamshell  10 reps    Bridge with [employer] Limitations  red TB around knees    Other Supine Lumbar Exercises  HS bridge x10      Lumbar Exercises: Prone   Other Prone Lumbar Exercises  prone on elbows 10x3"      Knee/Hip Exercises: Aerobic   Recumbent Bike  L2 x 6 min      Knee/Hip Exercises: Prone   Hip Extension  Strengthening;Right;Left;1 set;10 reps    Hip Extension Limitations  prone donkey kicks      Manual Therapy   Manual Therapy  Soft tissue mobilization;Myofascial release    Soft tissue mobilization  STM to B glutes, piriformis, lumbar paraspinals, QL- increased soft tissue restriction on L    Myofascial Release  manual TPR to L QL, superior glute, piriformis             PT Education - 03/22/18 1402    Education Details  update to HEP, edu on ball on wall massage, administered red TB     Person(s) Educated  Patient    Methods  Explanation;Demonstration;Tactile cues;Verbal cues;Handout    Comprehension  Verbalized understanding;Returned demonstration       PT Short Term Goals - 03/22/18 1406      PT SHORT TERM GOAL #1   Title  Patient to be independent with initial HEP.    Time  3    Period  Weeks    Status  On-going        PT Long Term Goals - 03/22/18 1406      PT LONG TERM GOAL #1   Title  Patient to be independent with advanced HEP.    Time  6    Period  Weeks    Status  On-going      PT LONG TERM GOAL #2   Title  Patient to demonstrate B LE strength >=4+/5.    Time  6    Period  Weeks    Status  On-going      PT LONG TERM GOAL #3   Title  Patient to demonstrate lumbar AROM without pain limiting and WFL.    Time  6    Period  Weeks    Status  On-going      PT LONG TERM GOAL #4   Title  Patient to demonstrate proper body mechanics with lifting 10lb box and without pain limiting.     Time  6    Period  Weeks    Status  On-going      PT LONG TERM GOAL #5   Title  Patient to report return to volunteering activities without pain limiting.     Time  6    Period  Weeks    Status  On-going            Plan - 03/22/18 1402    Clinical Impression Statement  Patient arrived to session with report of compliance with HEP and belief that the stretches have been helping her. Began session with STM and TPR to B glutes, piriformis, lumbar paraspinals, and QL, with patient demonstrating increased soft tissue restriction on L side. Educated patient on self-STM using ball on wall for pain relief in these areas. Reviewed HEP for improved understanding and carryover. Progressed bridge with red TB around knees as well as with  hamstring bias. Updated HEP with bridge with red TB and QL stretch as these were well tolerated. No complaints at end of session.     Clinical Impairments Affecting Rehab Potential  palpitations, HTN, HLD, cardiac cath 2003    PT  Treatment/Interventions  ADLs/Self Care Home Management;Cryotherapy;Electrical Stimulation;Functional mobility training;Stair training;Gait training;Ultrasound;Traction;Moist Heat;Therapeutic activities;Therapeutic exercise;Balance training;Neuromuscular re-education;Patient/family education;Passive range of motion;Manual techniques;Dry needling;Energy conservation;Splinting;Taping    PT Next Visit Plan  progress LE stretching and strengthening     Consulted and Agree with Plan of Care  Patient       Patient will benefit from skilled therapeutic intervention in order to improve the following deficits and impairments:  Decreased range of motion, Difficulty walking, Increased muscle spasms, Decreased activity tolerance, Pain, Impaired flexibility, Improper body mechanics, Postural dysfunction, Decreased strength  Visit Diagnosis: Chronic bilateral low back pain with left-sided sciatica  Other symptoms and signs involving the musculoskeletal system  Muscle weakness (generalized)     Problem List Patient Active Problem List   Diagnosis Date Noted  . Back pain 01/14/2018  . 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 Elevated transaminase measurement 06/28/2017  . Vitamin D deficiency, unspecified 02/22/2017  . Essential hypertension 05/08/2013  . Dyslipidemia 05/08/2013  . Palpitations 05/08/2013    Janene Harvey, PT, DPT 03/22/18 2:07 PM   [facility] [address]  [place], [place], [address] Phone: [phone]   Fax:  [phone]  Name: Nichole Walters MRN: [id number] Date of Birth: June 26, 1950

## 2018-03-23 ENCOUNTER — Telehealth: Payer: Self-pay | Admitting: Cardiology

## 2018-03-23 MED ORDER — VALSARTAN-HYDROCHLOROTHIAZIDE 160-12.5 MG PO TABS: 1.0000 | ORAL_TABLET | Freq: Every day | ORAL | 6 refills | Status: DC

## 2018-03-23 NOTE — Telephone Encounter (Signed)
Returned call to patient Nichole Walters's recommendation given.Prescription sent to pharmacy.Advised to monitor B/P for 2 to 3 weeks and call back if elevated.

## 2018-03-23 NOTE — Telephone Encounter (Signed)
New Message    Pt c/o medication issue:  1. Name of Medication: Losartan  2. How are you currently taking this medication (dosage and times per day)? 12.5mg  once a day  3. Are you having a reaction (difficulty breathing--STAT)? No   4. What is your medication issue? Patient states all the pharmacy's she went to are out of stock will need a alternative.

## 2018-03-23 NOTE — Telephone Encounter (Signed)
Was taking losartan hctz 50/12.5, suggest switching to valsartan hctz 160/12.5 and have patient monitor BP for 2-3 weeks

## 2018-03-23 NOTE — Telephone Encounter (Signed)
Message sent to pharmacy for advice. °

## 2018-03-24 ENCOUNTER — Telehealth: Payer: Self-pay | Admitting: Cardiology

## 2018-03-24 NOTE — Telephone Encounter (Signed)
Informed pt that Valsartan-HCTZ 160/12.5 is the equal strength of Losartan-HCTZ 50/12.5 mg. Pt verbalized understanding.

## 2018-03-24 NOTE — Telephone Encounter (Signed)
New Message .     Patient is calling today about her b/p meds strength. She normally takes 50-12.5 mg But what she picked up is 160-12.5 mg. Patient is calling to see if this is ok or not.

## 2018-03-29 ENCOUNTER — Ambulatory Visit (INDEPENDENT_AMBULATORY_CARE_PROVIDER_SITE_OTHER): Payer: Medicare HMO | Admitting: Orthopaedic Surgery

## 2018-03-29 ENCOUNTER — Encounter: Payer: Self-pay | Admitting: Physical Therapy

## 2018-03-29 ENCOUNTER — Ambulatory Visit: Payer: Medicare Other | Admitting: Physical Therapy

## 2018-03-29 DIAGNOSIS — M6281 Muscle weakness (generalized): Secondary | ICD-10-CM | POA: Diagnosis not present

## 2018-03-29 DIAGNOSIS — G8929 Other chronic pain: Secondary | ICD-10-CM | POA: Diagnosis not present

## 2018-03-29 DIAGNOSIS — M5442 Lumbago with sciatica, left side: Principal | ICD-10-CM

## 2018-03-29 DIAGNOSIS — R29898 Other symptoms and signs involving the musculoskeletal system: Secondary | ICD-10-CM

## 2018-03-29 NOTE — Therapy (Addendum)
[facility] [address]  [place], [place], [address] Phone: [phone]   Fax:  [phone]  Physical Therapy Treatment  Patient Details  Name: Nichole Walters MRN: [id number] Date of Birth: September 12, 1950 Referring Provider (PT): Rodell Perna, MD   Progress Note Reporting Period 03/09/18 to 03/29/18  See note below for Objective Data and Assessment of Progress/Goals.    Encounter Date: 03/29/2018  PT End of Session - 03/29/18 1541    Visit Number  3    Number of Visits  13    Date for PT Re-Evaluation  04/20/18    Authorization Type  [employer] Medicare    PT Start Time  1322   patient late   PT Stop Time  1400    PT Time Calculation (min)  38 min    Activity Tolerance  Patient tolerated treatment well    Behavior During Therapy  WFL for tasks assessed/performed       Past Medical History:  Diagnosis Date  . Anxiety    no meds  . Dyslipidemia 05/08/2013  . Essential hypertension 05/08/2013  . Palpitations 05/08/2013   Hx   . SVD (spontaneous vaginal delivery)    x 1    Past Surgical History:  Procedure Laterality Date  . CARDIAC CATHETERIZATION  04/25/2001   EF>60%  . COLONOSCOPY    . DOPPLER ECHOCARDIOGRAPHY  02/13/2009   EF>55%  . HYSTEROSCOPY N/A 07/22/2015   Procedure: HYSTEROSCOPY;  Surgeon: Alden Hipp, MD;  Location: [facility];  Service: Gynecology;  Laterality: N/A;  . NM MYOVIEW LTD  02/13/2009   EF 70%  Low risk scan  . NM PERSANTINE MYOVIEW LTD  December 2010   EF> 70%. No ischemia or infarction  . TRANSTHORACIC ECHOCARDIOGRAM  December 2010   Normal LV size and function. Mild MR and TR. Otherwise normal. Normal diastolic function.  . WISDOM TOOTH EXTRACTION      There were no vitals filed for this visit.  Subjective Assessment - 03/29/18 1324    Subjective  Reports she had cramping in L foot recently, thinks it was from her back. Was wearing a shoe with a small heel at the time. Believes that  the exercises are helping a whole lot.     Pertinent History  palpitations, HTN, HLD, cardiac cath 2003    Diagnostic tests  03/01/18 lumbar xray: Mild disc space narrowing L4-5 L5-S1 negative for acute changes. Per MD- previous MRI showing some disc protrusion on the left at L5 1 and on the right at L4-5    Patient Stated Goals  "no pain and not being stiff whenever I get up"    Currently in Pain?  No/denies                       [facility] Adult PT Treatment/Exercise - 03/29/18 0001      Self-Care   Self-Care  Other Self-Care Comments    Other Self-Care Comments   review and practice of ball on wall to B piriformis       Lumbar Exercises: Stretches   Piriformis Stretch  Right;Left;1 rep;30 seconds    Piriformis Stretch Limitations  KTOS    Other Lumbar Stretch Exercise  L & R QL stretch in door way 30" each side      Lumbar Exercises: Supine   Dead Bug  20 reps    Dead Bug Limitations  orange pball on belly  Lumbar Exercises: Sidelying   Other Sidelying Lumbar Exercises  open book stretch x10 each side   cues to decrease speed     Lumbar Exercises: Prone   Other Prone Lumbar Exercises  prone on elbows 10x3"   cues for proper form     Lumbar Exercises: Quadruped   Madcat/Old Horse  10 reps    Madcat/Old Horse Limitations  cues for rhythmic breathing      Knee/Hip Exercises: Aerobic   Recumbent Bike  L2 x 6 min      Knee/Hip Exercises: Standing   Other Standing Knee Exercises  R & L buttock self-STM with ball on wall to tolerance      Knee/Hip Exercises: Supine   Bridges  Strengthening;Both;1 set;10 reps    Bridges Limitations  straight leg bridge with B LEs on orange pball    Bridges with Clamshell  Strengthening;Both;1 set;10 reps   red TB around knees     Knee/Hip Exercises: Sidelying   Hip ABduction  Strengthening;Right;Left;1 set;10 reps    Hip ABduction Limitations  1#; cues for alignment and core contraction    Hip ADduction   Strengthening;Right;Left;1 set;10 reps    Hip ADduction Limitations  1#; more difficulty on R LE             PT Education - 03/29/18 1541    Education Details  update to HEP    Person(s) Educated  Patient    Methods  Explanation;Demonstration;Tactile cues;Verbal cues;Handout    Comprehension  Verbalized understanding;Returned demonstration       PT Short Term Goals - 03/29/18 1543      PT SHORT TERM GOAL #1   Title  Patient to be independent with initial HEP.    Time  3    Period  Weeks    Status  Achieved        PT Long Term Goals - 03/22/18 1406      PT LONG TERM GOAL #1   Title  Patient to be independent with advanced HEP.    Time  6    Period  Weeks    Status  On-going      PT LONG TERM GOAL #2   Title  Patient to demonstrate B LE strength >=4+/5.    Time  6    Period  Weeks    Status  On-going      PT LONG TERM GOAL #3   Title  Patient to demonstrate lumbar AROM without pain limiting and WFL.    Time  6    Period  Weeks    Status  On-going      PT LONG TERM GOAL #4   Title  Patient to demonstrate proper body mechanics with lifting 10lb box and without pain limiting.     Time  6    Period  Weeks    Status  On-going      PT LONG TERM GOAL #5   Title  Patient to report return to volunteering activities without pain limiting.     Time  6    Period  Weeks    Status  On-going            Plan - 03/29/18 1541    Clinical Impression Statement  Patient arrived late to session with report of improvement in pain levels with HEP, however did have an episode of L foot cramping that she associates with her back. Requesting clarification on bridge with banded resistance- good carryover after cues and  review. Introduced dead bug with cues for proper positioning with good tolerance. Introduced sidelying hip abduction and adduction with light weighted resistance- patient did report mild LB discomfort which dissipated after cues to contract core. Patient  reported good relief with open book stretch- updated HEP with this exercise for improved ROM. Requested review of self-STM with ball at end of session to B glutes- patient reported relief of tightness with this. No complaints at end of session.    Clinical Impairments Affecting Rehab Potential  palpitations, HTN, HLD, cardiac cath 2003    PT Treatment/Interventions  ADLs/Self Care Home Management;Cryotherapy;Electrical Stimulation;Functional mobility training;Stair training;Gait training;Ultrasound;Traction;Moist Heat;Therapeutic activities;Therapeutic exercise;Balance training;Neuromuscular re-education;Patient/family education;Passive range of motion;Manual techniques;Dry needling;Energy conservation;Splinting;Taping    PT Next Visit Plan  progress LE stretching and strengthening     Consulted and Agree with Plan of Care  Patient       Patient will benefit from skilled therapeutic intervention in order to improve the following deficits and impairments:  Decreased range of motion, Difficulty walking, Increased muscle spasms, Decreased activity tolerance, Pain, Impaired flexibility, Improper body mechanics, Postural dysfunction, Decreased strength  Visit Diagnosis: Chronic bilateral low back pain with left-sided sciatica  Other symptoms and signs involving the musculoskeletal system  Muscle weakness (generalized)     Problem List Patient Active Problem List   Diagnosis Date Noted  . Back pain 01/14/2018  . 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 Elevated transaminase measurement 06/28/2017  . Vitamin D deficiency, unspecified 02/22/2017  . Essential hypertension 05/08/2013  . Dyslipidemia 05/08/2013  . Palpitations 05/08/2013    Janene Harvey, PT, DPT 03/29/18 3:45 PM   [facility] [address]  [place], [place], [address] Phone: [phone]   Fax:  [phone]  Name: Nichole Walters MRN: [id number] Date of  Birth: October 24, 1950   PHYSICAL THERAPY DISCHARGE SUMMARY  Visits from Start of Care: 3  Current functional level related to goals / functional outcomes: Unable to assess; patient did not return after last session   Remaining deficits: Unable to assess   Education / Equipment: HEP  Plan: Patient agrees to discharge.  Patient goals were not met. Patient is being discharged due to not returning since the last visit.  ?????     Janene Harvey, PT, DPT 05/04/18 1:27 PM

## 2018-04-05 ENCOUNTER — Encounter (INDEPENDENT_AMBULATORY_CARE_PROVIDER_SITE_OTHER): Payer: Self-pay | Admitting: Orthopaedic Surgery

## 2018-04-05 ENCOUNTER — Ambulatory Visit (INDEPENDENT_AMBULATORY_CARE_PROVIDER_SITE_OTHER): Payer: Medicare Other | Admitting: Orthopaedic Surgery

## 2018-04-05 VITALS — BP 121/69 | HR 66 | Ht 60.0 in | Wt 140.0 lb

## 2018-04-05 DIAGNOSIS — M5416 Radiculopathy, lumbar region: Secondary | ICD-10-CM | POA: Diagnosis not present

## 2018-04-05 NOTE — Progress Notes (Signed)
Office Visit Note   Patient: Nichole Walters           Date of Birth: Jun 20, 1950           MRN: [id number] Visit Date: 04/05/2018              Requested by: Martinique, Betty G, MD [address] [place], [place] [address] PCP: Martinique, Betty G, MD   Assessment & Plan: Visit Diagnoses:  1. Radiculopathy, lumbar region     Plan: With patient's ongoing symptoms and failed conservative treatment up to this point we will repeat lumbar MRI and compared to study that was done in 2009.  Follow with Dr. Lorin Mercy after completion to discuss results and further treatment options.  Did advise her to continue home exercises that were given by therapist.  Follow-Up Instructions: Return in about 3 weeks (around 04/26/2018) for with Dr Lorin Mercy to review mri.   Orders:  Orders Placed This Encounter  Procedures  . MR Lumbar Spine w/o contrast   No orders of the defined types were placed in this encounter.     Procedures: No procedures performed   Clinical Data: No additional findings.   Subjective: Chief Complaint  Patient presents with  . Lower Back - Pain, Follow-up    HPI [age]-year-old black female returns for recheck.  Continues to have low back pain with left lower extremity radiculopathy.  Also has some pain into the right buttock and hip.  Did complete the prednisone taper and states that this did give some improvement of her symptoms.  She is also been going to formal PT without much help.  has also been doing home exercise program.  She did have lumbar MRI in 2009 which did show L3-4 L4-5 L5-S1 degenerative disc disease with L5-S1 left-sided HNP. Review of Systems No current cardiac pulmonary GI GU issues   Objective: Vital Signs: BP 121/69   Pulse 66   Ht 5' (1.524 m)   Wt 140 lb (63.5 kg)   BMI 27.34 kg/m   Physical Exam HENT:     Head: Normocephalic.     Nose: Nose normal.  Pulmonary:     Effort: Pulmonary effort is normal.  Musculoskeletal:     Comments:  Left-sided lumbar paraspinal tenderness.  Negative logroll.  Negative straight leg raise.  No focal motor deficits.  Neurovas intact.  Neurological:     Mental Status: She is alert.  Psychiatric:        Mood and Affect: Mood normal.        Behavior: Behavior normal.     Ortho Exam  Specialty Comments:  No specialty comments available.  Imaging: No results found.   PMFS History: Patient Active Problem List   Diagnosis Date Noted  . Back pain 01/14/2018  . 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 Elevated transaminase measurement 06/28/2017  . Vitamin D deficiency, unspecified 02/22/2017  . Essential hypertension 05/08/2013  . Dyslipidemia 05/08/2013  . Palpitations 05/08/2013   Past Medical History:  Diagnosis Date  . Anxiety    no meds  . Dyslipidemia 05/08/2013  . Essential hypertension 05/08/2013  . Palpitations 05/08/2013   Hx   . SVD (spontaneous vaginal delivery)    x 1    No family history on file.  Past Surgical History:  Procedure Laterality Date  . CARDIAC CATHETERIZATION  04/25/2001   EF>60%  . COLONOSCOPY    . DOPPLER ECHOCARDIOGRAPHY  02/13/2009   EF>55%  . HYSTEROSCOPY N/A 07/22/2015   Procedure:  HYSTEROSCOPY;  Surgeon: Alden Hipp, MD;  Location: [facility];  Service: Gynecology;  Laterality: N/A;  . NM MYOVIEW LTD  02/13/2009   EF 70%  Low risk scan  . NM PERSANTINE MYOVIEW LTD  December 2010   EF> 70%. No ischemia or infarction  . TRANSTHORACIC ECHOCARDIOGRAM  December 2010   Normal LV size and function. Mild MR and TR. Otherwise normal. Normal diastolic function.  . WISDOM TOOTH EXTRACTION     Social History   Occupational History  . Not on file  Tobacco Use  . Smoking status: Never Smoker  . Smokeless tobacco: Never Used  Substance and Sexual Activity  . Alcohol use: No  . Drug use: No  . Sexual activity: Yes    Birth control/protection: Post-menopausal

## 2018-04-16 ENCOUNTER — Ambulatory Visit (HOSPITAL_BASED_OUTPATIENT_CLINIC_OR_DEPARTMENT_OTHER)
Admission: RE | Admit: 2018-04-16 | Discharge: 2018-04-16 | Disposition: A | Discharge status: Home / Self Care | Payer: Medicare Other | Source: Ambulatory Visit | Attending: Surgery | Admitting: Surgery

## 2018-04-16 DIAGNOSIS — M5416 Radiculopathy, lumbar region: Secondary | ICD-10-CM | POA: Insufficient documentation

## 2018-04-16 DIAGNOSIS — M545 Low back pain: Secondary | ICD-10-CM | POA: Diagnosis not present

## 2018-05-06 ENCOUNTER — Encounter (INDEPENDENT_AMBULATORY_CARE_PROVIDER_SITE_OTHER): Payer: Self-pay | Admitting: Orthopaedic Surgery

## 2018-05-06 ENCOUNTER — Ambulatory Visit (INDEPENDENT_AMBULATORY_CARE_PROVIDER_SITE_OTHER): Payer: Medicare Other | Admitting: Orthopaedic Surgery

## 2018-05-06 VITALS — BP 121/70 | HR 65 | Ht 60.0 in | Wt 140.0 lb

## 2018-05-06 DIAGNOSIS — M5416 Radiculopathy, lumbar region: Secondary | ICD-10-CM

## 2018-05-06 NOTE — Progress Notes (Signed)
Office Visit Note   Patient: Nichole Walters           Date of Birth: 15-Apr-1950           MRN: [id number] Visit Date: 05/06/2018              Requested by: Martinique, Betty G, MD [address] [place], [place] [address] PCP: Martinique, Betty G, MD   Assessment & Plan: Visit Diagnoses:  1. Radiculopathy, lumbar region     Plan: MRI scan is reviewed.  She has subarticular stenosis at L4-5.  Subarticular and foraminal narrowing L5-S1.  She will see her GYN doctor on Monday when she already has an appointment and can review the cystic lesions that are present at the adnexa on MRI scan and decide if pelvic ultrasound or dedicated pelvic MRI is indicated.  I gave her a copy of the report to take with her.  We will set up for some epidurals with Dr. Ernestina Patches at L4-5 or L5-S1.  She can follow-up with me after the injections.  Follow-Up Instructions: Return in about 6 weeks (around 06/17/2018).   Orders:  Orders Placed This Encounter  Procedures  . Ambulatory referral to Physical Medicine Rehab   No orders of the defined types were placed in this encounter.     Procedures: No procedures performed   Clinical Data: No additional findings.   Subjective: Chief Complaint  Patient presents with  . Lower Back - Follow-up    MRI Lumbar Review    HPI 68 year old female returns with persistent problems with back pain.  She has had MRI scan obtained, lumbar which is available for review.  She is used natural health food store medications to help control the pain.She has had intermittent symptoms dating back to 2009.  Patient's had back pain that radiates into the buttocks and into her hip she took the prednisone Dosepak without significant relief.  Review of Systems 14 point update unchanged from 04/05/2018 office visit other than as mentioned in HPI.  Of note is vitamin D deficiency hypertension hyperlipidemia insomnia.   Objective: Vital Signs: BP 121/70   Pulse 65   Ht 5' (1.524 m)    Wt 140 lb (63.5 kg)   BMI 27.34 kg/m   Physical Exam Constitutional:      Appearance: She is well-developed.  HENT:     Head: Normocephalic.     Right Ear: External ear normal.     Left Ear: External ear normal.  Eyes:     Pupils: Pupils are equal, round, and reactive to light.  Neck:     Thyroid: No thyromegaly.     Trachea: No tracheal deviation.  Cardiovascular:     Rate and Rhythm: Normal rate.  Pulmonary:     Effort: Pulmonary effort is normal.  Abdominal:     Palpations: Abdomen is soft.  Skin:    General: Skin is warm and dry.  Neurological:     Mental Status: She is alert and oriented to person, place, and time.  Psychiatric:        Behavior: Behavior normal.     Ortho Exam patient has intact 3+ knee and ankle jerk right and left negative logroll to the hips.  Knee exam shows stable ligamentous findings.  She has some sciatic notch tenderness present.  Mild discomfort with extension of the lumbar spine.  Specialty Comments:  No specialty comments available.  Imaging:CLINICAL DATA:  Radiculopathy, lumbar region. Low back pain extending into the hips  on leg bilaterally since 2009. Bilateral leg weakness.  EXAM: MRI LUMBAR SPINE WITHOUT CONTRAST  TECHNIQUE: Multiplanar, multisequence MR imaging of the lumbar spine was performed. No intravenous contrast was administered.  COMPARISON:  MRI of the lumbar spine 01/06/2008  FINDINGS: Segmentation: 5 non rib-bearing lumbar type vertebral bodies are present. The lowest fully formed vertebral body is L5.  Alignment:  AP alignment is anatomic.  Vertebrae: Hemangioma near the superior endplate of L1 is stable. Chronic endplate marrow changes are present at L5-S1. Marrow signal and vertebral body heights are otherwise normal.  Conus medullaris and cauda equina: Conus extends to the L2-3 level, lower limits of normal. Conus and cauda equina appear normal.  Paraspinal and other soft tissues: Bilateral  adnexal cystic lesions are present. This may be a contiguous cystic structure. It measures up to 6 cm on the left and 5 cm on the right. It is incompletely imaged. The uterus is otherwise unremarkable. Limited imaging of the abdomen is otherwise within normal limits. No other solid organ lesions are present. There is no significant adenopathy.  Disc levels:  L1-2: Negative.  L2-3: Mild disc bulging and facet hypertrophy is present. No significant stenosis is present.  L3-4: A broad-based disc protrusion is present. Mild facet hypertrophy is noted bilaterally. Mild subarticular and foraminal narrowing is evident bilaterally.  L4-5: A right paramedian disc extrusion is noted. This results in moderate to severe right subarticular narrowing. The central canal is otherwise patent. Mild foraminal narrowing is present bilaterally.  L5-S1: A broad-based disc protrusion is asymmetric from the left. Mild facet hypertrophy is worse on the left. This results in mild left subarticular narrowing. Mild left foraminal narrowing is noted.  IMPRESSION: 1. 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 2. Mild left subarticular and foraminal narrowing at L5-S1. 3. Mild foraminal narrowing bilaterally at L3-4 and L4-5. 4. Mild subarticular narrowing at L3-4. 5. Cystic lesions of the pelvis are incompletely imaged. This may represent 1 large cystic lesion or 2 separate adnexal cystic lesions. The lesions are incompletely characterized. Recommend pelvic ultrasound or dedicated pelvic MRI for further evaluation.  These results will be called to the ordering clinician or representative by the Radiologist Assistant, and communication documented in the PACS or zVision Dashboard.   Electronically Signed   By: San Morelle M.D.   On: 04/18/2018 07:24    PMFS History: Patient Active Problem List   Diagnosis Date Noted  .  Radiculopathy, lumbar region 05/08/2018  . Back pain 01/14/2018  . 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 Elevated transaminase measurement 06/28/2017  . Vitamin D deficiency, unspecified 02/22/2017  . Essential hypertension 05/08/2013  . Dyslipidemia 05/08/2013  . Palpitations 05/08/2013   Past Medical History:  Diagnosis Date  . Anxiety    no meds  . Dyslipidemia 05/08/2013  . Essential hypertension 05/08/2013  . Palpitations 05/08/2013   Hx   . SVD (spontaneous vaginal delivery)    x 1    No family history on file.  Past Surgical History:  Procedure Laterality Date  . CARDIAC CATHETERIZATION  04/25/2001   EF>60%  . COLONOSCOPY    . DOPPLER ECHOCARDIOGRAPHY  02/13/2009   EF>55%  . HYSTEROSCOPY N/A 07/22/2015   Procedure: HYSTEROSCOPY;  Surgeon: Alden Hipp, MD;  Location: [facility];  Service: Gynecology;  Laterality: N/A;  . NM MYOVIEW LTD  02/13/2009   EF 70%  Low risk scan  . NM PERSANTINE MYOVIEW LTD  December 2010   EF>  70%. No ischemia or infarction  . TRANSTHORACIC ECHOCARDIOGRAM  December 2010   Normal LV size and function. Mild MR and TR. Otherwise normal. Normal diastolic function.  . WISDOM TOOTH EXTRACTION     Social History   Occupational History  . Not on file  Tobacco Use  . Smoking status: Never Smoker  . Smokeless tobacco: Never Used  Substance and Sexual Activity  . Alcohol use: No  . Drug use: No  . Sexual activity: Yes    Birth control/protection: Post-menopausal

## 2018-05-08 DIAGNOSIS — M5416 Radiculopathy, lumbar region: Secondary | ICD-10-CM | POA: Insufficient documentation

## 2018-05-09 ENCOUNTER — Other Ambulatory Visit: Payer: Self-pay | Admitting: Obstetrics & Gynecology

## 2018-05-09 ENCOUNTER — Other Ambulatory Visit (HOSPITAL_COMMUNITY): Payer: Self-pay | Admitting: Obstetrics & Gynecology

## 2018-05-09 DIAGNOSIS — D259 Leiomyoma of uterus, unspecified: Secondary | ICD-10-CM

## 2018-05-09 DIAGNOSIS — Z124 Encounter for screening for malignant neoplasm of cervix: Secondary | ICD-10-CM | POA: Diagnosis not present

## 2018-05-09 DIAGNOSIS — Z1231 Encounter for screening mammogram for malignant neoplasm of breast: Secondary | ICD-10-CM | POA: Diagnosis not present

## 2018-05-12 ENCOUNTER — Other Ambulatory Visit: Payer: Self-pay

## 2018-05-12 ENCOUNTER — Ambulatory Visit (HOSPITAL_COMMUNITY)
Admission: RE | Admit: 2018-05-12 | Discharge: 2018-05-12 | Disposition: A | Discharge status: Home / Self Care | Payer: Medicare Other | Source: Ambulatory Visit | Attending: Obstetrics & Gynecology | Admitting: Obstetrics & Gynecology

## 2018-05-12 DIAGNOSIS — D259 Leiomyoma of uterus, unspecified: Secondary | ICD-10-CM | POA: Diagnosis not present

## 2018-05-13 ENCOUNTER — Ambulatory Visit (INDEPENDENT_AMBULATORY_CARE_PROVIDER_SITE_OTHER): Payer: Medicare Other | Admitting: Orthopaedic Surgery

## 2018-05-18 ENCOUNTER — Other Ambulatory Visit: Payer: Self-pay | Admitting: Obstetrics & Gynecology

## 2018-05-18 DIAGNOSIS — R1900 Intra-abdominal and pelvic swelling, mass and lump, unspecified site: Secondary | ICD-10-CM

## 2018-05-21 ENCOUNTER — Other Ambulatory Visit: Payer: Medicare Other

## 2018-05-24 ENCOUNTER — Ambulatory Visit
Admission: RE | Admit: 2018-05-24 | Discharge: 2018-05-24 | Disposition: A | Discharge status: Home / Self Care | Payer: Medicare Other | Source: Ambulatory Visit | Attending: Obstetrics & Gynecology | Admitting: Obstetrics & Gynecology

## 2018-05-24 ENCOUNTER — Other Ambulatory Visit: Payer: Self-pay

## 2018-05-24 DIAGNOSIS — R1900 Intra-abdominal and pelvic swelling, mass and lump, unspecified site: Secondary | ICD-10-CM

## 2018-05-24 MED ORDER — GADOBENATE DIMEGLUMINE 529 MG/ML IV SOLN
13.0000 mL | Freq: Once | INTRAVENOUS | Status: AC | PRN
  Administered 2018-05-24: 13 mL via INTRAVENOUS

## 2018-05-27 ENCOUNTER — Ambulatory Visit: Payer: Medicare HMO | Admitting: Family Medicine

## 2018-06-27 ENCOUNTER — Other Ambulatory Visit: Payer: Self-pay | Admitting: Family Medicine

## 2018-07-18 DIAGNOSIS — R9389 Abnormal findings on diagnostic imaging of other specified body structures: Secondary | ICD-10-CM | POA: Diagnosis not present

## 2018-07-28 ENCOUNTER — Telehealth: Payer: Self-pay | Admitting: *Deleted

## 2018-07-28 NOTE — Telephone Encounter (Signed)
Called and spoke with the patient regarding her new patient appt on 6/8. Gave the address, checking procedure and parking/visitor/mask policy.

## 2018-08-01 DIAGNOSIS — H401132 Primary open-angle glaucoma, bilateral, moderate stage: Secondary | ICD-10-CM | POA: Diagnosis not present

## 2018-08-05 ENCOUNTER — Inpatient Hospital Stay: Payer: Medicare Other | Attending: Gynecologic Oncology | Admitting: Gynecologic Oncology

## 2018-08-05 ENCOUNTER — Encounter: Payer: Self-pay | Admitting: Gynecologic Oncology

## 2018-08-05 ENCOUNTER — Other Ambulatory Visit: Payer: Self-pay

## 2018-08-05 VITALS — BP 125/61 | HR 76 | Temp 97.8°F | Resp 19 | Ht 60.0 in | Wt 131.6 lb

## 2018-08-05 DIAGNOSIS — N83202 Unspecified ovarian cyst, left side: Secondary | ICD-10-CM

## 2018-08-05 DIAGNOSIS — Z90710 Acquired absence of both cervix and uterus: Secondary | ICD-10-CM | POA: Diagnosis not present

## 2018-08-05 DIAGNOSIS — D259 Leiomyoma of uterus, unspecified: Secondary | ICD-10-CM | POA: Diagnosis not present

## 2018-08-05 DIAGNOSIS — Z90722 Acquired absence of ovaries, bilateral: Secondary | ICD-10-CM | POA: Diagnosis not present

## 2018-08-05 DIAGNOSIS — E7800 Pure hypercholesterolemia, unspecified: Secondary | ICD-10-CM | POA: Diagnosis not present

## 2018-08-05 DIAGNOSIS — Z1273 Encounter for screening for malignant neoplasm of ovary: Secondary | ICD-10-CM | POA: Insufficient documentation

## 2018-08-05 DIAGNOSIS — N949 Unspecified condition associated with female genital organs and menstrual cycle: Secondary | ICD-10-CM

## 2018-08-05 DIAGNOSIS — N83201 Unspecified ovarian cyst, right side: Secondary | ICD-10-CM

## 2018-08-05 DIAGNOSIS — D219 Benign neoplasm of connective and other soft tissue, unspecified: Secondary | ICD-10-CM

## 2018-08-05 DIAGNOSIS — I10 Essential (primary) hypertension: Secondary | ICD-10-CM | POA: Insufficient documentation

## 2018-08-05 DIAGNOSIS — Z1159 Encounter for screening for other viral diseases: Secondary | ICD-10-CM | POA: Diagnosis not present

## 2018-08-05 NOTE — Patient Instructions (Signed)
Dr Denman George is recommending one of 3 options for your uterine fibroids/ovarian cysts.  1/ close follow-up with a repeat US in 6 months.  2/ surgery with removal of the uterus (and its fibroids), and both ovaries and tubes. All would be tested for cancer.  3/ surgery with removal of just the ovaries and tubes. These would be tested for cancer.  All 3 options are reasonable provided the tumor marker (CA 125) that she drew today returns as normal.  Her office can be reached at [phone] when you have determined how you would like to proceed.

## 2018-08-05 NOTE — Progress Notes (Signed)
Consult Note: Gyn-Onc  Consult was requested by Dr. Stann Mainland for the evaluation of Seth Friedlander 68 y.o. female  CC:  Chief Complaint  Patient presents with  . Adnexal cyst  . Fibroids    Assessment/Plan:  Ms. Gwenna Fuston  is a 68 y.o.  year old with bilateral ovarian cysts and uterine fibroids.  I reviewed her MRI pelvis from March, 2020. I reviewed the images with the patient. The images look reassuring overall and I have a low suspicion for ovarian cancer or sarcoma. I discussed that we will check CA 125 today and if normal, this will be further reassurance.  I discussed that we have 3 options to proceed:  1/ close follow-up with surveillance [place] with Dr Stann Mainland in 6 months. 2/ proceeding with surgical evaluation with robotic BSO to gain pathology information 3/ proceeding with surgery with hysterectomy/BSO for pathology and definitive management. I explained that this would be the most radical procedure with the most risks and recovery, although, she is a good surgical candidate overall with a favorable risk profile for surgery. I discussed common risks of surgery including  bleeding, infection, damage to internal organs (such as bladder,ureters, bowels), blood clot, reoperation and rehospitalization. I explained that this would be a same-day discharge outpatient procedure and discussed anticipated recovery time and restrictions.  After her counseling the patient has elected to consider things further and will notify us when she has determined how she would like to proceed.   HPI: Ms Lavine Hargrove is a 68 year old P1 who is seen in consultation at the request of Dr Stann Mainland for a fibroid uterus and bilateral ovarian cysts.  The patient reports that she has been known to have fibroids for many years. She had an episode of vaginal bleeding in 2017 which was managed with a hysteroscopy D&C and polypectomy for benign pathology. She had no bleeding since that time.  She underwent MRI  spine in early 2020 for back pain. This detected ovarian cysts and she then underwent a TVUS on 05/12/18. This showed a uterus measuring 8.8x4.3x8cm with fibroids and an endometrial thickness of 48mm. A fundal fibroid measured 1.7cm and a right lateral fibroid measured 3.8cm. The right ovary contained a simple cyst measuring 4.8x3.1x3.1cm nd hte left ovary contained a simple cyst measuring 7.2x5x5.9cm.   An MRI was performed of the pelvis on 05/24/18 which showed am 8x4x6cm uterus with several uterine fibroids again noted including an exophytic fibroid projecting from the lower anterior segment on the left measuring 4.1cm. There was a 5cm simple cyst on the right and a 7.4cm left ovarian simple cyst. No ascites or peritoneal nodularity was seen.  Endometrial biopsy was performed on 5//18/20 and revealed simple hyperplasia without atypia. Pap was normal.   She is an otherwise a healthy woman with some hypertension and hypercholesterolemia.  She has never had abdominal surgery. She had one prior vaginal delivery.   Current Meds:  Outpatient Encounter Medications as of 08/05/2018  Medication Sig  . amLODipine (NORVASC) 5 MG tablet TAKE 1 TABLET BY MOUTH EVERY DAY  . calcium carbonate (CALTRATE 600) 1500 (600 Ca) MG TABS tablet Take 600 mg of elemental calcium by mouth 2 (two) times daily with a meal.  . cholecalciferol (VITAMIN D) 1000 units tablet Take 1,000 Units by mouth daily.  Marland Kitchen latanoprost (XALATAN) 0.005 % ophthalmic solution Place 1 drop into both eyes at bedtime.  Marland Kitchen OVER THE COUNTER MEDICATION Take 2 capsules by mouth 3 (three) times daily. Ligaplex  II 5300 - herbal supplement  . pravastatin (PRAVACHOL) 20 MG tablet TAKE 1 TABLET BY MOUTH EVERY DAY  . valsartan-hydrochlorothiazide (DIOVAN HCT) 160-12.5 MG tablet Take 1 tablet by mouth daily.  Marland Kitchen Zoster Vaccine Adjuvanted [facility]) injection 0.5 ml in muscle and repeat in 8 weeks (Patient not taking: Reported on 03/09/2018)  . [DISCONTINUED]  doxepin (SINEQUAN) 10 MG/ML solution Take 0.3-0.6 mLs (3-6 mg total) by mouth at bedtime. (Patient not taking: Reported on 03/09/2018)  . [DISCONTINUED] predniSONE (DELTASONE) 5 MG tablet Take 6 then 5 then 4,3,2,1 tablets daily then stop.  Take with food each day take one less tablet (Patient not taking: Reported on 03/09/2018)   No facility-administered encounter medications on file as of 08/05/2018.     Allergy:  Allergies  Allergen Reactions  . Celexa [Citalopram] Swelling    Mouth swells    Social Hx:   Social History   Socioeconomic History  . Marital status: Single    Spouse name: Not on file  . Number of children: Not on file  . Years of education: Not on file  . Highest education level: Not on file  Occupational History  . Not on file  Social Needs  . Financial resource strain: Not on file  . Food insecurity:    Worry: Not on file    Inability: Not on file  . Transportation needs:    Medical: Not on file    Non-medical: Not on file  Tobacco Use  . Smoking status: Never Smoker  . Smokeless tobacco: Never Used  Substance and Sexual Activity  . Alcohol use: No  . Drug use: No  . Sexual activity: Not Currently    Birth control/protection: Post-menopausal  Lifestyle  . Physical activity:    Days per week: Not on file    Minutes per session: Not on file  . Stress: Not on file  Relationships  . Social connections:    Talks on phone: Not on file    Gets together: Not on file    Attends religious service: Not on file    Active member of club or organization: Not on file    Attends meetings of clubs or organizations: Not on file    Relationship status: Not on file  . Intimate partner violence:    Fear of current or ex partner: Not on file    Emotionally abused: Not on file    Physically abused: Not on file    Forced sexual activity: Not on file  Other Topics Concern  . Not on file  Social History Narrative   Single mother of one, grandmother 72, great-grandmother  2.   Does not smoke, does not drink.   Exercises usually on treadmill mostly 3 days a week.    Past Surgical Hx:  Past Surgical History:  Procedure Laterality Date  . CARDIAC CATHETERIZATION  04/25/2001   EF>60%  . COLONOSCOPY    . DOPPLER ECHOCARDIOGRAPHY  02/13/2009   EF>55%  . HYSTEROSCOPY N/A 07/22/2015   Procedure: HYSTEROSCOPY;  Surgeon: Alden Hipp, MD;  Location: [facility];  Service: Gynecology;  Laterality: N/A;  . NM MYOVIEW LTD  02/13/2009   EF 70%  Low risk scan  . NM PERSANTINE MYOVIEW LTD  December 2010   EF> 70%. No ischemia or infarction  . TRANSTHORACIC ECHOCARDIOGRAM  December 2010   Normal LV size and function. Mild MR and TR. Otherwise normal. Normal diastolic function.  . WISDOM TOOTH EXTRACTION      Past Medical Hx:  Past Medical History:  Diagnosis Date  . Anxiety    no meds  . Dyslipidemia 05/08/2013  . Essential hypertension 05/08/2013  . Palpitations 05/08/2013   Hx   . SVD (spontaneous vaginal delivery)    x 1    Past Gynecological History:  SVD x 1 No LMP recorded. Patient is postmenopausal.  Family Hx:  Family History  Problem Relation Age of Onset  . Colon cancer Sister   . Colon cancer Brother   . Colon cancer Cousin     Review of Systems:  Constitutional  Feels well,    ENT Normal appearing ears and nares bilaterally Skin/Breast  No rash, sores, jaundice, itching, dryness Cardiovascular  No chest pain, shortness of breath, or edema  Pulmonary  No cough or wheeze.  Gastro Intestinal  No nausea, vomitting, or diarrhoea. No bright red blood per rectum, no abdominal pain, change in bowel movement, or constipation.  Genito Urinary  No frequency, urgency, dysuria, no bleeding Musculo Skeletal  No myalgia, arthralgia, joint swelling or pain  Neurologic  No weakness, numbness, change in gait,  Psychology  No depression, anxiety, insomnia.   Vitals:  Blood pressure 125/61, pulse 76, temperature 97.8 F (36.6 C), temperature source  Oral, resp. rate 19, height 5' (1.524 m), weight 131 lb 9.6 oz (59.7 kg), SpO2 99 %.  Physical Exam: WD in NAD Neck  Supple NROM, without any enlargements.  Lymph Node Survey No cervical supraclavicular or inguinal adenopathy Cardiovascular  Pulse normal rate, regularity and rhythm. S1 and S2 normal.  Lungs  Clear to auscultation bilateraly, without wheezes/crackles/rhonchi. Good air movement.  Skin  No rash/lesions/breakdown  Psychiatry  Alert and oriented to person, place, and time  Abdomen  Normoactive bowel sounds, abdomen soft, non-tender and nonobese without evidence of hernia.  Back No CVA tenderness Genito Urinary  Vulva/vagina: Normal external female genitalia.   No lesions. No discharge or bleeding.  Bladder/urethra:  No lesions or masses, well supported bladder  Vagina: normal  Cervix: Normal appearing, no lesions.  Uterus:  Bulky, mobile, no parametrial involvement or nodularity.  Adnexa: no discretely palpable masses. Rectal  Good tone, no masses no cul de sac nodularity.  Extremities  No bilateral cyanosis, clubbing or edema.   Thereasa Solo, MD  08/05/2018, 3:59 PM

## 2018-08-06 LAB — CA 125: Cancer Antigen (CA) 125: 8.2 U/mL (ref 0.0–38.1)

## 2018-08-09 ENCOUNTER — Telehealth: Payer: Self-pay | Admitting: Gynecologic Oncology

## 2018-08-09 NOTE — Telephone Encounter (Signed)
Attempted to call patient to discuss CA 125 results and to see if she had decided on how she wanted to move forward (surgery? Etc). Left message asking her to please call the office to discuss.

## 2018-08-10 ENCOUNTER — Telehealth: Payer: Self-pay | Admitting: Gynecologic Oncology

## 2018-08-10 NOTE — Telephone Encounter (Signed)
Returned call to patient. Informed her of CA 125 results.  Discussed Dr. Serita Grit recommendations (see note from 08/05/2018 visit). Patient states she probably wants surgery but not during [place] and she is trying to sell a house.  She states she would "probably want to take everything out."  She states she would like to contact the office if she would like to proceed with surgery in the future.  All questions answered. Also advised her to call for any questions or concerns.

## 2018-08-24 ENCOUNTER — Telehealth: Payer: Self-pay

## 2018-08-24 NOTE — Telephone Encounter (Signed)
Faxed office notes and labs from 08-05-2018 visit as rquested from Dr. Gwynne Edinger  office.

## 2018-09-13 ENCOUNTER — Telehealth: Payer: Self-pay | Admitting: Cardiology

## 2018-09-13 NOTE — Telephone Encounter (Signed)
Left message for patient to return call to inform that a 90 day supply can't be given until upcoming appointment.

## 2018-09-13 NOTE — Telephone Encounter (Signed)
New message    *STAT* If patient is at the pharmacy, call can be transferred to refill team.   1. Which medications need to be refilled? (please list name of each medication and dose if known) valsartan-hydrochlorothiazide (DIOVAN HCT) 160-12.5 MG tablet  2. Which pharmacy/location (including street and city if local pharmacy) is medication to be sent to?[id number] - [place], [place] - [address] AT [address]OSS FROM [address]  3. Do they need a 30 day or 90 day supply? Nichole Walters

## 2018-09-14 ENCOUNTER — Telehealth: Payer: Self-pay | Admitting: *Deleted

## 2018-09-14 NOTE — Telephone Encounter (Signed)
Spoke with patient - patient states she would like to proceed with virtual visit for 09/14/18.   instruction given on how visit will proceed.  she voice understanding.       TELEPHONE CALL NOTE  This patient has been deemed a candidate for follow-up tele-health visit to limit community exposure during the Covid-19 pandemic. I spoke with the patient via phone to discuss instructions. TThe patient will receive a phone call 2-3 days prior to their E-Visit at which time consent will be verbally confirmed.   A Virtual Office Visit appointment type has been scheduled for 7/16 with [facility], with "VIDEO/TEXT"      Raiford Simmonds, RN 09/14/2018 5:35 PM

## 2018-09-15 ENCOUNTER — Telehealth (INDEPENDENT_AMBULATORY_CARE_PROVIDER_SITE_OTHER): Payer: Medicare Other | Admitting: Cardiology

## 2018-09-15 ENCOUNTER — Encounter: Payer: Self-pay | Admitting: Cardiology

## 2018-09-15 ENCOUNTER — Telehealth: Payer: Self-pay | Admitting: Cardiology

## 2018-09-15 DIAGNOSIS — E785 Hyperlipidemia, unspecified: Secondary | ICD-10-CM

## 2018-09-15 DIAGNOSIS — R002 Palpitations: Secondary | ICD-10-CM | POA: Diagnosis not present

## 2018-09-15 DIAGNOSIS — I10 Essential (primary) hypertension: Secondary | ICD-10-CM | POA: Diagnosis not present

## 2018-09-15 MED ORDER — AMLODIPINE BESYLATE 5 MG PO TABS: 5.0000 mg | ORAL_TABLET | Freq: Every day | ORAL | 3 refills | Status: DC

## 2018-09-15 NOTE — Telephone Encounter (Signed)
1. Confirm consent - "In the setting of the current Covid19 crisis, you are scheduled for a (phone or video) visit with your provider on (date) at (time).  Just as we do with many in-office visits, in order for you to participate in this visit, we must obtain consent.  If you'd like, I can send this to your mychart (if signed up) or email for you to review.  Otherwise, I can obtain your verbal consent now.  All virtual visits are billed to your insurance company just like a normal visit would be.  By agreeing to a virtual visit, we'd like you to understand that the technology does not allow for your provider to perform an examination, and thus may limit your provider's ability to fully assess your condition. If your provider identifies any concerns that need to be evaluated in person, we will make arrangements to do so.  Finally, though the technology is pretty good, we cannot assure that it will always work on either your or our end, and in the setting of a video visit, we may have to convert it to a phone-only visit.  In either situation, we cannot ensure that we have a secure connection.  Are you willing to proceed?" STAFF: Did the patient verbally acknowledge consent to telehealth visit? Document YES/NO here: Yes  [place] VISIT   I hereby voluntarily request, consent and authorize [facility] and its employed or contracted physicians, physician assistants, nurse practitioners or other licensed health care professionals (the Practitioner), to provide me with telemedicine health care services (the Services") as deemed necessary by the treating Practitioner. I acknowledge and consent to receive the Services by the Practitioner via telemedicine. I understand that the telemedicine visit will involve communicating with the Practitioner through live audiovisual communication technology and the disclosure of certain medical information by electronic transmission. I acknowledge  that I have been given the opportunity to request an in-person assessment or other available alternative prior to the telemedicine visit and am voluntarily participating in the telemedicine visit.  I understand that I have the right to withhold or withdraw my consent to the use of telemedicine in the course of my care at any time, without affecting my right to future care or treatment, and that the Practitioner or I may terminate the telemedicine visit at any time. I understand that I have the right to inspect all information obtained and/or recorded in the course of the telemedicine visit and may receive copies of available information for a reasonable fee.  I understand that some of the potential risks of receiving the Services via telemedicine include:   Delay or interruption in medical evaluation due to technological equipment failure or disruption;  Information transmitted may not be sufficient (e.g. poor resolution of images) to allow for appropriate medical decision making by the Practitioner; and/or   In rare instances, security protocols could fail, causing a breach of personal health information.  Furthermore, I acknowledge that it is my responsibility to provide information about my medical history, conditions and care that is complete and accurate to the best of my ability. I acknowledge that Practitioner's advice, recommendations, and/or decision may be based on factors not within their control, such as incomplete or inaccurate data provided by me or distortions of diagnostic images or specimens that may result from electronic transmissions. I understand that the practice of medicine is not an exact science and that Practitioner makes no warranties or guarantees regarding treatment outcomes. I acknowledge  that I will receive a copy of this consent concurrently upon execution via email to the email address I last provided but may also request a printed copy by calling the office of [place].    I understand that my insurance will be billed for this visit.   I have read or had this consent read to me.  I understand the contents of this consent, which adequately explains the benefits and risks of the Services being provided via telemedicine.   I have been provided ample opportunity to ask questions regarding this consent and the Services and have had my questions answered to my satisfaction.  I give my informed consent for the services to be provided through the use of telemedicine in my medical care  By participating in this telemedicine visit I agree to the above.

## 2018-09-15 NOTE — Progress Notes (Signed)
Virtual Visit via Telephone Note   This visit type was conducted due to national recommendations for restrictions regarding the COVID-19 Pandemic (e.g. social distancing) in an effort to limit this patient's exposure and mitigate transmission in our community.  Due to her co-morbid illnesses, this patient is at least at moderate risk for complications without adequate follow up.  This format is felt to be most appropriate for this patient at this time.  The patient did not have access to video technology/had technical difficulties with video requiring transitioning to audio format only (telephone).  All issues noted in this document were discussed and addressed.  No physical exam could be performed with this format.  Please refer to the patient's chart for her  consent to telehealth for [facility].   Patient has given verbal permission to conduct this visit via virtual appointment and to bill insurance 09/17/2018 6:28 PM     Evaluation Performed:  Follow-up visit  Date:  09/17/2018   ID:  Nichole Walters, Nichole Walters 03-16-1950, MRN [id number]  Patient Location: Home Provider Location: Office  PCP:  [place], Betty G, MD  Cardiologist:   Glenetta Hew, MD  Electrophysiologist:  None   Chief Complaint:  Annual Follow-up  History of Present Illness:    Nichole Walters is a 68 y.o. female with PMH notable for Palplitaions who presents via audio/video conferencing for a telehealth visit today.  Nichole Walters was last seen in July 2019  Interval History:  Doing well - walks most days, but also does home stationary bike & TM.  Can't do Silver Sneakers.  Recent flare-up of back pain in Nov-Dec 2019 -- has had 2 MRIs (taking MegaPlex); but that showed other issues - Ovarian cysts.   Cardiovascular ROS: no chest pain or dyspnea on exertion negative for - chest pain, dyspnea on exertion, edema, irregular heartbeat, loss of consciousness, orthopnea, palpitations, paroxysmal nocturnal  dyspnea, rapid heart rate, shortness of breath or TIA/amaurosis fugax  The patient does not have symptoms concerning for COVID-19 infection (fever, chills, cough, or new shortness of breath).  The patient is practicing social distancing.  Does go out to walk, but if not does 1 hr on TM inside.  ROS:  Please see the history of present illness.    Review of Systems  Constitutional: Negative for malaise/fatigue and weight loss.  Respiratory: Negative for cough and shortness of breath.   Neurological: Negative for dizziness.  Psychiatric/Behavioral: Negative for memory loss. The patient is not nervous/anxious.     Past Medical History:  Diagnosis Date  . Anxiety    no meds  . Dyslipidemia 05/08/2013  . Essential hypertension 05/08/2013  . Palpitations 05/08/2013   Hx   . SVD (spontaneous vaginal delivery)    x 1   Past Surgical History:  Procedure Laterality Date  . CARDIAC CATHETERIZATION  04/25/2001   EF>60%  . COLONOSCOPY    . DOPPLER ECHOCARDIOGRAPHY  02/13/2009   EF>55%  . HYSTEROSCOPY N/A 07/22/2015   Procedure: HYSTEROSCOPY;  Surgeon: Alden Hipp, MD;  Location: [facility];  Service: Gynecology;  Laterality: N/A;  . NM MYOVIEW LTD  02/13/2009   EF 70%  Low risk scan  . NM PERSANTINE MYOVIEW LTD  December 2010   EF> 70%. No ischemia or infarction  . TRANSTHORACIC ECHOCARDIOGRAM  December 2010   Normal LV size and function. Mild MR and TR. Otherwise normal. Normal diastolic function.  . WISDOM TOOTH EXTRACTION       Current Meds  Medication Sig  . amLODipine (NORVASC) 5 MG tablet Take 1 tablet (5 mg total) by mouth daily.  . calcium carbonate (CALTRATE 600) 1500 (600 Ca) MG TABS tablet Take 600 mg of elemental calcium by mouth 2 (two) times daily with a meal.  . cholecalciferol (VITAMIN D) 1000 units tablet Take 1,000 Units by mouth daily.  Marland Kitchen latanoprost (XALATAN) 0.005 % ophthalmic solution Place 1 drop into both eyes at bedtime.  Marland Kitchen OVER THE COUNTER MEDICATION Take 2 capsules  by mouth 3 (three) times daily. Ligaplex II 5300 - herbal supplement  . pravastatin (PRAVACHOL) 20 MG tablet TAKE 1 TABLET BY MOUTH EVERY DAY  . [DISCONTINUED] amLODipine (NORVASC) 5 MG tablet TAKE 1 TABLET BY MOUTH EVERY DAY  . [DISCONTINUED] valsartan-hydrochlorothiazide (DIOVAN HCT) 160-12.5 MG tablet Take 1 tablet by mouth daily.     Allergies:   Celexa [citalopram]   Social History   Tobacco Use  . Smoking status: Never Smoker  . Smokeless tobacco: Never Used  Substance Use Topics  . Alcohol use: No  . Drug use: No     Family Hx: The patient's family history includes Colon cancer in her brother, cousin, and sister.   Prior CV studies:   The following studies were reviewed today: . none:  Labs/Other Tests and Data Reviewed:    EKG:  No ECG reviewed.  Recent Labs: No results found for requested labs within last 8760 hours.   Recent Lipid Panel Lab Results  Component Value Date/Time   CHOL 194 06/28/2017 12:44 PM   TRIG 85.0 06/28/2017 12:44 PM   HDL 50.00 06/28/2017 12:44 PM   CHOLHDL 4 06/28/2017 12:44 PM   LDLCALC 127 (H) 06/28/2017 12:44 PM    Wt Readings from Last 3 Encounters:  09/15/18 139 lb (63 kg)  08/05/18 131 lb 9.6 oz (59.7 kg)  05/06/18 140 lb (63.5 kg)     Objective:    Vital Signs:  BP 108/74   Pulse (!) 59   Ht 5' (1.524 m)   Wt 139 lb (63 kg)   BMI 27.15 kg/m   VITAL SIGNS:  reviewed GEN:  no acute distress RESPIRATORY:  non-labored NEURO:  alert and oriented x 3, no obvious focal deficit PSYCH:  normal affect  ASSESSMENT & PLAN:    Problem List Items Addressed This Visit    Palpitations (Chronic)    Doing well.  No longer on beta-blocker.  Would only consider restarting beta-blocker if symptoms recur.      Essential hypertension (Chronic)    Blood pressures well controlled with current dose of amlodipine.  Does not appear that she is actually on her ARB anymore.  Seems to be improved with cutting down salt intake and  increasing exercise.      Relevant Medications   amLODipine (NORVASC) 5 MG tablet   Dyslipidemia (Chronic)    Overdue for labs to be checked by PCP.  Is on pravastatin.  Labs being followed by PCP.  Will defer.         COVID-19 Education: The signs and symptoms of COVID-19 were discussed with the patient and how to seek care for testing (follow up with PCP or arrange E-visit).   The importance of social distancing was discussed today.  Time:   Today, I have spent 12 minutes with the patient with telehealth technology discussing the above problems.     Medication Adjustments/Labs and Tests Ordered: Current medicines are reviewed at length with the patient today.  Concerns regarding medicines are  outlined above.  Medication Instructions: no change  Tests Ordered: No orders of the defined types were placed in this encounter.   Medication Changes: Meds ordered this encounter  Medications  . amLODipine (NORVASC) 5 MG tablet    Sig: Take 1 tablet (5 mg total) by mouth daily.    Dispense:  90 tablet    Refill:  3    Disposition:  Follow up in 1 year(s)    Signed, Glenetta Hew, MD  09/17/2018 6:28 PM    [place]

## 2018-09-15 NOTE — Telephone Encounter (Signed)
LVM, reminding pt of her appt with Dr Ellyn Hack on 09-15-18. I also left a message for pt to call and give consent for Virtual Visit.

## 2018-09-15 NOTE — Patient Instructions (Addendum)
Medication Instructions:  No changes If you need a refill on your cardiac medications before your next appointment, please call your pharmacy.   Lab work: Not needed .  Testing/Procedures:  Not needed  Follow-Up: At [facility], you and your health needs are our priority.  As part of our continuing mission to provide you with exceptional heart care, we have created designated Provider Care Teams.  These Care Teams include your primary Cardiologist (physician) and Advanced Practice Providers (APPs -  Physician Assistants and Nurse Practitioners) who all work together to provide you with the care you need, when you need it. . You will need a follow up appointment in 12 months July 2021.  Please call our office 2 months in advance to schedule this appointment.  You may see Glenetta Hew, MD or one of the following Advanced Practice Providers on your designated Care Team:   . Rosaria Ferries, PA-C . Jory Sims, DNP, ANP  Any Other Special Instructions Will Be Listed Below (If Applicable).

## 2018-09-16 ENCOUNTER — Other Ambulatory Visit: Payer: Self-pay | Admitting: Cardiology

## 2018-09-16 ENCOUNTER — Telehealth: Payer: Self-pay | Admitting: *Deleted

## 2018-09-16 NOTE — Telephone Encounter (Signed)
LATE ENTRY - SPOKE TO PATIENT INFORMATION GIVEN FROM TELE-VISIT - MAILED AVS SUMMARY . PATIENT VERBALIZED UNDERSTANDING.

## 2018-09-17 NOTE — Assessment & Plan Note (Addendum)
Blood pressures well controlled with current dose of amlodipine.  Does not appear that she is actually on her ARB anymore.  Seems to be improved with cutting down salt intake and increasing exercise.

## 2018-09-17 NOTE — Assessment & Plan Note (Signed)
Doing well.  No longer on beta-blocker.  Would only consider restarting beta-blocker if symptoms recur.

## 2018-09-17 NOTE — Assessment & Plan Note (Signed)
Overdue for labs to be checked by PCP.  Is on pravastatin.  Labs being followed by PCP.  Will defer.

## 2018-12-28 ENCOUNTER — Encounter: Payer: Self-pay | Admitting: Family Medicine

## 2018-12-28 ENCOUNTER — Ambulatory Visit (INDEPENDENT_AMBULATORY_CARE_PROVIDER_SITE_OTHER): Payer: Medicare Other | Admitting: Family Medicine

## 2018-12-28 ENCOUNTER — Other Ambulatory Visit: Payer: Self-pay

## 2018-12-28 VITALS — BP 124/64 | HR 78 | Temp 97.4°F | Resp 16 | Ht 60.0 in | Wt 141.8 lb

## 2018-12-28 DIAGNOSIS — Z0000 Encounter for general adult medical examination without abnormal findings: Secondary | ICD-10-CM

## 2018-12-28 DIAGNOSIS — E559 Vitamin D deficiency, unspecified: Secondary | ICD-10-CM

## 2018-12-28 DIAGNOSIS — Z23 Encounter for immunization: Secondary | ICD-10-CM

## 2018-12-28 DIAGNOSIS — E785 Hyperlipidemia, unspecified: Secondary | ICD-10-CM | POA: Diagnosis not present

## 2018-12-28 DIAGNOSIS — I10 Essential (primary) hypertension: Secondary | ICD-10-CM

## 2018-12-28 DIAGNOSIS — R7401 Elevation of levels of liver transaminase levels: Secondary | ICD-10-CM

## 2018-12-28 DIAGNOSIS — Z780 Asymptomatic menopausal state: Secondary | ICD-10-CM

## 2018-12-28 LAB — COMPREHENSIVE METABOLIC PANEL
ALT: 16 U/L (ref 0–35)
AST: 22 U/L (ref 0–37)
Albumin: 4.2 g/dL (ref 3.5–5.2)
Alkaline Phosphatase: 60 U/L (ref 39–117)
BUN: 10 mg/dL (ref 6–23)
CO2: 31 mEq/L (ref 19–32)
Calcium: 9.6 mg/dL (ref 8.4–10.5)
Chloride: 103 mEq/L (ref 96–112)
Creatinine, Ser: 0.67 mg/dL (ref 0.40–1.20)
GFR: 106.45 mL/min (ref >60.00)
Glucose, Bld: 83 mg/dL (ref 70–99)
Potassium: 3.8 mEq/L (ref 3.5–5.1)
Sodium: 140 mEq/L (ref 135–145)
Total Bilirubin: 0.6 mg/dL (ref 0.2–1.2)
Total Protein: 7.5 g/dL (ref 6.0–8.3)

## 2018-12-28 LAB — LIPID PANEL
Cholesterol: 175 mg/dL (ref 0–200)
HDL: 53.1 mg/dL (ref >39.00)
LDL Cholesterol: 105 mg/dL — ABNORMAL HIGH (ref 0–99)
NonHDL: 121.87
Total CHOL/HDL Ratio: 3
Triglycerides: 85 mg/dL (ref 0.0–149.0)
VLDL: 17 mg/dL (ref 0.0–40.0)

## 2018-12-28 NOTE — Progress Notes (Signed)
HPI:   [name] is a 68 y.o. female, who is here today for her routine physical.  Last CPE: > a year ago.  Regular exercise 3 or more time per week: She has not exercise for the past couple months but she was walking the treadmill. Following a healthy diet: Usually she does but when she stays with her brother she does not. She lives alone. She has her brother living in [place], she helps her sister in law with his case, hx of Alzheimer's.  Chronic medical problems: HTN,HLD,vit D def,abnormal LFT's, and chronic back among some.   Immunization History  Administered Date(s) Administered  . Hep A / Hep B 03/09/2017, 04/12/2017, 09/06/2017  . Pneumococcal Conjugate-13 09/06/2017  . Pneumococcal Polysaccharide-23 12/28/2018  . Tdap 07/17/2016    Mammogram: 05/09/2018. She follows with gyn annually. Colonoscopy: 08/01/2015 DEXA: 2017.  Hep C screening : 01/2017 NR  AWV last done 09/06/2017.  Independent ADL's and IADL's. No falls in the past year and denies depression symptoms.  Functional Status Survey: Is the patient deaf or have difficulty hearing?: No Does the patient have difficulty seeing, even when wearing glasses/contacts?: No Does the patient have difficulty concentrating, remembering, or making decisions?: No Does the patient have difficulty walking or climbing stairs?: No Does the patient have difficulty dressing or bathing?: No Does the patient have difficulty doing errands alone such as visiting a doctor's office or shopping?: No  Fall Risk  12/28/2018 09/06/2017  Falls in the past year? 0 No  Number falls in past yr: 0 -  Injury with Fall? 0 -  Risk for fall due to : Orthopedic patient -  Follow up Education provided -   Providers she sees regularly: Eye care provider: Dr Gwynn Walters, last seen 07/2018 Gyn, Nichole Walters Cardiologist, Nichole Walters. Ortho, Nichole Walters.  Depression screen [facility] 2/9 12/28/2018  Decreased Interest 0  Down, Depressed,  Hopeless 0  PHQ - 2 Score 0    Mini-Cog - 12/28/18 1255    Normal clock drawing test?  yes    How many words correct?  3        Hearing Screening   125Hz  250Hz  500Hz  1000Hz  2000Hz  3000Hz  4000Hz  6000Hz  8000Hz   Right ear:           Left ear:             Visual Acuity Screening   Right eye Left eye Both eyes  Without correction:     With correction: 20/25 20/200 20/30    She has no concerns today. Denies alcohol abuse.  Lab Results  Component Value Date   ALT 39 (H) 06/28/2017   AST 37 06/28/2017   ALKPHOS 58 06/28/2017   BILITOT 0.8 06/28/2017   Lab Results  Component Value Date   CREATININE 0.67 06/28/2017   BUN 9 06/28/2017   NA 140 06/28/2017   K 4.1 06/28/2017   CL 103 06/28/2017   CO2 31 06/28/2017   HLD:She is on Pravastatin 20 mg daily.  Lab Results  Component Value Date   CHOL 194 06/28/2017   HDL 50.00 06/28/2017   LDLCALC 127 (H) 06/28/2017   TRIG 85.0 06/28/2017   CHOLHDL 4 06/28/2017   HTN:She is on Valsartan-HCTZ 160-12.5 mg daily and Amlodipine 5 mg daily.  Vit D def: She is on Vit D 1000 U daily.  Tolerating medications well.  Review of Systems  Constitutional: Negative for appetite change, fatigue and fever.  HENT: Negative  for hearing loss, mouth sores, trouble swallowing and voice change.   Eyes: Negative for redness and visual disturbance.  Respiratory: Negative for cough, shortness of breath and wheezing.   Cardiovascular: Negative for chest pain and leg swelling.  Gastrointestinal: Negative for abdominal pain, nausea and vomiting.       No changes in bowel habits.  Endocrine: Negative for cold intolerance, heat intolerance, polydipsia, polyphagia and polyuria.  Genitourinary: Negative for decreased urine volume, dysuria, hematuria, vaginal bleeding and vaginal discharge.  Musculoskeletal: Positive for arthralgias and neck pain. Negative for gait problem and myalgias.  Skin: Negative for color change and rash.  Neurological:  Negative for syncope, weakness and headaches.  Psychiatric/Behavioral: Negative for confusion and sleep disturbance. The patient is not nervous/anxious.   All other systems reviewed and are negative.   Current Outpatient Medications on File Prior to Visit  Medication Sig Dispense Refill  . amLODipine (NORVASC) 5 MG tablet Take 1 tablet (5 mg total) by mouth daily. 90 tablet 3  . calcium carbonate (CALTRATE 600) 1500 (600 Ca) MG TABS tablet Take 600 mg of elemental calcium by mouth 2 (two) times daily with a meal.    . cholecalciferol (VITAMIN D) 1000 units tablet Take 1,000 Units by mouth daily.    Marland Kitchen latanoprost (XALATAN) 0.005 % ophthalmic solution Place 1 drop into both eyes at bedtime.    Marland Kitchen OVER THE COUNTER MEDICATION Take 2 capsules by mouth 3 (three) times daily. Ligaplex II 5300 - herbal supplement    . pravastatin (PRAVACHOL) 20 MG tablet TAKE 1 TABLET BY MOUTH EVERY DAY 90 tablet 3  . valsartan-hydrochlorothiazide (DIOVAN-HCT) 160-12.5 MG tablet TAKE 1 TABLET BY MOUTH EVERY DAY 90 tablet 2   No current facility-administered medications on file prior to visit.      Past Medical History:  Diagnosis Date  . Anxiety    no meds  . Dyslipidemia 05/08/2013  . Essential hypertension 05/08/2013  . Palpitations 05/08/2013   Hx   . SVD (spontaneous vaginal delivery)    x 1    Past Surgical History:  Procedure Laterality Date  . CARDIAC CATHETERIZATION  04/25/2001   EF>60%  . COLONOSCOPY    . DOPPLER ECHOCARDIOGRAPHY  02/13/2009   EF>55%  . HYSTEROSCOPY N/A 07/22/2015   Procedure: HYSTEROSCOPY;  Surgeon: Nichole Hipp, MD;  Location: [facility];  Service: Gynecology;  Laterality: N/A;  . NM MYOVIEW LTD  02/13/2009   EF 70%  Low risk scan  . NM PERSANTINE MYOVIEW LTD  December 2010   EF> 70%. No ischemia or infarction  . TRANSTHORACIC ECHOCARDIOGRAM  December 2010   Normal LV size and function. Mild MR and TR. Otherwise normal. Normal diastolic function.  . WISDOM TOOTH EXTRACTION       Allergies  Allergen Reactions  . Celexa [Citalopram] Swelling    Mouth swells    Family History  Problem Relation Age of Onset  . Colon cancer Sister   . Colon cancer Brother   . Colon cancer Cousin     Social History   Socioeconomic History  . Marital status: Single    Spouse name: Not on file  . Number of children: Not on file  . Years of education: Not on file  . Highest education level: Not on file  Occupational History  . Not on file  Social Needs  . Financial resource strain: Not on file  . Food insecurity    Worry: Not on file    Inability: Not on file  .  Transportation needs    Medical: Not on file    Non-medical: Not on file  Tobacco Use  . Smoking status: Never Smoker  . Smokeless tobacco: Never Used  Substance and Sexual Activity  . Alcohol use: No  . Drug use: No  . Sexual activity: Not Currently    Birth control/protection: Post-menopausal  Lifestyle  . Physical activity    Days per week: Not on file    Minutes per session: Not on file  . Stress: Not on file  Relationships  . Social Herbalist on phone: Not on file    Gets together: Not on file    Attends religious service: Not on file    Active member of club or organization: Not on file    Attends meetings of clubs or organizations: Not on file    Relationship status: Not on file  Other Topics Concern  . Not on file  Social History Narrative   Single mother of one, grandmother 25, great-grandmother 2.   Does not smoke, does not drink.   Exercises usually on treadmill mostly 3 days a week.     Vitals:   12/28/18 1206  BP: 124/64  Pulse: 78  Resp: 16  Temp: (!) 97.4 F (36.3 C)  SpO2: 96%   Body mass index is 27.69 kg/m.   Wt Readings from Last 3 Encounters:  12/28/18 141 lb 12.8 oz (64.3 kg)  09/15/18 139 lb (63 kg)  08/05/18 131 lb 9.6 oz (59.7 kg)    Physical Exam  Nursing note and vitals reviewed. Constitutional: She is oriented to person, place, and time.  She appears well-developed. No distress.  HENT:  Head: Normocephalic and atraumatic.  Right Ear: Hearing, tympanic membrane, external ear and ear canal normal.  Left Ear: Hearing, tympanic membrane, external ear and ear canal normal.  Mouth/Throat: Uvula is midline, oropharynx is clear and moist and mucous membranes are normal.  Eyes: Pupils are equal, round, and reactive to light. Conjunctivae and EOM are normal.  Neck: No tracheal deviation present. No thyromegaly present.  Cardiovascular: Normal rate and regular rhythm.  No murmur heard. Pulses:      Dorsalis pedis pulses are 2+ on the right side and 2+ on the left side.  Respiratory: Effort normal and breath sounds normal. No respiratory distress.  GI: Soft. She exhibits no mass. There is no hepatomegaly. There is no abdominal tenderness.  Genitourinary:    Genitourinary Comments: Deferred to gyn.   Musculoskeletal:        General: No edema.     Comments: No signs of synovitis appreciated.  Lymphadenopathy:    She has no cervical adenopathy.  Neurological: She is alert and oriented to person, place, and time. She has normal strength. No cranial nerve deficit. Coordination and gait normal.  Reflex Scores:      Bicep reflexes are 2+ on the right side and 2+ on the left side.      Patellar reflexes are 2+ on the right side and 2+ on the left side. Skin: Skin is warm. No rash noted. No erythema.  Psychiatric: She has a normal mood and affect.  Well groomed, good eye contact.    ASSESSMENT AND PLAN:  [name] was here today annual physical examination.  Orders Placed This Encounter  Procedures  . DEXAScan  . Pneumococcal polysaccharide vaccine 23-valent greater than or equal to 2yo subcutaneous/IM  . Comprehensive metabolic panel  . Lipid panel  . VITAMIN D  25 Hydroxy (Vit-D Deficiency, Fractures)   Lab Results  Component Value Date   CHOL 175 12/28/2018   HDL 53.10 12/28/2018   LDLCALC 105 (H) 12/28/2018    TRIG 85.0 12/28/2018   CHOLHDL 3 12/28/2018   Lab Results  Component Value Date   CREATININE 0.67 12/28/2018   BUN 10 12/28/2018   NA 140 12/28/2018   K 3.8 12/28/2018   CL 103 12/28/2018   CO2 31 12/28/2018   Lab Results  Component Value Date   ALT 16 12/28/2018   AST 22 12/28/2018   ALKPHOS 60 12/28/2018   BILITOT 0.6 12/28/2018    Routine general medical examination at a health care facility We discussed the importance of regular physical activity and healthy diet for prevention of chronic illness and/or complications. Preventive guidelines reviewed. Vaccination: Pneumovax given today.  Ca++ and vit D supplementation to continue. Next CPE in a year.  Essential hypertension Adequately controlled. No changes in current management. Continue low salt diet.  -     Comprehensive metabolic panel  Dyslipidemia Continue Pravastatin 20 mg daily. Will adjust treatment if needed based on FLP results.  -     Comprehensive metabolic panel -     Lipid panel  Vitamin D deficiency, unspecified No changes in current management, will follow labs done today and will give further recommendations accordingly.  -     VITAMIN D 25 Hydroxy (Vit-D Deficiency, Fractures)  Elevated transaminase measurement Mild. Asymptomatic. Hep A and B vaccined up to date.  Medicare annual wellness visit, initial We discussed the importance of staying active, physically and mentally, as well as the benefits of a healthy/balance diet. Low impact exercise that involve stretching and strengthing are ideal. Vaccines: Refused influenza and shingrix vaccine. We discussed preventive screening for the next 5-10 years, summery of recommendations given in AVS. Fall prevention. DEXA will be arranged. Colonoscopy in 2027.  Advance directives and end of life discussed, she doe snot have POA or living will. Advance directive package given.   Asymptomatic postmenopausal estrogen deficiency -     DEXAScan;  Future  Need for pneumococcal vaccination -     Pneumococcal polysaccharide vaccine 23-valent greater than or equal to 2yo subcutaneous/IM   Return in 6 months (on 06/28/2019).    Jessa Stinson G. [place], MD  [facility]. [place] office.

## 2018-12-28 NOTE — Patient Instructions (Addendum)
  Nichole Walters , Thank you for taking time to come for your Medicare Wellness Visit. I appreciate your ongoing commitment to your health goals. Please review the following plan we discussed and let me know if I can assist you in the future.   These are the goals we discussed: Goals   None     This is a list of the screening recommended for you and due dates:  Health Maintenance  Topic Date Due  . DEXA scan (bone density measurement)  09/02/2017  . Pneumonia vaccines (2 of 2 - PPSV23) 09/07/2018  . Flu Shot  10/01/2018  . Mammogram  05/08/2020  . Colon Cancer Screening  07/31/2025  . Tetanus Vaccine  07/18/2026  .  Hepatitis C: One time screening is recommended by Center for Disease Control  (CDC) for  adults born from [birth year] through 1963.   Completed    Routine general medical examination at a health care facility  Essential hypertension - Plan: Comprehensive metabolic panel  Dyslipidemia - Plan: Comprehensive metabolic panel, Lipid panel  Vitamin D deficiency, unspecified - Plan: VITAMIN D 25 Hydroxy (Vit-D Deficiency, Fractures)  Elevated transaminase measurement  Medicare annual wellness visit, initial  Asymptomatic postmenopausal estrogen deficiency - Plan: DEXAScan   A few tips:  -As we age balance is not as good as it was, so there is a higher risks for falls. Please remove small rugs and furniture that is "in your way" and could increase the risk of falls. Stretching exercises may help with fall prevention: Yoga and Tai Chi are some examples. Low impact exercise is better, so you are not very achy the next day.  -Sun screen and avoidance of direct sun light recommended. Caution with dehydration, if working outdoors be sure to drink enough fluids.  - Some medications are not safe as we age, increases the risk of side effects and can potentially interact with other medication you are also taken;  including some of over the counter medications. Be sure to let me know when you  start a new medication even if it is a dietary/vitamin supplement.   -Healthy diet low in red meet/animal fat and sugar + regular physical activity is recommended.       Screening schedule for the next 5-10 years:  Colonoscopy Glaucoma screening/eye exam every 1-2 years. Mammogram for breast cancer screening annually. Flu vaccine annually. Diabetes screening  Fall prevention   Advance directives:  Please see a lawyer and/or go to this website to help you with advanced directives and designating a health care power of attorney so that your wishes will be followed should you become too ill to make your own medical decisions.  [email or web address]

## 2018-12-29 LAB — VITAMIN D 25 HYDROXY (VIT D DEFICIENCY, FRACTURES): VITD: 35.38 ng/mL (ref 30.00–100.00)

## 2019-01-16 ENCOUNTER — Telehealth: Payer: Self-pay | Admitting: *Deleted

## 2019-01-16 NOTE — Telephone Encounter (Signed)
Copied from [place] [phone] [phone]. Topic: Medicare AWV >> Jan 16, 2019  4:37 PM Percell Belt A wrote: Reason for CRM:  Ins compy called in and stated that it was time for pt to sch a AWV.  JUST fyi

## 2019-01-17 NOTE — Telephone Encounter (Signed)
Noted  

## 2019-02-14 ENCOUNTER — Other Ambulatory Visit: Payer: Self-pay

## 2019-02-14 ENCOUNTER — Ambulatory Visit
Admission: RE | Admit: 2019-02-14 | Discharge: 2019-02-14 | Disposition: A | Discharge status: Home / Self Care | Payer: Medicare Other | Source: Ambulatory Visit | Attending: Family Medicine | Admitting: Family Medicine

## 2019-02-14 DIAGNOSIS — Z780 Asymptomatic menopausal state: Secondary | ICD-10-CM | POA: Diagnosis not present

## 2019-02-14 DIAGNOSIS — H401132 Primary open-angle glaucoma, bilateral, moderate stage: Secondary | ICD-10-CM | POA: Diagnosis not present

## 2019-02-14 DIAGNOSIS — M8589 Other specified disorders of bone density and structure, multiple sites: Secondary | ICD-10-CM | POA: Diagnosis not present

## 2019-04-11 DIAGNOSIS — H401132 Primary open-angle glaucoma, bilateral, moderate stage: Secondary | ICD-10-CM | POA: Diagnosis not present

## 2019-05-10 ENCOUNTER — Telehealth: Payer: Self-pay | Admitting: *Deleted

## 2019-05-10 ENCOUNTER — Other Ambulatory Visit: Payer: Self-pay | Admitting: Gynecologic Oncology

## 2019-05-10 DIAGNOSIS — N83201 Unspecified ovarian cyst, right side: Secondary | ICD-10-CM

## 2019-05-10 NOTE — Telephone Encounter (Signed)
Patient called and scheduled a follow appt to proceed with surgery. Per Lenna Sciara APP patient to have [place] scan before the MD visit. Scheduled the [place] and called the patient with appt dates/times and instructions

## 2019-05-17 ENCOUNTER — Other Ambulatory Visit: Payer: Self-pay

## 2019-05-17 ENCOUNTER — Ambulatory Visit (HOSPITAL_COMMUNITY)
Admission: RE | Admit: 2019-05-17 | Discharge: 2019-05-17 | Disposition: A | Discharge status: Home / Self Care | Payer: Medicare Other | Source: Ambulatory Visit | Attending: Gynecologic Oncology | Admitting: Gynecologic Oncology

## 2019-05-17 DIAGNOSIS — N83202 Unspecified ovarian cyst, left side: Secondary | ICD-10-CM | POA: Diagnosis present

## 2019-05-17 DIAGNOSIS — N83201 Unspecified ovarian cyst, right side: Secondary | ICD-10-CM | POA: Insufficient documentation

## 2019-06-15 NOTE — Progress Notes (Signed)
Follow-up Note: Gyn-Onc  Consult was initially requested by Dr. Stann Mainland for the evaluation of Nichole Walters 69 y.o. female  CC:  Chief Complaint  Patient presents with  . Bilateral ovarian cysts    Follow up    Assessment/Plan:  [name]  is a 69 y.o.  year old with bilateral ovarian cysts and uterine fibroids.  I reviewed her [place] images from March, 2021.  Given that things are stable, we continue to have 3 options to proceed:  1/ close follow-up with surveillance [place] in 12 months. 2/ proceeding with surgical evaluation with robotic BSO to gain pathology information 3/ proceeding with surgery with hysterectomy/BSO for pathology and definitive management. I explained that this would be the most radical procedure with the most risks and recovery, although, she is a good surgical candidate overall with a favorable risk profile for surgery. I discussed common risks of surgery including  bleeding, infection, damage to internal organs (such as bladder,ureters, bowels), blood clot, reoperation and rehospitalization. I explained that this would be a same-day discharge outpatient procedure and discussed anticipated recovery time and restrictions.  She is somewhat interested in surgery (robotic hysterectomy with BSO and possible staging) but not yet (she has something important in June that she wants to be well for). She may contact our office when she is more sure of proceeding with surgery. If a year passes without surgery, she is recommended to return for ultrasound and visit with me.   HPI: [name] is a 69 year old P1 who is seen in consultation at the request of Dr Stann Mainland for a fibroid uterus and bilateral ovarian cysts.  The patient reports that she has been known to have fibroids for many years. She had an episode of vaginal bleeding in 2017 which was managed with a hysteroscopy D&C and polypectomy for benign pathology. She had no bleeding since that time.  She  underwent MRI spine in early 2020 for back pain. This detected ovarian cysts and she then underwent a TVUS on 05/12/18. This showed a uterus measuring 8.8x4.3x8cm with fibroids and an endometrial thickness of 47mm. A fundal fibroid measured 1.7cm and a right lateral fibroid measured 3.8cm. The right ovary contained a simple cyst measuring 4.8x3.1x3.1cm nd hte left ovary contained a simple cyst measuring 7.2x5x5.9cm.   An MRI was performed of the pelvis on 05/24/18 which showed am 8x4x6cm uterus with several uterine fibroids again noted including an exophytic fibroid projecting from the lower anterior segment on the left measuring 4.1cm. There was a 5cm simple cyst on the right and a 7.4cm left ovarian simple cyst. No ascites or peritoneal nodularity was seen.  Endometrial biopsy was performed on 5//18/20 and revealed simple hyperplasia without atypia. Pap was normal.   She is an otherwise a healthy woman with some hypertension and hypercholesterolemia.  She has never had abdominal surgery. She had one prior vaginal delivery.   CA 125 was drawn on 08/05/18 and was normal at 8.2. She was offered either hysterectomy with BSO, BSO alone, or expectant management and she elected for the latter.  Interval Hx :  On 05/17/19 she underwent repeat TVUS which showed a uterus with measurements: 8.4 x 4.7 x 5.1 cm and multiple small (<3cm) fibroids. The endometrial thickness was 12 mm. There was a small endometrial cyst 4 x 3 x 4 mm. No additional masses. Large simple cyst LEFT adnexa, 7.4 x 6.2 x 5.2cm. Additional cyst in RIGHT adnexa 5.3 x 3.2 x 3.7 cm,  with minimal dependent debris. No additional adnexal masses. These were shown to be likely arising from the ovaries on a prior MR and are grossly unchanged in size. Follow-up imaging recommended in 1 year to assess continued stability.  She has occasional pains in the pelvis but these are not bothersome. She has gained some weight.  She denies vaginal bleeding.   She is somewhat interested in surgery, but has something she needs to be well for in June, 2021.  Current Meds:  Outpatient Encounter Medications as of 06/16/2019  Medication Sig  . amLODipine (NORVASC) 5 MG tablet Take 1 tablet (5 mg total) by mouth daily.  . calcium carbonate (CALTRATE 600) 1500 (600 Ca) MG TABS tablet Take 600 mg of elemental calcium by mouth 2 (two) times daily with a meal.  . cholecalciferol (VITAMIN D) 1000 units tablet Take 1,000 Units by mouth daily.  Marland Kitchen latanoprost (XALATAN) 0.005 % ophthalmic solution Place 1 drop into both eyes at bedtime.  Marland Kitchen OVER THE COUNTER MEDICATION Take 2 capsules by mouth 3 (three) times daily. Ligaplex II 5300 - herbal supplement  . pravastatin (PRAVACHOL) 20 MG tablet TAKE 1 TABLET BY MOUTH EVERY DAY  . valsartan-hydrochlorothiazide (DIOVAN-HCT) 160-12.5 MG tablet TAKE 1 TABLET BY MOUTH EVERY DAY   No facility-administered encounter medications on file as of 06/16/2019.    Allergy:  Allergies  Allergen Reactions  . Celexa [Citalopram] Swelling    Mouth swells    Social Hx:   Social History   Socioeconomic History  . Marital status: Single    Spouse name: Not on file  . Number of children: Not on file  . Years of education: Not on file  . Highest education level: Not on file  Occupational History  . Not on file  Tobacco Use  . Smoking status: Never Smoker  . Smokeless tobacco: Never Used  Substance and Sexual Activity  . Alcohol use: No  . Drug use: No  . Sexual activity: Not Currently    Birth control/protection: Post-menopausal  Other Topics Concern  . Not on file  Social History Narrative   Single mother of one, grandmother 3, great-grandmother 2.   Does not smoke, does not drink.   Exercises usually on treadmill mostly 3 days a week.   Social Determinants of Health   Financial Resource Strain:   . Difficulty of Paying Living Expenses:   Food Insecurity:   . Worried About Charity fundraiser in the Last Year:    . Arboriculturist in the Last Year:   Transportation Needs:   . Film/video editor (Medical):   Marland Kitchen Lack of Transportation (Non-Medical):   Physical Activity:   . Days of Exercise per Week:   . Minutes of Exercise per Session:   Stress:   . Feeling of Stress :   Social Connections:   . Frequency of Communication with Friends and Family:   . Frequency of Social Gatherings with Friends and Family:   . Attends Religious Services:   . Active Member of Clubs or Organizations:   . Attends Archivist Meetings:   Marland Kitchen Marital Status:   Intimate Partner Violence:   . Fear of Current or Ex-Partner:   . Emotionally Abused:   Marland Kitchen Physically Abused:   . Sexually Abused:     Past Surgical Hx:  Past Surgical History:  Procedure Laterality Date  . CARDIAC CATHETERIZATION  04/25/2001   EF>60%  . COLONOSCOPY    . DOPPLER ECHOCARDIOGRAPHY  02/13/2009  EF>55%  . HYSTEROSCOPY N/A 07/22/2015   Procedure: HYSTEROSCOPY;  Surgeon: Alden Hipp, MD;  Location: [facility];  Service: Gynecology;  Laterality: N/A;  . NM MYOVIEW LTD  02/13/2009   EF 70%  Low risk scan  . NM PERSANTINE MYOVIEW LTD  December 2010   EF> 70%. No ischemia or infarction  . TRANSTHORACIC ECHOCARDIOGRAM  December 2010   Normal LV size and function. Mild MR and TR. Otherwise normal. Normal diastolic function.  . WISDOM TOOTH EXTRACTION      Past Medical Hx:  Past Medical History:  Diagnosis Date  . Anxiety    no meds  . Dyslipidemia 05/08/2013  . Essential hypertension 05/08/2013  . Palpitations 05/08/2013   Hx   . SVD (spontaneous vaginal delivery)    x 1    Past Gynecological History:  SVD x 1 No LMP recorded. Patient is postmenopausal.  Family Hx:  Family History  Problem Relation Age of Onset  . Colon cancer Sister   . Colon cancer Brother   . Colon cancer Cousin     Review of Systems:  Constitutional  Feels well,    ENT Normal appearing ears and nares bilaterally Skin/Breast  No rash, sores,  jaundice, itching, dryness Cardiovascular  No chest pain, shortness of breath, or edema  Pulmonary  No cough or wheeze.  Gastro Intestinal  No nausea, vomitting, or diarrhoea. No bright red blood per rectum, no abdominal pain, change in bowel movement, or constipation.  Genito Urinary  No frequency, urgency, dysuria, no bleeding Musculo Skeletal  No myalgia, arthralgia, joint swelling or pain  Neurologic  No weakness, numbness, change in gait,  Psychology  No depression, anxiety, insomnia.   Vitals:  Blood pressure 124/70, pulse 84, temperature 98 F (36.7 C), temperature source Temporal, resp. rate 17, height 5' (1.524 m), weight 146 lb 14.4 oz (66.6 kg), SpO2 99 %.  Physical Exam: WD in NAD Neck  Supple NROM, without any enlargements.  Lymph Node Survey No cervical supraclavicular or inguinal adenopathy Cardiovascular  Pulse normal rate, regularity and rhythm. S1 and S2 normal.  Lungs  Clear to auscultation bilateraly, without wheezes/crackles/rhonchi. Good air movement.  Skin  No rash/lesions/breakdown  Psychiatry  Alert and oriented to person, place, and time  Abdomen  Normoactive bowel sounds, abdomen soft, non-tender and nonobese without evidence of hernia.  Back No CVA tenderness Genito Urinary  Vulva/vagina: Normal external female genitalia.   No lesions. No discharge or bleeding.  Bladder/urethra:  No lesions or masses, well supported bladder  Vagina: normal  Cervix: Normal appearing, no lesions.  Uterus:  Bulky, mobile, no parametrial involvement or nodularity.  Adnexa: no discretely palpable masses. Rectal  Good tone, no masses no cul de sac nodularity.  Extremities  No bilateral cyanosis, clubbing or edema.   Thereasa Solo, MD  06/16/2019, 3:15 PM

## 2019-06-16 ENCOUNTER — Inpatient Hospital Stay: Payer: Medicare Other | Attending: Gynecologic Oncology | Admitting: Gynecologic Oncology

## 2019-06-16 ENCOUNTER — Encounter: Payer: Self-pay | Admitting: Gynecologic Oncology

## 2019-06-16 ENCOUNTER — Other Ambulatory Visit: Payer: Self-pay

## 2019-06-16 VITALS — BP 124/70 | HR 84 | Temp 98.0°F | Resp 17 | Ht 60.0 in | Wt 146.9 lb

## 2019-06-16 DIAGNOSIS — N8501 Benign endometrial hyperplasia: Secondary | ICD-10-CM | POA: Diagnosis not present

## 2019-06-16 DIAGNOSIS — Z90710 Acquired absence of both cervix and uterus: Secondary | ICD-10-CM | POA: Diagnosis not present

## 2019-06-16 DIAGNOSIS — I10 Essential (primary) hypertension: Secondary | ICD-10-CM | POA: Diagnosis not present

## 2019-06-16 DIAGNOSIS — Z79899 Other long term (current) drug therapy: Secondary | ICD-10-CM | POA: Insufficient documentation

## 2019-06-16 DIAGNOSIS — Z90722 Acquired absence of ovaries, bilateral: Secondary | ICD-10-CM | POA: Diagnosis not present

## 2019-06-16 DIAGNOSIS — N83201 Unspecified ovarian cyst, right side: Secondary | ICD-10-CM

## 2019-06-16 DIAGNOSIS — N83202 Unspecified ovarian cyst, left side: Secondary | ICD-10-CM

## 2019-06-16 DIAGNOSIS — E7800 Pure hypercholesterolemia, unspecified: Secondary | ICD-10-CM | POA: Insufficient documentation

## 2019-06-16 DIAGNOSIS — D259 Leiomyoma of uterus, unspecified: Secondary | ICD-10-CM | POA: Diagnosis present

## 2019-06-16 NOTE — Patient Instructions (Signed)
Dr Denman George is recommending that you consider hysterectomy with removal of both tubes and ovaries. It is reasonable for this to be delayed until later in the year when it is more convenient. This procedure would be performed through 3 or 4 small incisions in the abdomen. It is typically an outpatient procedure with discharge to home the same day after a 3-4 hour recovery process.   If you desire this surgery, please call Dr Denman George at [phone]. It takes approximately 3-4 weeks to schedule the surgery.  If you prefer to continue to observe the cysts, Dr Denman George recommends repeat ultrasound and visit with her in 1 year.

## 2019-07-10 DIAGNOSIS — Z1231 Encounter for screening mammogram for malignant neoplasm of breast: Secondary | ICD-10-CM | POA: Diagnosis not present

## 2019-07-12 ENCOUNTER — Other Ambulatory Visit: Payer: Self-pay | Admitting: Obstetrics & Gynecology

## 2019-07-12 DIAGNOSIS — R928 Other abnormal and inconclusive findings on diagnostic imaging of breast: Secondary | ICD-10-CM

## 2019-07-13 ENCOUNTER — Other Ambulatory Visit: Payer: Self-pay | Admitting: Cardiology

## 2019-07-20 ENCOUNTER — Ambulatory Visit: Payer: Medicare Other

## 2019-07-20 ENCOUNTER — Other Ambulatory Visit: Payer: Self-pay

## 2019-07-20 ENCOUNTER — Ambulatory Visit
Admission: RE | Admit: 2019-07-20 | Discharge: 2019-07-20 | Disposition: A | Discharge status: Home / Self Care | Payer: Medicare Other | Source: Ambulatory Visit | Attending: Obstetrics & Gynecology | Admitting: Obstetrics & Gynecology

## 2019-07-20 DIAGNOSIS — R928 Other abnormal and inconclusive findings on diagnostic imaging of breast: Secondary | ICD-10-CM

## 2019-08-05 ENCOUNTER — Other Ambulatory Visit: Payer: Self-pay | Admitting: Family Medicine

## 2019-08-07 ENCOUNTER — Telehealth: Payer: Self-pay | Admitting: Family Medicine

## 2019-08-07 NOTE — Telephone Encounter (Signed)
Patient has a [facility] doctor that comes to the house 0.66 blood flow in left leg.   Patient was told that someone will be in contact with her from our office to schedule her an ultrasound for her leg.  Please advise

## 2019-08-07 NOTE — Telephone Encounter (Signed)
Please advise 

## 2019-08-09 ENCOUNTER — Other Ambulatory Visit: Payer: Self-pay | Admitting: Family Medicine

## 2019-08-09 DIAGNOSIS — I739 Peripheral vascular disease, unspecified: Secondary | ICD-10-CM

## 2019-08-09 NOTE — Telephone Encounter (Signed)
Order for ABI placed to evaluate for artery disease. Thanks, BJ

## 2019-08-09 NOTE — Progress Notes (Signed)
ABI

## 2019-08-11 NOTE — Telephone Encounter (Signed)
Noted  

## 2019-08-30 ENCOUNTER — Other Ambulatory Visit: Payer: Self-pay | Admitting: Family Medicine

## 2019-08-31 ENCOUNTER — Ambulatory Visit (HOSPITAL_COMMUNITY)
Admission: RE | Admit: 2019-08-31 | Discharge: 2019-08-31 | Disposition: A | Discharge status: Home / Self Care | Payer: Medicare Other | Source: Ambulatory Visit | Attending: Cardiology | Admitting: Cardiology

## 2019-08-31 ENCOUNTER — Other Ambulatory Visit: Payer: Self-pay

## 2019-08-31 DIAGNOSIS — I739 Peripheral vascular disease, unspecified: Secondary | ICD-10-CM

## 2019-09-25 ENCOUNTER — Ambulatory Visit: Payer: Medicare Other | Admitting: Cardiology

## 2019-09-25 ENCOUNTER — Other Ambulatory Visit: Payer: Self-pay

## 2019-09-25 ENCOUNTER — Encounter: Payer: Self-pay | Admitting: Cardiology

## 2019-09-25 VITALS — BP 115/74 | HR 84 | Temp 94.3°F | Ht 60.0 in | Wt 146.4 lb

## 2019-09-25 DIAGNOSIS — R002 Palpitations: Secondary | ICD-10-CM | POA: Diagnosis not present

## 2019-09-25 DIAGNOSIS — E785 Hyperlipidemia, unspecified: Secondary | ICD-10-CM | POA: Diagnosis not present

## 2019-09-25 DIAGNOSIS — I10 Essential (primary) hypertension: Secondary | ICD-10-CM

## 2019-09-25 NOTE — Progress Notes (Signed)
Primary Care Provider: Martin[place], Betty G, MD Cardiologist: Glenetta Hew, MD Electrophysiologist: None  Clinic Note: Chief Complaint  Patient presents with  . Annual Exam    Very rare palpitations  . Palpitations    One episode about a month ago that lasted about an minute of fast heart rate or shortness of breath, otherwise minimal spells.    HPI:    Nichole Walters is a 69 y.o. female with a PMH below who presents today for annual f/u for palpitations...  Nichole Walters was last seen in July 2020 via telemedicine visits.  She is doing very well.  Walking most days and otherwise doing her stationary bike.  Was only a little bit upset that she could not continue doing Silver sneakers because of COVID-19 restrictions.  No active cardiac symptoms.  Recent Hospitalizations: None  Reviewed  CV studies:    The following studies were reviewed today: (if available, images/films reviewed: From Epic Chart or Care Everywhere) . ABI July 2021: Normal ABIs and normal left TBI with abnormal right TBI.   Inte July 2021:rval History:   Nichole Walters for annual follow-up doing fairly well.  She says that she "cheated a little bit" during 2020.  She was down at her brother and sister-in-law's house helping care for her brother prior to his death.  While doing that, she did not eat as well she usually would.  She put on some weight that she is now trying to lose back.  She really has not had much in the way of any significant palpitations, but she had not been exercising much as she had before.  Last year she was walking about an hour a day 3 days a week.  She was given to Silver sneakers, and unfortunately this was not the case during 2020-2021 because of COVID-19. She still is try to stay as active as she can now that she is back home.  No chest pain or pressure with rest or exertion.  She is no episodes of fluttering in her chest and sometimes she will feel her heart rate  going really fast for 10 to 30 seconds which will take her breath away for short period but most notable episode was last month when it lasted about a minute.  She feels a steady regular fast heart rhythm that is somewhat forceful, but then goes away as fast as it comes on.  Not associated with lightheadedness or dizziness, just a little bit of dyspnea.  CV Review of Symptoms (Summary) Cardiovascular ROS: no chest pain or dyspnea on exertion positive for - palpitations, rapid heart rate and Associated shortness of breath negative for - chest pain, dyspnea on exertion, edema, orthopnea, paroxysmal nocturnal dyspnea or Syncope/near syncope, TIA/amaurosis fugax, claudication  The patient does not have symptoms concerning for COVID-19 infection (fever, chills, cough, or new shortness of breath).  The patient is practicing social distancing & Masking.    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  While there, she did not eat as healthy as she usually eats at home.).  HENT: Negative for congestion and nosebleeds.   Respiratory: Negative for shortness of breath.   Gastrointestinal: Negative for abdominal pain, blood in stool and melena.  Genitourinary: Negative for hematuria.  Musculoskeletal: Negative for falls and joint pain.  Neurological: Negative  for dizziness, focal weakness and weakness.  Psychiatric/Behavioral: Negative.     I have reviewed and (if needed) personally updated the patient's problem list, medications, allergies, past medical and surgical history, social and family history.   PAST MEDICAL HISTORY   Past Medical History:  Diagnosis Date  . Anxiety    no meds  . Dyslipidemia 05/08/2013  . Essential hypertension 05/08/2013  . Palpitations 05/08/2013   Hx   . SVD (spontaneous vaginal delivery)    x 1    PAST  SURGICAL HISTORY   Past Surgical History:  Procedure Laterality Date  . CARDIAC CATHETERIZATION  04/25/2001   EF>60%  . COLONOSCOPY    . DOPPLER ECHOCARDIOGRAPHY  02/13/2009   EF>55%  . HYSTEROSCOPY N/A 07/22/2015   Procedure: HYSTEROSCOPY;  Surgeon: Alden Hipp, MD;  Location: [facility];  Service: Gynecology;  Laterality: N/A;  . NM MYOVIEW LTD  02/13/2009   EF 70%  Low risk scan  . NM PERSANTINE MYOVIEW LTD  December 2010   EF> 70%. No ischemia or infarction  . TRANSTHORACIC ECHOCARDIOGRAM  December 2010   Normal LV size and function. Mild MR and TR. Otherwise normal. Normal diastolic function.  . WISDOM TOOTH EXTRACTION      MEDICATIONS/ALLERGIES   Current Meds  Medication Sig  . amLODipine (NORVASC) 5 MG tablet Take 1 tablet (5 mg total) by mouth daily.  . calcium carbonate (CALTRATE 600) 1500 (600 Ca) MG TABS tablet Take 600 mg of elemental calcium by mouth 2 (two) times daily with a meal.  . cholecalciferol (VITAMIN D) 1000 units tablet Take 1,000 Units by mouth daily.  Marland Kitchen latanoprost (XALATAN) 0.005 % ophthalmic solution Place 1 drop into both eyes at bedtime.  Marland Kitchen OVER THE COUNTER MEDICATION Take 2 capsules by mouth 3 (three) times daily. Ligaplex II 5300 - herbal supplement  . pravastatin (PRAVACHOL) 20 MG tablet TAKE 1 TABLET BY MOUTH EVERY DAY  . valsartan-hydrochlorothiazide (DIOVAN-HCT) 160-12.5 MG tablet TAKE 1 TABLET BY MOUTH EVERY DAY    Allergies  Allergen Reactions  . Celexa [Citalopram] Swelling    Mouth swells    SOCIAL HISTORY/FAMILY HISTORY   Reviewed in Epic:  Pertinent findings: N/A  OBJCTIVE -PE, EKG, labs   Wt Readings from Last 3 Encounters:  09/25/19 146 lb 6.4 oz (66.4 kg)  06/16/19 146 lb 14.4 oz (66.6 kg)  12/28/18 141 lb 12.8 oz (64.3 kg)    Physical Exam: BP 115/74   Pulse 84   Temp (!) 94.3 F (34.6 C)   Ht 5' (1.524 m)   Wt 146 lb 6.4 oz (66.4 kg)   SpO2 96%   BMI 28.59 kg/m  Physical Exam Vitals reviewed.  Constitutional:       General: She is in acute distress.     Appearance: Normal appearance. She is normal weight. She is not ill-appearing.  Neck:     Vascular: No carotid bruit, hepatojugular reflux or JVD.  Cardiovascular:     Rate and Rhythm: Normal rate and regular rhythm.  No extrasystoles are present.    Chest Wall: PMI is not displaced.     Pulses: Normal pulses.     Heart sounds: Murmur heard. High-pitched blowing holosystolic murmur is present with a grade of 1/6 at the apex.  No friction rub. No gallop.   Pulmonary:     Effort: Pulmonary effort is normal. No respiratory distress.     Breath sounds: Normal breath sounds.  Abdominal:     General: Abdomen is flat.  Bowel sounds are normal.     Palpations: Abdomen is soft. There is no mass.     Comments: No HSM  Musculoskeletal:        General: No swelling. Normal range of motion.     Cervical back: Normal range of motion and neck supple.  Neurological:     General: No focal deficit present.     Mental Status: She is alert and oriented to person, place, and time.  Psychiatric:        Mood and Affect: Mood normal.        Behavior: Behavior normal.        Thought Content: Thought content normal.        Judgment: Judgment normal.     Adult ECG Report  Rate: 84 ;  Rhythm: normal sinus rhythm and Normal axis, intervals and durations.  Cannot exclude anterior MI, age undetermined ;   Narrative Interpretation: Stable EKG  Recent Labs: Just checked by PCP:  A1c 5.9; TSH 2.42, TC 181, TG 58, HDL 59 LDL 111 Lab Results  Component Value Date   CHOL 175 12/28/2018   HDL 53.10 12/28/2018   LDLCALC 105 (H) 12/28/2018   TRIG 85.0 12/28/2018   CHOLHDL 3 12/28/2018   Lab Results  Component Value Date   CREATININE 0.67 12/28/2018   BUN 10 12/28/2018   NA 140 12/28/2018   K 3.8 12/28/2018   CL 103 12/28/2018   CO2 31 12/28/2018   Lab Results  Component Value Date   TSH 2.21 07/16/2016    ASSESSMENT/PLAN    Problem List Items Addressed  This Visit    Essential hypertension (Chronic)    Blood pressure is well controlled on combination of dihydropyridine calcium channel blocker along with ARB-HCTZ.  Continue current meds.  No dizziness or wooziness      Dyslipidemia (Chronic)    Target LDL is about 100 and her current LDL is 111.  She is on pravastatin, may want to consider titrating up to 40 mg or converting to rosuvastatin.      Palpitations - Primary (Chronic)    She still doing but would very well without being on a beta-blocker.  Based on the fact that her symptoms have been so sparingly, I think were fine continue to hold beta-blocker.         COVID-19 Education: The signs and symptoms of COVID-19 were discussed with the patient and how to seek care for testing (follow up with PCP or arrange E-visit).   The importance of social distancing and COVID-19 vaccination was discussed today.  I spent a total of 15 minutes with the patient. >  50% of the time was spent in direct patient consultation.  Additional time spent with chart review  / charting (studies, outside notes, etc): 6 Total Time: 38min  Current medicines are reviewed at length with the patient today.  (+/- concerns) n/a  Notice: This dictation was prepared with Dragon dictation along with smaller phrase technology. Any transcriptional errors that result from this process are unintentional and may not be corrected upon review.  Patient Instructions / Medication Changes & Studies & Tests Ordered   Patient Instructions  Medication Instructions:  Continue current medications  *If you need a refill on your cardiac medications before your next appointment, please call your pharmacy*   Lab Work: None Ordered  Testing/Procedures: None ordered   Follow-Up: At [facility], you and your health needs are our priority.  As part of our continuing mission  to provide you with exceptional heart care, we have created designated Provider Care Teams.  These  Care Teams include your primary Cardiologist (physician) and Advanced Practice Providers (APPs -  Physician Assistants and Nurse Practitioners) who all work together to provide you with the care you need, when you need it.  We recommend signing up for the patient portal called "MyChart".  Sign up information is provided on this After Visit Summary.  MyChart is used to connect with patients for Virtual Visits (Telemedicine).  Patients are able to view lab/test results, encounter notes, upcoming appointments, etc.  Non-urgent messages can be sent to your provider as well.   To learn more about what you can do with MyChart, go to [email or web address].    Your next appointment:   1 year(s)  The format for your next appointment:   In Person  Provider:   You may see Glenetta Hew, MD or one of the following Advanced Practice Providers on your designated Care Team:    Rosaria Ferries, PA-C  Jory Sims, DNP, ANP  Cadence Kathlen Mody, PA-C        Studies Ordered:   No orders of the defined types were placed in this encounter.    Glenetta Hew, M.D., M.S. Interventional Cardiologist   Pager # [phone] Phone # [phone] [address] [place], [place] [address]   Thank you for choosing Heartcare at [facility]!!

## 2019-09-25 NOTE — Patient Instructions (Signed)
Medication Instructions:  Continue current medications  *If you need a refill on your cardiac medications before your next appointment, please call your pharmacy*   Lab Work: None Ordered   Testing/Procedures: None ordered   Follow-Up: At [facility], you and your health needs are our priority.  As part of our continuing mission to provide you with exceptional heart care, we have created designated Provider Care Teams.  These Care Teams include your primary Cardiologist (physician) and Advanced Practice Providers (APPs -  Physician Assistants and Nurse Practitioners) who all work together to provide you with the care you need, when you need it.  We recommend signing up for the patient portal called "MyChart".  Sign up information is provided on this After Visit Summary.  MyChart is used to connect with patients for Virtual Visits (Telemedicine).  Patients are able to view lab/test results, encounter notes, upcoming appointments, etc.  Non-urgent messages can be sent to your provider as well.   To learn more about what you can do with MyChart, go to [email or web address].    Your next appointment:   1 year(s)  The format for your next appointment:   In Person  Provider:   You may see David Harding, MD or one of the following Advanced Practice Providers on your designated Care Team:    Rhonda Barrett, PA-C  Kathryn Lawrence, DNP, ANP  Cadence Furth, PA-C  

## 2019-09-28 ENCOUNTER — Encounter: Payer: Self-pay | Admitting: Cardiology

## 2019-09-28 NOTE — Assessment & Plan Note (Signed)
Blood pressure is well controlled on combination of dihydropyridine calcium channel blocker along with ARB-HCTZ.  Continue current meds.  No dizziness or wooziness

## 2019-09-28 NOTE — Assessment & Plan Note (Signed)
Target LDL is about 100 and her current LDL is 111.  She is on pravastatin, may want to consider titrating up to 40 mg or converting to rosuvastatin.

## 2019-09-28 NOTE — Assessment & Plan Note (Signed)
She still doing but would very well without being on a beta-blocker.  Based on the fact that her symptoms have been so sparingly, I think were fine continue to hold beta-blocker.

## 2019-11-02 ENCOUNTER — Other Ambulatory Visit: Payer: Self-pay | Admitting: Cardiology

## 2019-12-29 ENCOUNTER — Ambulatory Visit (INDEPENDENT_AMBULATORY_CARE_PROVIDER_SITE_OTHER): Payer: Medicare Other | Admitting: Family Medicine

## 2019-12-29 ENCOUNTER — Other Ambulatory Visit: Payer: Self-pay

## 2019-12-29 ENCOUNTER — Encounter: Payer: Self-pay | Admitting: Family Medicine

## 2019-12-29 VITALS — BP 128/80 | HR 92 | Resp 16 | Ht 60.0 in | Wt 143.0 lb

## 2019-12-29 DIAGNOSIS — E559 Vitamin D deficiency, unspecified: Secondary | ICD-10-CM

## 2019-12-29 DIAGNOSIS — Z0000 Encounter for general adult medical examination without abnormal findings: Secondary | ICD-10-CM | POA: Diagnosis not present

## 2019-12-29 DIAGNOSIS — E785 Hyperlipidemia, unspecified: Secondary | ICD-10-CM

## 2019-12-29 DIAGNOSIS — I10 Essential (primary) hypertension: Secondary | ICD-10-CM | POA: Diagnosis not present

## 2019-12-29 NOTE — Patient Instructions (Addendum)
Today you have you routine preventive visit. A few things to remember from today's visit:  Routine general medical examination at a health care facility  Essential hypertension - Plan: COMPLETE METABOLIC PANEL WITH GFR  Dyslipidemia - Plan: Lipid panel  Elevated transaminase measurement  Vitamin D deficiency, unspecified - Plan: VITAMIN D 25 Hydroxy (Vit-D Deficiency, Fractures)  If you need refills please call your pharmacy. Do not use My Chart to request refills or for acute issues that need immediate attention.   Please be sure medication list is accurate. If a new problem present, please set up appointment sooner than planned today.  At least 150 minutes of moderate exercise per week, daily brisk walking for 15-30 min is a good exercise option. Healthy diet low in saturated (animal) fats and sweets and consisting of fresh fruits and vegetables, lean meats such as fish and white chicken and whole grains.  These are some of recommendations for screening depending of age and risk factors:  - Vaccines:  Tdap vaccine every 10 years.  Shingles vaccine recommended at age 58, could be given after 67 years of age but not sure about insurance coverage.   Pneumonia vaccines: Pneumovax at 55. Sometimes Pneumovax is giving earlier if history of smoking, lung disease,diabetes,kidney disease among some.  Screening for diabetes at age 33 and every 3 years.  Cervical cancer prevention:  Pap smear starts at 67 years of age and continues periodically until 67 years old in low risk women. Pap smear every 3 years between 11 and 29 years old. Pap smear every 3-5 years between women 69 and older if pap smear negative and HPV screening negative.   -Breast cancer: Mammogram: There is disagreement between experts about when to start screening in low risk asymptomatic female but recent recommendations are to start screening at 49 and not later than 67 years old , every 1-2 years and after 67 yo q 2  years. Screening is recommended until 67 years old but some women can continue screening depending of healthy issues.  Colon cancer screening: Has been recently changed to 67 yo. Insurance may not cover until you are 67 years old. Screening is recommended until 67 years old.  Cholesterol disorder screening at age 81 and every 3 years.N/A  Also recommended:  1. Dental visit- Brush and floss your teeth twice daily; visit your dentist twice a year. 2. Eye doctor- Get an eye exam at least every 2 years. 3. Helmet use- Always wear a helmet when riding a bicycle, motorcycle, rollerblading or skateboarding. 4. Safe sex- If you may be exposed to sexually transmitted infections, use a condom. 5. Seat belts- Seat belts can save your live; always wear one. 6. Smoke/Carbon Monoxide detectors- These detectors need to be installed on the appropriate level of your home. Replace batteries at least once a year. 7. Skin cancer- When out in the sun please cover up and use sunscreen 15 SPF or higher. 8. Violence- If anyone is threatening or hurting you, please tell your healthcare provider.  9. Drink alcohol in moderation- Limit alcohol intake to one drink or less per day. Never drink and drive. 10. Calcium supplementation 1000 to 1200 mg daily, ideally through your diet.  Vitamin D supplementation 800 units daily.

## 2019-12-29 NOTE — Progress Notes (Signed)
HPI: [name] is a 69 y.o. female, who is here today for her routine physical.  Last CPE: 12/28/18.  Regular exercise 3 or more time per week: Fit bit at least 7000 steps daily, walking 3-4 times per week.She also joined the gym, planning on starting yoga. Following a healthy diet: She has not been consistent, she eats "out a lt." She lives alone. Her brother died, so she is not longer traveling to [place].  Chronic medical problems: HTN,HLD,back pain, PAD,and vit D deficiency among some.  Follows with cardiologist.  Immunization History  Administered Date(s) Administered  . Hep A / Hep B 03/09/2017, 04/12/2017, 09/06/2017  . Pneumococcal Conjugate-13 09/06/2017  . Pneumococcal Polysaccharide-23 12/28/2018  . Tdap 07/17/2016   Mammogram: 07/20/19 She follows with gyn regularly. Colonoscopy: 08/01/2015 DEXA: 02/14/2019 FRAX score was 5.4 % and 1 % for osteoporotic fracture and hip fracture respectively.  Hep C screening: 02/22/17 NR  She has no concerns today.  HLD: She is on Pravastatin 20 mg daily. HTN: She is on Amlodipine 5 mg daily and valsartan-HCTZ 160-12.5 mg daily. Tolerating medications well.  Review of Systems  Constitutional: Negative for appetite change and fever.  HENT: Negative for dental problem, hearing loss, mouth sores and sore throat.   Eyes: Negative for redness and visual disturbance.  Respiratory: Negative for cough, shortness of breath and wheezing.   Cardiovascular: Negative for chest pain and leg swelling.  Gastrointestinal: Negative for abdominal pain, nausea and vomiting.       No changes in bowel habits.  Endocrine: Negative for cold intolerance, heat intolerance, polydipsia, polyphagia and polyuria.  Genitourinary: Negative for decreased urine volume, dysuria, hematuria, vaginal bleeding and vaginal discharge.  Musculoskeletal: Positive for arthralgias and back pain. Negative for gait problem.  Skin: Negative for color change  and rash.  Allergic/Immunologic: Negative for environmental allergies.  Neurological: Negative for syncope, weakness and headaches.  Hematological: Negative for adenopathy. Does not bruise/bleed easily.  Psychiatric/Behavioral: Positive for sleep disturbance. Negative for confusion.  All other systems reviewed and are negative.   Current Outpatient Medications on File Prior to Visit  Medication Sig Dispense Refill  . amLODipine (NORVASC) 5 MG tablet TAKE 1 TABLET BY MOUTH EVERY DAY 90 tablet 3  . calcium carbonate (CALTRATE 600) 1500 (600 Ca) MG TABS tablet Take 600 mg of elemental calcium by mouth 2 (two) times daily with a meal.    . cholecalciferol (VITAMIN D) 1000 units tablet Take 1,000 Units by mouth daily.    Marland Kitchen latanoprost (XALATAN) 0.005 % ophthalmic solution Place 1 drop into both eyes at bedtime.    Marland Kitchen OVER THE COUNTER MEDICATION Take 2 capsules by mouth 3 (three) times daily. Ligaplex II 5300 - herbal supplement    . pravastatin (PRAVACHOL) 20 MG tablet TAKE 1 TABLET BY MOUTH EVERY DAY 30 tablet 5  . valsartan-hydrochlorothiazide (DIOVAN-HCT) 160-12.5 MG tablet TAKE 1 TABLET BY MOUTH EVERY DAY 90 tablet 2   No current facility-administered medications on file prior to visit.   Past Medical History:  Diagnosis Date  . Anxiety    no meds  . Dyslipidemia 05/08/2013  . Essential hypertension 05/08/2013  . Palpitations 05/08/2013   Hx   . SVD (spontaneous vaginal delivery)    x 1    Past Surgical History:  Procedure Laterality Date  . CARDIAC CATHETERIZATION  04/25/2001   EF>60%  . COLONOSCOPY    . DOPPLER ECHOCARDIOGRAPHY  02/13/2009   EF>55%  . HYSTEROSCOPY N/A 07/22/2015  Procedure: HYSTEROSCOPY;  Surgeon: Alden Hipp, MD;  Location: [facility];  Service: Gynecology;  Laterality: N/A;  . NM MYOVIEW LTD  02/13/2009   EF 70%  Low risk scan  . NM PERSANTINE MYOVIEW LTD  December 2010   EF> 70%. No ischemia or infarction  . TRANSTHORACIC ECHOCARDIOGRAM  December 2010    Normal LV size and function. Mild MR and TR. Otherwise normal. Normal diastolic function.  . WISDOM TOOTH EXTRACTION      Allergies  Allergen Reactions  . Celexa [Citalopram] Swelling    Mouth swells    Family History  Problem Relation Age of Onset  . Colon cancer Sister   . Colon cancer Brother   . Colon cancer Cousin     Social History   Socioeconomic History  . Marital status: Single    Spouse name: Not on file  . Number of children: Not on file  . Years of education: Not on file  . Highest education level: Not on file  Occupational History  . Not on file  Tobacco Use  . Smoking status: Never Smoker  . Smokeless tobacco: Never Used  Vaping Use  . Vaping Use: Never used  Substance and Sexual Activity  . Alcohol use: No  . Drug use: No  . Sexual activity: Not Currently    Birth control/protection: Post-menopausal  Other Topics Concern  . Not on file  Social History Narrative   Single mother of one, grandmother 13, great-grandmother 2.   Does not smoke, does not drink.   Exercises usually on treadmill mostly 3 days a week.   Social Determinants of Health   Financial Resource Strain:   . Difficulty of Paying Living Expenses: Not on file  Food Insecurity:   . Worried About Charity fundraiser in the Last Year: Not on file  . Ran Out of Food in the Last Year: Not on file  Transportation Needs:   . Lack of Transportation (Medical): Not on file  . Lack of Transportation (Non-Medical): Not on file  Physical Activity:   . Days of Exercise per Week: Not on file  . Minutes of Exercise per Session: Not on file  Stress:   . Feeling of Stress : Not on file  Social Connections:   . Frequency of Communication with Friends and Family: Not on file  . Frequency of Social Gatherings with Friends and Family: Not on file  . Attends Religious Services: Not on file  . Active Member of Clubs or Organizations: Not on file  . Attends Archivist Meetings: Not on file    . Marital Status: Not on file   Vitals:   12/29/19 1354  BP: 128/80  Pulse: 92  Resp: 16  SpO2: 98%   Body mass index is 27.93 kg/m.  Wt Readings from Last 3 Encounters:  12/29/19 143 lb (64.9 kg)  09/25/19 146 lb 6.4 oz (66.4 kg)  06/16/19 146 lb 14.4 oz (66.6 kg)   Physical Exam Vitals and nursing note reviewed.  Constitutional:      General: She is not in acute distress.    Appearance: She is well-developed.  HENT:     Head: Normocephalic and atraumatic.     Right Ear: Hearing, tympanic membrane, ear canal and external ear normal.     Left Ear: Hearing, tympanic membrane, ear canal and external ear normal.     Mouth/Throat:     Mouth: Mucous membranes are moist.     Pharynx: Oropharynx is clear.  Uvula midline.  Eyes:     Extraocular Movements: Extraocular movements intact.     Conjunctiva/sclera: Conjunctivae normal.     Pupils: Pupils are equal, round, and reactive to light.  Neck:     Thyroid: No thyromegaly.     Trachea: No tracheal deviation.  Cardiovascular:     Rate and Rhythm: Normal rate and regular rhythm.     Pulses:          Dorsalis pedis pulses are 2+ on the right side and 2+ on the left side.     Heart sounds: No murmur heard.   Pulmonary:     Effort: Pulmonary effort is normal. No respiratory distress.     Breath sounds: Normal breath sounds.  Abdominal:     Palpations: Abdomen is soft. There is no hepatomegaly or mass.     Tenderness: There is no abdominal tenderness.  Genitourinary:    Comments: Deferred to gyn. Musculoskeletal:     Comments: No signs of synovitis appreciated.  Lymphadenopathy:     Cervical: No cervical adenopathy.  Skin:    General: Skin is warm.     Findings: No erythema or rash.  Neurological:     Mental Status: She is alert and oriented to person, place, and time.     Cranial Nerves: No cranial nerve deficit.     Coordination: Coordination normal.     Gait: Gait normal.     Deep Tendon Reflexes:     Reflex  Scores:      Bicep reflexes are 2+ on the right side and 2+ on the left side.      Patellar reflexes are 2+ on the right side and 2+ on the left side. Psychiatric:        Mood and Affect: Mood is not anxious or depressed.     Comments: Well groomed, good eye contact.   ASSESSMENT AND PLAN:  Ms. Zakiyah Diop was here today annual physical examination.  Orders Placed This Encounter  Procedures  . COMPLETE METABOLIC PANEL WITH GFR  . Lipid panel  . VITAMIN D 25 Hydroxy (Vit-D Deficiency, Fractures)   Lab Results  Component Value Date   CREATININE 0.65 12/29/2019   BUN 13 12/29/2019   NA 141 12/29/2019   K 4.2 12/29/2019   CL 104 12/29/2019   CO2 29 12/29/2019   Lab Results  Component Value Date   ALT 13 12/29/2019   AST 18 12/29/2019   ALKPHOS 60 12/28/2018   BILITOT 0.6 12/29/2019   Lab Results  Component Value Date   CHOL 182 12/29/2019   HDL 51 12/29/2019   LDLCALC 111 (H) 12/29/2019   TRIG 94 12/29/2019   CHOLHDL 3.6 12/29/2019   Routine general medical examination at a health care facility We discussed the importance of regular physical activity and healthy diet for prevention of chronic illness and/or complications. Preventive guidelines reviewed. Vaccination up to date. She does not take flu vaccine.  Ca++ and vit D supplementation recommended. Next CPE in a year.  The 10-year ASCVD risk score Mikey Bussing DC Brooke Bonito., et al., 2013) is: 9.5%   Values used to calculate the score:     Age: 52 years     Sex: Female     Is Non-Hispanic African American: Yes     Diabetic: No     Tobacco smoker: No     Systolic Blood Pressure: 098 mmHg     Is BP treated: Yes     HDL Cholesterol: 51 mg/dL  Total Cholesterol: 182 mg/dL  Essential hypertension BP adequately controlled. No changes in current management.  Dyslipidemia Continue Pravastatin 20 mg daily.  Vitamin D deficiency, unspecified Continue Vit D 1000 U daily, will adjust dose according to  HgA1C.  Return in about 6 months (around 06/28/2020) for HTN.   Louvina Cleary G. Martinique, MD  [facility]. [place] office.   Today you have you routine preventive visit. A few things to remember from today's visit:  Routine general medical examination at a health care facility  Essential hypertension - Plan: COMPLETE METABOLIC PANEL WITH GFR  Dyslipidemia - Plan: Lipid panel  Elevated transaminase measurement  Vitamin D deficiency, unspecified - Plan: VITAMIN D 25 Hydroxy (Vit-D Deficiency, Fractures)  If you need refills please call your pharmacy. Do not use My Chart to request refills or for acute issues that need immediate attention.   Please be sure medication list is accurate. If a new problem present, please set up appointment sooner than planned today.  At least 150 minutes of moderate exercise per week, daily brisk walking for 15-30 min is a good exercise option. Healthy diet low in saturated (animal) fats and sweets and consisting of fresh fruits and vegetables, lean meats such as fish and white chicken and whole grains.  These are some of recommendations for screening depending of age and risk factors:  - Vaccines:  Tdap vaccine every 10 years.  Shingles vaccine recommended at age 67, could be given after 67 years of age but not sure about insurance coverage.   Pneumonia vaccines: Pneumovax at 44. Sometimes Pneumovax is giving earlier if history of smoking, lung disease,diabetes,kidney disease among some.  Screening for diabetes at age 60 and every 3 years.  Cervical cancer prevention:  Pap smear starts at 67 years of age and continues periodically until 67 years old in low risk women. Pap smear every 3 years between 35 and 29 years old. Pap smear every 3-5 years between women 19 and older if pap smear negative and HPV screening negative.   -Breast cancer: Mammogram: There is disagreement between experts about when to start screening in low risk  asymptomatic female but recent recommendations are to start screening at 28 and not later than 67 years old , every 1-2 years and after 67 yo q 2 years. Screening is recommended until 67 years old but some women can continue screening depending of healthy issues.  Colon cancer screening: Has been recently changed to 67 yo. Insurance may not cover until you are 67 years old. Screening is recommended until 67 years old.  Cholesterol disorder screening at age 49 and every 3 years.N/A  Also recommended:  1. Dental visit- Brush and floss your teeth twice daily; visit your dentist twice a year. 2. Eye doctor- Get an eye exam at least every 2 years. 3. Helmet use- Always wear a helmet when riding a bicycle, motorcycle, rollerblading or skateboarding. 4. Safe sex- If you may be exposed to sexually transmitted infections, use a condom. 5. Seat belts- Seat belts can save your live; always wear one. 6. Smoke/Carbon Monoxide detectors- These detectors need to be installed on the appropriate level of your home. Replace batteries at least once a year. 7. Skin cancer- When out in the sun please cover up and use sunscreen 15 SPF or higher. 8. Violence- If anyone is threatening or hurting you, please tell your healthcare provider.  9. Drink alcohol in moderation- Limit alcohol intake to one drink or less per day. Never drink and  drive. 10. Calcium supplementation 1000 to 1200 mg daily, ideally through your diet.  Vitamin D supplementation 800 units daily.

## 2019-12-30 LAB — COMPLETE METABOLIC PANEL WITH GFR
AG Ratio: 1.2 (calc) (ref 1.0–2.5)
ALT: 13 U/L (ref 6–29)
AST: 18 U/L (ref 10–35)
Albumin: 4.2 g/dL (ref 3.6–5.1)
Alkaline phosphatase (APISO): 56 U/L (ref 37–153)
BUN: 13 mg/dL (ref 7–25)
CO2: 29 mmol/L (ref 20–32)
Calcium: 9.5 mg/dL (ref 8.6–10.4)
Chloride: 104 mmol/L (ref 98–110)
Creat: 0.65 mg/dL (ref 0.50–0.99)
GFR, Est African American: 106 mL/min/{1.73_m2} (ref >=60)
GFR, Est Non African American: 92 mL/min/{1.73_m2} (ref >=60)
Globulin: 3.5 g/dL (calc) (ref 1.9–3.7)
Glucose, Bld: 77 mg/dL (ref 65–99)
Potassium: 4.2 mmol/L (ref 3.5–5.3)
Sodium: 141 mmol/L (ref 135–146)
Total Bilirubin: 0.6 mg/dL (ref 0.2–1.2)
Total Protein: 7.7 g/dL (ref 6.1–8.1)

## 2019-12-30 LAB — LIPID PANEL
Cholesterol: 182 mg/dL (ref <200)
HDL: 51 mg/dL (ref >=50)
LDL Cholesterol (Calc): 111 mg/dL (calc) — ABNORMAL HIGH
Non-HDL Cholesterol (Calc): 131 mg/dL (calc) — ABNORMAL HIGH (ref <130)
Total CHOL/HDL Ratio: 3.6 (calc) (ref <5.0)
Triglycerides: 94 mg/dL (ref <150)

## 2019-12-30 LAB — VITAMIN D 25 HYDROXY (VIT D DEFICIENCY, FRACTURES): Vit D, 25-Hydroxy: 36 ng/mL (ref 30–100)

## 2020-02-06 ENCOUNTER — Ambulatory Visit: Payer: Medicare Other

## 2020-02-19 DIAGNOSIS — H401132 Primary open-angle glaucoma, bilateral, moderate stage: Secondary | ICD-10-CM | POA: Diagnosis not present

## 2020-03-01 ENCOUNTER — Other Ambulatory Visit: Payer: Self-pay | Admitting: Family Medicine

## 2020-04-19 DIAGNOSIS — H401132 Primary open-angle glaucoma, bilateral, moderate stage: Secondary | ICD-10-CM | POA: Diagnosis not present

## 2020-05-07 ENCOUNTER — Telehealth: Payer: Self-pay | Admitting: Cardiology

## 2020-05-07 ENCOUNTER — Other Ambulatory Visit: Payer: Self-pay

## 2020-05-07 MED ORDER — VALSARTAN-HYDROCHLOROTHIAZIDE 160-12.5 MG PO TABS
1.0000 | ORAL_TABLET | Freq: Every day | ORAL | 2 refills | Status: DC
Start: 2020-05-07 — End: 2020-12-19

## 2020-05-07 NOTE — Telephone Encounter (Signed)
*  STAT* If patient is at the pharmacy, call can be transferred to refill team.   1. Which medications need to be refilled? (please list name of each medication and dose if known)  valsartan-hydrochlorothiazide (DIOVAN-HCT) 160-12.5 MG tablet  2. Which pharmacy/location (including street and city if local pharmacy) is medication to be sent to? [id number] - [place], [place] - [address]OSS [address]  3. Do they need a 30 day or 90 day supply? 90 day   Patient has been out for 2 days

## 2020-06-27 DIAGNOSIS — Z008 Encounter for other general examination: Secondary | ICD-10-CM | POA: Diagnosis not present

## 2020-06-27 DIAGNOSIS — Z0000 Encounter for general adult medical examination without abnormal findings: Secondary | ICD-10-CM | POA: Diagnosis not present

## 2020-06-27 DIAGNOSIS — I341 Nonrheumatic mitral (valve) prolapse: Secondary | ICD-10-CM | POA: Diagnosis not present

## 2020-06-27 DIAGNOSIS — E785 Hyperlipidemia, unspecified: Secondary | ICD-10-CM | POA: Diagnosis not present

## 2020-06-27 DIAGNOSIS — I10 Essential (primary) hypertension: Secondary | ICD-10-CM | POA: Diagnosis not present

## 2020-07-01 DIAGNOSIS — U071 COVID-19: Secondary | ICD-10-CM | POA: Diagnosis not present

## 2020-07-03 ENCOUNTER — Encounter: Payer: Self-pay | Admitting: Family Medicine

## 2020-07-03 ENCOUNTER — Telehealth (INDEPENDENT_AMBULATORY_CARE_PROVIDER_SITE_OTHER): Payer: Medicare Other | Admitting: Family Medicine

## 2020-07-03 ENCOUNTER — Telehealth: Payer: Self-pay | Admitting: Family Medicine

## 2020-07-03 DIAGNOSIS — U071 COVID-19: Secondary | ICD-10-CM

## 2020-07-03 NOTE — Telephone Encounter (Signed)
Patient is calling and stated that she just tested positive for covid and is experiencing headache, chills, bodyache, cough and runny nose. Patient wanted to see if provider could give her any recommendations on what to take or if something can be prescribed, please advise. CB is [phone]

## 2020-07-03 NOTE — Progress Notes (Signed)
Virtual Visit via Video Note  I connected with Nichole Walters on 07/03/20 at  4:00 PM EDT by a video enabled telemedicine application 2/2 NIOEV-03 pandemic and verified that I am speaking with the correct person using two identifiers.  Location patient: home Location provider:work or home office Persons participating in the virtual visit: patient, provider  I discussed the limitations of evaluation and management by telemedicine and the availability of in person appointments. The patient expressed understanding and agreed to proceed.   HPI: Pt is a 70 yo female with pmh sig for HTN, h/o anxiety, HLD, palpitations followed by Betty Martinique MD who was seen for acute concern.  with rhinorrhea, fatigue, and cough on Saturday 4/30.  Pt tested positive for COVID on 5/2 with a rapid test.  She had an additional test on that day that came back positive on 5/3.  Pt feels better laying down.  Notes "heavy breathing" and tachycardia HR 112.  Pt also with dizziness.  States has been drinking less fluids as she has is sleeping.  Denies sore throat, ear pain/pressure, n/v, diarrhea, loss of taste or smell.  Pt has not tried anything for her symptoms.  Pt's husband recently tested positive.  Pt feels like he exposed her to [place] as he is still working.     ROS: See pertinent positives and negatives per HPI.  Past Medical History:  Diagnosis Date  . Anxiety    no meds  . Dyslipidemia 05/08/2013  . Essential hypertension 05/08/2013  . Palpitations 05/08/2013   Hx   . SVD (spontaneous vaginal delivery)    x 1    Past Surgical History:  Procedure Laterality Date  . CARDIAC CATHETERIZATION  04/25/2001   EF>60%  . COLONOSCOPY    . DOPPLER ECHOCARDIOGRAPHY  02/13/2009   EF>55%  . HYSTEROSCOPY N/A 07/22/2015   Procedure: HYSTEROSCOPY;  Surgeon: Alden Hipp, MD;  Location: [facility];  Service: Gynecology;  Laterality: N/A;  . NM MYOVIEW LTD  02/13/2009   EF 70%  Low risk scan  . NM PERSANTINE MYOVIEW LTD   December 2010   EF> 70%. No ischemia or infarction  . TRANSTHORACIC ECHOCARDIOGRAM  December 2010   Normal LV size and function. Mild MR and TR. Otherwise normal. Normal diastolic function.  . WISDOM TOOTH EXTRACTION      Family History  Problem Relation Age of Onset  . Colon cancer Sister   . Colon cancer Brother   . Colon cancer Cousin      Current Outpatient Medications:  .  amLODipine (NORVASC) 5 MG tablet, TAKE 1 TABLET BY MOUTH EVERY DAY, Disp: 90 tablet, Rfl: 3 .  calcium carbonate (CALTRATE 600) 1500 (600 Ca) MG TABS tablet, Take 600 mg of elemental calcium by mouth 2 (two) times daily with a meal., Disp: , Rfl:  .  cholecalciferol (VITAMIN D) 1000 units tablet, Take 1,000 Units by mouth daily., Disp: , Rfl:  .  latanoprost (XALATAN) 0.005 % ophthalmic solution, Place 1 drop into both eyes at bedtime., Disp: , Rfl:  .  OVER THE COUNTER MEDICATION, Take 2 capsules by mouth 3 (three) times daily. Ligaplex II 5300 - herbal supplement, Disp: , Rfl:  .  pravastatin (PRAVACHOL) 20 MG tablet, TAKE 1 TABLET BY MOUTH EVERY DAY, Disp: 90 tablet, Rfl: 1 .  valsartan-hydrochlorothiazide (DIOVAN-HCT) 160-12.5 MG tablet, Take 1 tablet by mouth daily., Disp: 90 tablet, Rfl: 2  EXAM:  VITALS per patient if applicable: RR between 50-09 bpm  GENERAL: alert, oriented, appears  well and in no acute distress  HEENT: atraumatic, conjunctiva clear, no obvious abnormalities on inspection of external nose and ears  NECK: normal movements of the head and neck  LUNGS: on inspection no signs of respiratory distress, breathing rate appears normal, no obvious gross SOB, gasping or wheezing  CV: no obvious cyanosis  MS: moves all visible extremities without noticeable abnormality  PSYCH/NEURO: pleasant and cooperative, no obvious depression or anxiety, speech and thought processing grossly intact  ASSESSMENT AND PLAN:  Discussed the following assessment and plan:  COVID-19 virus infection   -tested positive on 5/2 -discussed supportive care including OTC Tylenol, Coricidin, rest, hydration -Discussed rx for antiviral medication (molnupiravir), however patient declines at this time -Self quarantine advised -Given strict precautions for worsening symptoms  Follow-up as needed    I discussed the assessment and treatment plan with the patient. The patient was provided an opportunity to ask questions and all were answered. The patient agreed with the plan and demonstrated an understanding of the instructions.   The patient was advised to call back or seek an in-person evaluation if the symptoms worsen or if the condition fails to improve as anticipated.  Billie Ruddy, MD

## 2020-07-03 NOTE — Telephone Encounter (Signed)
Patient is scheduled for this afternoon.

## 2020-07-03 NOTE — Telephone Encounter (Signed)
Please offer virtual OV for pt.

## 2020-07-24 ENCOUNTER — Other Ambulatory Visit: Payer: Self-pay | Admitting: Obstetrics & Gynecology

## 2020-07-24 DIAGNOSIS — D259 Leiomyoma of uterus, unspecified: Secondary | ICD-10-CM

## 2020-07-31 DIAGNOSIS — Z1231 Encounter for screening mammogram for malignant neoplasm of breast: Secondary | ICD-10-CM | POA: Diagnosis not present

## 2020-08-05 ENCOUNTER — Other Ambulatory Visit: Payer: Self-pay | Admitting: Obstetrics & Gynecology

## 2020-08-05 DIAGNOSIS — Z1231 Encounter for screening mammogram for malignant neoplasm of breast: Secondary | ICD-10-CM

## 2020-08-19 ENCOUNTER — Other Ambulatory Visit: Payer: Medicare Other

## 2020-08-20 DIAGNOSIS — H401132 Primary open-angle glaucoma, bilateral, moderate stage: Secondary | ICD-10-CM | POA: Diagnosis not present

## 2020-08-26 ENCOUNTER — Other Ambulatory Visit: Payer: Self-pay | Admitting: Family Medicine

## 2020-08-30 ENCOUNTER — Ambulatory Visit
Admission: RE | Admit: 2020-08-30 | Discharge: 2020-08-30 | Disposition: A | Discharge status: Home / Self Care | Payer: Medicare Other | Source: Ambulatory Visit | Attending: Obstetrics & Gynecology | Admitting: Obstetrics & Gynecology

## 2020-08-30 DIAGNOSIS — D259 Leiomyoma of uterus, unspecified: Secondary | ICD-10-CM

## 2020-09-18 ENCOUNTER — Other Ambulatory Visit: Payer: Self-pay

## 2020-09-18 ENCOUNTER — Telehealth: Payer: Self-pay

## 2020-09-18 ENCOUNTER — Inpatient Hospital Stay: Payer: Medicare Other | Attending: Gynecologic Oncology | Admitting: Gynecologic Oncology

## 2020-09-18 ENCOUNTER — Inpatient Hospital Stay: Payer: Medicare Other | Admitting: Gynecologic Oncology

## 2020-09-18 ENCOUNTER — Ambulatory Visit (HOSPITAL_BASED_OUTPATIENT_CLINIC_OR_DEPARTMENT_OTHER): Payer: Medicare Other | Admitting: Gynecologic Oncology

## 2020-09-18 ENCOUNTER — Encounter: Payer: Self-pay | Admitting: Gynecologic Oncology

## 2020-09-18 VITALS — BP 128/71 | HR 69 | Temp 99.1°F | Resp 16 | Ht 60.0 in | Wt 144.7 lb

## 2020-09-18 DIAGNOSIS — N83201 Unspecified ovarian cyst, right side: Secondary | ICD-10-CM | POA: Diagnosis not present

## 2020-09-18 DIAGNOSIS — D259 Leiomyoma of uterus, unspecified: Secondary | ICD-10-CM | POA: Insufficient documentation

## 2020-09-18 DIAGNOSIS — R9389 Abnormal findings on diagnostic imaging of other specified body structures: Secondary | ICD-10-CM

## 2020-09-18 DIAGNOSIS — I10 Essential (primary) hypertension: Secondary | ICD-10-CM | POA: Diagnosis not present

## 2020-09-18 DIAGNOSIS — N83202 Unspecified ovarian cyst, left side: Secondary | ICD-10-CM | POA: Insufficient documentation

## 2020-09-18 DIAGNOSIS — E785 Hyperlipidemia, unspecified: Secondary | ICD-10-CM | POA: Insufficient documentation

## 2020-09-18 DIAGNOSIS — Z79899 Other long term (current) drug therapy: Secondary | ICD-10-CM | POA: Diagnosis not present

## 2020-09-18 MED ORDER — TRAMADOL HCL 50 MG PO TABS: 50.0000 mg | ORAL_TABLET | Freq: Four times a day (QID) | ORAL | 0 refills | Status: DC | PRN

## 2020-09-18 MED ORDER — SENNOSIDES-DOCUSATE SODIUM 8.6-50 MG PO TABS: 2.0000 | ORAL_TABLET | Freq: Every day | ORAL | 0 refills | Status: DC

## 2020-09-18 MED ORDER — IBUPROFEN 600 MG PO TABS: 600.0000 mg | ORAL_TABLET | Freq: Four times a day (QID) | ORAL | 0 refills | Status: DC | PRN

## 2020-09-18 NOTE — Progress Notes (Deleted)
Follow-up Note: Gyn-Onc  Consult was initially requested by Dr. Stann Mainland for the evaluation of Nichole Walters 70 y.o. female  CC:  No chief complaint on file.   Assessment/Plan:  [name]  is a 70 y.o.  year old with bilateral ovarian cysts and uterine fibroids.  I reviewed her [place] images from ***.   Given that *** I am recommending proceeding with surgery with hysterectomy/BSO for pathology and definitive diagnosis and management. IShe is a good surgical candidate overall with a favorable risk profile for surgery. I discussed common risks of surgery including  bleeding, infection, damage to internal organs (such as bladder,ureters, bowels), blood clot, reoperation and rehospitalization. I explained that this would be a same-day discharge outpatient procedure and discussed anticipated recovery time and restrictions.  *** She is somewhat interested in surgery.  HPI: [name] is a 70 year old P1 who was seen in consultation at the request of Dr Stann Mainland for a thickened endometrium (new finding) in the setting of fibroid uterus and bilateral ovarian cysts.  The patient reported that she has been known to have fibroids for many years. She had an episode of vaginal bleeding in 2017 which was managed with a hysteroscopy D&C and polypectomy for benign pathology. She had no bleeding since that time.  She underwent MRI spine in early 2020 for back pain. This detected ovarian cysts and she then underwent a TVUS on 05/12/18. This showed a uterus measuring 8.8x4.3x8cm with fibroids and an endometrial thickness of 42mm. A fundal fibroid measured 1.7cm and a right lateral fibroid measured 3.8cm. The right ovary contained a simple cyst measuring 4.8x3.1x3.1cm nd hte left ovary contained a simple cyst measuring 7.2x5x5.9cm.   An MRI was performed of the pelvis on 05/24/18 which showed am 8x4x6cm uterus with several uterine fibroids again noted including an exophytic fibroid projecting from  the lower anterior segment on the left measuring 4.1cm. There was a 5cm simple cyst on the right and a 7.4cm left ovarian simple cyst. No ascites or peritoneal nodularity was seen.  Endometrial biopsy was performed on 5//18/20 and revealed simple hyperplasia without atypia. Pap was normal.   CA 125 was drawn on 08/05/18 and was normal at 8.2. She was offered either hysterectomy with BSO, BSO alone, or expectant management and she elected for the latter.  On 05/17/19 she underwent repeat TVUS which showed a uterus with measurements: 8.4 x 4.7 x 5.1 cm and multiple small (<3cm) fibroids. The endometrial thickness was 12 mm. There was a small endometrial cyst 4 x 3 x 4 mm. No additional masses. Large simple cyst LEFT adnexa, 7.4 x 6.2 x 5.2cm. Additional cyst in RIGHT adnexa 5.3 x 3.2 x 3.7 cm, with minimal dependent debris. No additional adnexal masses. These were shown to be likely arising from the ovaries on a prior MR and are grossly unchanged in size. Follow-up imaging recommended in 1 year to assess continued stability.  At that time she denied vaginal bleeding.   She was offered robotic assisted total hysterectomy and BSO, possible staging when she was initially evaluated, and was somewhat interested in surgery, but desired delaying the surgery until after June, 2021.   Current Meds:  Outpatient Encounter Medications as of 09/18/2020  Medication Sig   amLODipine (NORVASC) 5 MG tablet TAKE 1 TABLET BY MOUTH EVERY DAY   calcium carbonate (CALTRATE 600) 1500 (600 Ca) MG TABS tablet Take 600 mg of elemental calcium by mouth 2 (two) times daily with a meal.  cholecalciferol (VITAMIN D) 1000 units tablet Take 1,000 Units by mouth daily.   latanoprost (XALATAN) 0.005 % ophthalmic solution Place 1 drop into both eyes at bedtime.   OVER THE COUNTER MEDICATION Take 2 capsules by mouth 3 (three) times daily. Ligaplex II 5300 - herbal supplement   pravastatin (PRAVACHOL) 20 MG tablet TAKE 1 TABLET BY  MOUTH EVERY DAY   valsartan-hydrochlorothiazide (DIOVAN-HCT) 160-12.5 MG tablet Take 1 tablet by mouth daily.   No facility-administered encounter medications on file as of 09/18/2020.    Allergy:  Allergies  Allergen Reactions   Celexa [Citalopram] Swelling    Mouth swells    Social Hx:   Social History   Socioeconomic History   Marital status: Single    Spouse name: Not on file   Number of children: Not on file   Years of education: Not on file   Highest education level: Not on file  Occupational History   Not on file  Tobacco Use   Smoking status: Never   Smokeless tobacco: Never  Vaping Use   Vaping Use: Never used  Substance and Sexual Activity   Alcohol use: No   Drug use: No   Sexual activity: Not Currently    Birth control/protection: Post-menopausal  Other Topics Concern   Not on file  Social History Narrative   Single mother of one, grandmother 37, great-grandmother 2.   Does not smoke, does not drink.   Exercises usually on treadmill mostly 3 days a week.   Social Determinants of Health   Financial Resource Strain: Not on file  Food Insecurity: Not on file  Transportation Needs: Not on file  Physical Activity: Not on file  Stress: Not on file  Social Connections: Not on file  Intimate Partner Violence: Not on file    Past Surgical Hx:  Past Surgical History:  Procedure Laterality Date   CARDIAC CATHETERIZATION  04/25/2001   EF>60%   COLONOSCOPY     DOPPLER ECHOCARDIOGRAPHY  02/13/2009   EF>55%   HYSTEROSCOPY N/A 07/22/2015   Procedure: HYSTEROSCOPY;  Surgeon: Alden Hipp, MD;  Location: [facility];  Service: Gynecology;  Laterality: N/A;   NM MYOVIEW LTD  02/13/2009   EF 70%  Low risk scan   NM PERSANTINE MYOVIEW LTD  December 2010   EF> 70%. No ischemia or infarction   TRANSTHORACIC ECHOCARDIOGRAM  December 2010   Normal LV size and function. Mild MR and TR. Otherwise normal. Normal diastolic function.   WISDOM TOOTH EXTRACTION      Past  Medical Hx:  Past Medical History:  Diagnosis Date   Anxiety    no meds   Dyslipidemia 05/08/2013   Essential hypertension 05/08/2013   Palpitations 05/08/2013   Hx    SVD (spontaneous vaginal delivery)    x 1    Past Gynecological History:  SVD x 1 No LMP recorded. Patient is postmenopausal.  Family Hx:  Family History  Problem Relation Age of Onset   Colon cancer Sister    Colon cancer Brother    Colon cancer Cousin     Review of Systems:  Constitutional  Feels well,    ENT Normal appearing ears and nares bilaterally Skin/Breast  No rash, sores, jaundice, itching, dryness Cardiovascular  No chest pain, shortness of breath, or edema  Pulmonary  No cough or wheeze.  Gastro Intestinal  No nausea, vomitting, or diarrhoea. No bright red blood per rectum, no abdominal pain, change in bowel movement, or constipation.  Genito Urinary  No  frequency, urgency, dysuria, no bleeding *** Musculo Skeletal  No myalgia, arthralgia, joint swelling or pain  Neurologic  No weakness, numbness, change in gait,  Psychology  No depression, anxiety, insomnia.   Vitals:  There were no vitals taken for this visit.  Physical Exam: WD in NAD Neck  Supple NROM, without any enlargements.  Lymph Node Survey No cervical supraclavicular or inguinal adenopathy Cardiovascular  Well perfused peripheries Lungs  No increased WOB Skin  No rash/lesions/breakdown  Psychiatry  Alert and oriented to person, place, and time  Abdomen  Normoactive bowel sounds, abdomen soft, non-tender and nonobese without evidence of hernia.  Back No CVA tenderness Genito Urinary  Vulva/vagina: Normal external female genitalia.   No lesions. No discharge or bleeding.  Bladder/urethra:  No lesions or masses, well supported bladder  Vagina: normal  Cervix: Normal appearing, no lesions.  Uterus:  Bulky, mobile, no parametrial involvement or nodularity.   Adnexa: no discretely palpable masses. Rectal  Good tone,  no masses no cul de sac nodularity.  Extremities  No bilateral cyanosis, clubbing or edema.  Procedure Note:  Preop Dx: *** Postop Dx: *** Procedure: *** Surgeon: Dorann Ou, MD EBL: *** Specimens: *** Complications: *** Procedure Details: ***    Thereasa Solo, MD  09/18/2020, 9:39 AM

## 2020-09-18 NOTE — Telephone Encounter (Signed)
ENCOUNTER OPENED IN ERROR

## 2020-09-18 NOTE — Progress Notes (Signed)
Follow-up Note: Gyn-Onc  Consult was initially requested by Dr. Stann Mainland for the evaluation of Nichole Walters 70 y.o. female  CC:  Chief Complaint  Patient presents with   Endometrial thickening on ultrasound     Assessment/Plan:  [name]  is a 70 y.o.  year old with bilateral ovarian cysts and uterine fibroids. She has a thickened endometrium.   I reviewed her [place] images from 71/22.  We will follow-up the results from today's biopsy.   I am recommending proceeding with surgery with hysterectomy/BSO for pathology and definitive diagnosis and management. She is a good surgical candidate overall with a favorable risk profile for surgery. I discussed common risks of surgery including  bleeding, infection, damage to internal organs (such as bladder,ureters, bowels), blood clot, reoperation and rehospitalization. I explained that this would be a same-day discharge outpatient procedure and discussed anticipated recovery time and restrictions. Her short stature and abdominal fat distribution predict a challenging surgery.   Surgery is scheduled for 10/22/20.  HPI: [name] is a 70 year old P1 who was seen in consultation at the request of Dr Stann Mainland for a thickened endometrium (new finding) in the setting of fibroid uterus and bilateral ovarian cysts.  The patient reported that she has been known to have fibroids for many years. She had an episode of vaginal bleeding in 2017 which was managed with a hysteroscopy D&C and polypectomy for benign pathology. She had no bleeding since that time.  She underwent MRI spine in early 2020 for back pain. This detected ovarian cysts and she then underwent a TVUS on 05/12/18. This showed a uterus measuring 8.8x4.3x8cm with fibroids and an endometrial thickness of 59mm. A fundal fibroid measured 1.7cm and a right lateral fibroid measured 3.8cm. The right ovary contained a simple cyst measuring 4.8x3.1x3.1cm nd hte left ovary contained a simple  cyst measuring 7.2x5x5.9cm.   An MRI was performed of the pelvis on 05/24/18 which showed am 8x4x6cm uterus with several uterine fibroids again noted including an exophytic fibroid projecting from the lower anterior segment on the left measuring 4.1cm. There was a 5cm simple cyst on the right and a 7.4cm left ovarian simple cyst. No ascites or peritoneal nodularity was seen.  Endometrial biopsy was performed on 5//18/20 and revealed simple hyperplasia without atypia. Pap was normal.   CA 125 was drawn on 08/05/18 and was normal at 8.2. She was offered either hysterectomy with BSO, BSO alone, or expectant management and she elected for the latter.  On 05/17/19 she underwent repeat TVUS which showed a uterus with measurements: 8.4 x 4.7 x 5.1 cm and multiple small (<3cm) fibroids. The endometrial thickness was 12 mm. There was a small endometrial cyst 4 x 3 x 4 mm. No additional masses. Large simple cyst LEFT adnexa, 7.4 x 6.2 x 5.2cm. Additional cyst in RIGHT adnexa 5.3 x 3.2 x 3.7 cm, with minimal dependent debris. No additional adnexal masses. These were shown to be likely arising from the ovaries on a prior MR and are grossly unchanged in size. Follow-up imaging recommended in 1 year to assess continued stability.  At that time she denied vaginal bleeding.   She was offered robotic assisted total hysterectomy and BSO, possible staging when she was initially evaluated, and was somewhat interested in surgery, but desired delaying the surgery until after June, 2021.  Interval Hx:  She returned to see Dr Stann Mainland and a repeat US was performed on 08/30/20. She denied symptoms of bleeding.  The ultrasound showed stable ovarian simple cysts bilaterally and a thickened endometrium of 57mm. The uterus measured 9x4.3x5.7cm.  She denied symptoms of vaginal bleeding.   Current Meds:  Outpatient Encounter Medications as of 09/18/2020  Medication Sig   amLODipine (NORVASC) 5 MG tablet TAKE 1 TABLET BY MOUTH EVERY  DAY   calcium carbonate (CALTRATE 600) 1500 (600 Ca) MG TABS tablet Take 600 mg of elemental calcium by mouth 2 (two) times daily with a meal.   cholecalciferol (VITAMIN D) 1000 units tablet Take 1,000 Units by mouth daily.   latanoprost (XALATAN) 0.005 % ophthalmic solution Place 1 drop into both eyes at bedtime.   OVER THE COUNTER MEDICATION Take 2 capsules by mouth 3 (three) times daily. Ligaplex II 5300 - herbal supplement   pravastatin (PRAVACHOL) 20 MG tablet TAKE 1 TABLET BY MOUTH EVERY DAY   valsartan-hydrochlorothiazide (DIOVAN-HCT) 160-12.5 MG tablet Take 1 tablet by mouth daily.   No facility-administered encounter medications on file as of 09/18/2020.    Allergy:  Allergies  Allergen Reactions   Celexa [Citalopram] Swelling    Mouth swells    Social Hx:   Social History   Socioeconomic History   Marital status: Single    Spouse name: Not on file   Number of children: Not on file   Years of education: Not on file   Highest education level: Not on file  Occupational History   Not on file  Tobacco Use   Smoking status: Never   Smokeless tobacco: Never  Vaping Use   Vaping Use: Never used  Substance and Sexual Activity   Alcohol use: No   Drug use: No   Sexual activity: Not Currently    Birth control/protection: Post-menopausal  Other Topics Concern   Not on file  Social History Narrative   Single mother of one, grandmother 21, great-grandmother 2.   Does not smoke, does not drink.   Exercises usually on treadmill mostly 3 days a week.   Social Determinants of Health   Financial Resource Strain: Not on file  Food Insecurity: Not on file  Transportation Needs: Not on file  Physical Activity: Not on file  Stress: Not on file  Social Connections: Not on file  Intimate Partner Violence: Not on file    Past Surgical Hx:  Past Surgical History:  Procedure Laterality Date   CARDIAC CATHETERIZATION  04/25/2001   EF>60%   COLONOSCOPY     DOPPLER  ECHOCARDIOGRAPHY  02/13/2009   EF>55%   HYSTEROSCOPY N/A 07/22/2015   Procedure: HYSTEROSCOPY;  Surgeon: Alden Hipp, MD;  Location: [facility];  Service: Gynecology;  Laterality: N/A;   NM MYOVIEW LTD  02/13/2009   EF 70%  Low risk scan   NM PERSANTINE MYOVIEW LTD  December 2010   EF> 70%. No ischemia or infarction   TRANSTHORACIC ECHOCARDIOGRAM  December 2010   Normal LV size and function. Mild MR and TR. Otherwise normal. Normal diastolic function.   WISDOM TOOTH EXTRACTION      Past Medical Hx:  Past Medical History:  Diagnosis Date   Anxiety    no meds   Dyslipidemia 05/08/2013   Essential hypertension 05/08/2013   Palpitations 05/08/2013   Hx    SVD (spontaneous vaginal delivery)    x 1    Past Gynecological History:  SVD x 1 No LMP recorded. Patient is postmenopausal.  Family Hx:  Family History  Problem Relation Age of Onset   Colon cancer Sister    Colon cancer Brother  Colon cancer Cousin     Review of Systems:  Constitutional  Feels well,    ENT Normal appearing ears and nares bilaterally Skin/Breast  No rash, sores, jaundice, itching, dryness Cardiovascular  No chest pain, shortness of breath, or edema  Pulmonary  No cough or wheeze.  Gastro Intestinal  No nausea, vomitting, or diarrhoea. No bright red blood per rectum, no abdominal pain, change in bowel movement, or constipation.  Genito Urinary  No frequency, urgency, dysuria, no bleeding  Musculo Skeletal  No myalgia, arthralgia, joint swelling or pain  Neurologic  No weakness, numbness, change in gait,  Psychology  No depression, anxiety, insomnia.   Vitals:  Blood pressure 128/71, pulse 69, temperature 99.1 F (37.3 C), temperature source Tympanic, resp. rate 16, height 5' (1.524 m), weight 144 lb 11.2 oz (65.6 kg), SpO2 99 %.  Physical Exam: WD in NAD Neck  Supple NROM, without any enlargements.  Lymph Node Survey No cervical supraclavicular or inguinal adenopathy Cardiovascular  Well  perfused peripheries Lungs  No increased WOB Skin  No rash/lesions/breakdown  Psychiatry  Alert and oriented to person, place, and time  Abdomen  Normoactive bowel sounds, abdomen soft, non-tender and nonobese without evidence of hernia.  Back No CVA tenderness Genito Urinary  Vulva/vagina: Normal external female genitalia.   No lesions. No discharge or bleeding.  Bladder/urethra:  No lesions or masses, well supported bladder  Vagina: normal  Cervix: Normal appearing, no lesions.  Uterus:  Bulky, mobile, no parametrial involvement or nodularity.   Adnexa: no discretely palpable masses. Rectal  Good tone, no masses no cul de sac nodularity.  Extremities  No bilateral cyanosis, clubbing or edema.  Procedure Note:  Preop Dx: thickened endometrium Postop Dx: same Procedure: endometrial biopsy Surgeon: Dorann Ou, MD EBL: scant Specimens: endometrium Complications: none Procedure Details: the patient was verbally consented and time out was peformed. The cervix was visualized. The pipelle was inserted to a 7cm depth and 2 passes of the endometrial cavity took place for scant tissue. She tolerated the procedure well and it was sent for pathology.    Thereasa Solo, MD  09/18/2020, 2:49 PM

## 2020-09-18 NOTE — Patient Instructions (Signed)
Preparing for your Surgery  Plan for surgery on October 22, 2020 with Dr. Everitt Amber at [place] will be scheduled for a robotic assisted total laparoscopic hysterectomy (removal of the uterus and cervix), bilateral salpingo-oophorectomy (removal of both ovaries and fallopian tubes), possible staging if a cancer is identified.  Pre-operative Testing -You will receive a phone call from presurgical testing at [facility] to arrange for a pre-operative appointment and lab work.  -Bring your insurance card, copy of an advanced directive if applicable, medication list  -At that visit, you will be asked to sign a consent for a possible blood transfusion in case a transfusion becomes necessary during surgery.  The need for a blood transfusion is rare but having consent is a necessary part of your care.     -You should not be taking blood thinners or aspirin at least ten days prior to surgery unless instructed by your surgeon.  -Do not take supplements such as fish oil (omega 3), red yeast rice, turmeric before your surgery. You want to avoid medications with aspirin in them including headache powders such as BC or Goody's), Excedrin migraine.  Day Before Surgery at [place] will be asked to take in a light diet the day before surgery. You will be advised you can have clear liquids up until 3 hours before your surgery.    Eat a light diet the day before surgery.  Examples including soups, broths, toast, yogurt, mashed potatoes.  AVOID GAS PRODUCING FOODS. Things to avoid include carbonated beverages (fizzy beverages, sodas), raw fruits and raw vegetables (uncooked), or beans.   If your bowels are filled with gas, your surgeon will have difficulty visualizing your pelvic organs which increases your surgical risks.  Your role in recovery Your role is to become active as soon as directed by your doctor, while still giving yourself time to heal.  Rest when you feel tired. You  will be asked to do the following in order to speed your recovery:  - Cough and breathe deeply. This helps to clear and expand your lungs and can prevent pneumonia after surgery.  - [place]. Do mild physical activity. Walking or moving your legs help your circulation and body functions return to normal. Do not try to get up or walk alone the first time after surgery.   -If you develop swelling on one leg or the other, pain in the back of your leg, redness/warmth in one of your legs, please call the office or go to the Emergency Room to have a doppler to rule out a blood clot. For shortness of breath, chest pain-seek care in the Emergency Room as soon as possible. - Actively manage your pain. Managing your pain lets you move in comfort. We will ask you to rate your pain on a scale of zero to 10. It is your responsibility to tell your doctor or nurse where and how much you hurt so your pain can be treated.  Special Considerations -If you are diabetic, you may be placed on insulin after surgery to have closer control over your blood sugars to promote healing and recovery.  This does not mean that you will be discharged on insulin.  If applicable, your oral antidiabetics will be resumed when you are tolerating a solid diet.  -Your final pathology results from surgery should be available around one week after surgery and the results will be relayed to you when available.  -Dr. Lahoma Crocker  is the surgeon that assists your GYN Oncologist with surgery.  If you end up staying the night, the next day after your surgery you will either see Dr. Denman George, Dr. Berline Lopes, or Dr. Lahoma Crocker.  -FMLA forms can be faxed to [phone] and please allow 5-7 business days for completion.  Pain Management After Surgery -You have been prescribed your pain medication and bowel regimen medications before surgery so that you can have these available when you are discharged from the hospital.  The pain medication is for use ONLY AFTER surgery and a new prescription will not be given.   -Make sure that you have Tylenol and Ibuprofen at home to use on a regular basis after surgery for pain control. We recommend alternating the medications every hour to six hours since they work differently and are processed in the body differently for pain relief.  -Review the attached handout on narcotic use and their risks and side effects.   Bowel Regimen -You have been prescribed Sennakot-S to take nightly to prevent constipation especially if you are taking the narcotic pain medication intermittently.  It is important to prevent constipation and drink adequate amounts of liquids. You can stop taking this medication when you are not taking pain medication and you are back on your normal bowel routine.  Risks of Surgery Risks of surgery are low but include bleeding, infection, damage to surrounding structures, re-operation, blood clots, and very rarely death.   Blood Transfusion Information (For the consent to be signed before surgery)  We will be checking your blood type before surgery so in case of emergencies, we will know what type of blood you would need.                                            WHAT IS A BLOOD TRANSFUSION?  A transfusion is the replacement of blood or some of its parts. Blood is made up of multiple cells which provide different functions. Red blood cells carry oxygen and are used for blood loss replacement. White blood cells fight against infection. Platelets control bleeding. Plasma helps clot blood. Other blood products are available for specialized needs, such as hemophilia or other clotting disorders. BEFORE THE TRANSFUSION  Who gives blood for transfusions?  You may be able to donate blood to be used at a later date on yourself (autologous donation). Relatives can be asked to donate blood. This is generally not any safer than if you have received blood from a  stranger. The same precautions are taken to ensure safety when a relative's blood is donated. Healthy volunteers who are fully evaluated to make sure their blood is safe. This is blood bank blood. Transfusion therapy is the safest it has ever been in the practice of medicine. Before blood is taken from a donor, a complete history is taken to make sure that person has no history of diseases nor engages in risky social behavior (examples are intravenous drug use or sexual activity with multiple partners). The donor's travel history is screened to minimize risk of transmitting infections, such as malaria. The donated blood is tested for signs of infectious diseases, such as HIV and hepatitis. The blood is then tested to be sure it is compatible with you in order to minimize the chance of a transfusion reaction. If you or a relative donates blood, this is often done in anticipation of  surgery and is not appropriate for emergency situations. It takes many days to process the donated blood. RISKS AND COMPLICATIONS Although transfusion therapy is very safe and saves many lives, the main dangers of transfusion include:  Getting an infectious disease. Developing a transfusion reaction. This is an allergic reaction to something in the blood you were given. Every precaution is taken to prevent this. The decision to have a blood transfusion has been considered carefully by your caregiver before blood is given. Blood is not given unless the benefits outweigh the risks.  AFTER SURGERY INSTRUCTIONS  Return to work: 4 weeks if applicable  Activity: 1. Be up and out of the bed during the day.  Take a nap if needed.  You may walk up steps but be careful and use the hand rail.  Stair climbing will tire you more than you think, you may need to stop part way and rest.   2. No lifting or straining for 6 weeks over 10 pounds. No pushing, pulling, straining for 6 weeks.  3. No driving for 1 week(s).  Do not drive if you  are taking narcotic pain medicine and make sure that your reaction time has returned.   4. You can shower as soon as the next day after surgery. Shower daily.  Use your regular soap and water (not directly on the incision) and pat your incision(s) dry afterwards; don't rub.  No tub baths or submerging your body in water until cleared by your surgeon. If you have the soap that was given to you by pre-surgical testing that was used before surgery, you do not need to use it afterwards because this can irritate your incisions.   5. No sexual activity and nothing in the vagina for 8 weeks.  6. You may experience a small amount of clear drainage from your incisions, which is normal.  If the drainage persists, increases, or changes color please call the office.  7. Do not use creams, lotions, or ointments such as neosporin on your incisions after surgery until advised by your surgeon because they can cause removal of the dermabond glue on your incisions.    8. You may experience vaginal spotting after surgery or around the 6-8 week mark from surgery when the stitches at the top of the vagina begin to dissolve.  The spotting is normal but if you experience heavy bleeding, call our office.  9. Take Tylenol or ibuprofen first for pain and only use narcotic pain medication for severe pain not relieved by the Tylenol or Ibuprofen.  Monitor your Tylenol intake to a max of 4,000 mg in a 24 hour period. You can alternate these medications after surgery.  Diet: 1. Low sodium Heart Healthy Diet is recommended but you are cleared to resume your normal (before surgery) diet after your procedure.  2. It is safe to use a laxative, such as Miralax or Colace, if you have difficulty moving your bowels. You have been prescribed Sennakot at bedtime every evening to keep bowel movements regular and to prevent constipation.    Wound Care: 1. Keep clean and dry.  Shower daily.  Reasons to call the Doctor: Fever - Oral  temperature greater than 100.4 degrees Fahrenheit Foul-smelling vaginal discharge Difficulty urinating Nausea and vomiting Increased pain at the site of the incision that is unrelieved with pain medicine. Difficulty breathing with or without chest pain New calf pain especially if only on one side Sudden, continuing increased vaginal bleeding with or without clots.  Contacts: For questions or concerns you should contact:  Dr. Everitt Amber at [phone]  [name], NP at [phone]  After Hours: call [phone] and have the GYN Oncologist paged/contacted (after 5 pm or on the weekends).  Messages sent via mychart are for non-urgent matters and are not responded to after hours so for urgent needs, please call the after hours number.

## 2020-09-18 NOTE — Addendum Note (Signed)
Addended by: Thereasa Solo on: 09/18/2020 04:34 PM   Modules accepted: Orders

## 2020-09-20 LAB — SURGICAL PATHOLOGY

## 2020-09-20 NOTE — Progress Notes (Signed)
Patient here for follow up with Dr. Everitt Amber and for a pre-operative discussion prior to her scheduled surgery on October 22, 2020. She is scheduled for  robotic assisted total laparoscopic hysterectomy, bilateral salpingo-oophorectomy, possible staging if a cancer is identified. The surgery was discussed in detail.  See after visit summary for additional details. Visual aids used to discuss items related to surgery including sequential compression stockings, foley catheter, IV pump, multi-modal pain regimen including tylenol, photo of the surgical robot, female reproductive system to discuss surgery in detail.      Discussed post-op pain management in detail including the aspects of the enhanced recovery pathway.  Advised her that a new prescription would be sent in for tramadol and it is only to be used for after her upcoming surgery.  We discussed the use of tylenol post-op and to monitor for a maximum of 4,000 mg in a 24 hour period.  Also prescribed sennakot to be used after surgery and to hold if having loose stools.  Discussed bowel regimen in detail.     Discussed the use of SCDs and measures to take at home to prevent DVT including frequent mobility.  Reportable signs and symptoms of DVT discussed. Post-operative instructions discussed and expectations for after surgery. Incisional care discussed as well including reportable signs and symptoms including erythema, drainage, wound separation.     10 minutes spent with the patient.  Verbalizing understanding of material discussed. No needs or concerns voiced at the end of the visit.   Advised patient to call for any needs.  Advised that her post-operative medications had been prescribed and could be picked up at any time.     This appointment is included in the global surgical bundle as pre-operative teaching and has no charge.

## 2020-09-20 NOTE — Patient Instructions (Signed)
Preparing for your Surgery   Plan for surgery on October 22, 2020 with Dr. Everitt Amber at [place] will be scheduled for a robotic assisted total laparoscopic hysterectomy (removal of the uterus and cervix), bilateral salpingo-oophorectomy (removal of both ovaries and fallopian tubes), possible staging if a cancer is identified.   Pre-operative Testing -You will receive a phone call from presurgical testing at [facility] to arrange for a pre-operative appointment and lab work.   -Bring your insurance card, copy of an advanced directive if applicable, medication list   -At that visit, you will be asked to sign a consent for a possible blood transfusion in case a transfusion becomes necessary during surgery.  The need for a blood transfusion is rare but having consent is a necessary part of your care.      -You should not be taking blood thinners or aspirin at least ten days prior to surgery unless instructed by your surgeon.   -Do not take supplements such as fish oil (omega 3), red yeast rice, turmeric before your surgery. You want to avoid medications with aspirin in them including headache powders such as BC or Goody's), Excedrin migraine.   Day Before Surgery at [place] will be asked to take in a light diet the day before surgery. You will be advised you can have clear liquids up until 3 hours before your surgery.     Eat a light diet the day before surgery.  Examples including soups, broths, toast, yogurt, mashed potatoes.  AVOID GAS PRODUCING FOODS. Things to avoid include carbonated beverages (fizzy beverages, sodas), raw fruits and raw vegetables (uncooked), or beans.   If your bowels are filled with gas, your surgeon will have difficulty visualizing your pelvic organs which increases your surgical risks.   Your role in recovery Your role is to become active as soon as directed by your doctor, while still giving yourself time to heal.  Rest when you feel  tired. You will be asked to do the following in order to speed your recovery:   - Cough and breathe deeply. This helps to clear and expand your lungs and can prevent pneumonia after surgery. - [place]. Do mild physical activity. Walking or moving your legs help your circulation and body functions return to normal. Do not try to get up or walk alone the first time after surgery.   -If you develop swelling on one leg or the other, pain in the back of your leg, redness/warmth in one of your legs, please call the office or go to the Emergency Room to have a doppler to rule out a blood clot. For shortness of breath, chest pain-seek care in the Emergency Room as soon as possible. - Actively manage your pain. Managing your pain lets you move in comfort. We will ask you to rate your pain on a scale of zero to 10. It is your responsibility to tell your doctor or nurse where and how much you hurt so your pain can be treated.   Special Considerations -If you are diabetic, you may be placed on insulin after surgery to have closer control over your blood sugars to promote healing and recovery.  This does not mean that you will be discharged on insulin.  If applicable, your oral antidiabetics will be resumed when you are tolerating a solid diet.   -Your final pathology results from surgery should be available around one week after surgery and the results  will be relayed to you when available.   -Dr. Lahoma Crocker is the surgeon that assists your GYN Oncologist with surgery.  If you end up staying the night, the next day after your surgery you will either see Dr. Denman George, Dr. Berline Lopes, or Dr. Lahoma Crocker.   -FMLA forms can be faxed to [phone] and please allow 5-7 business days for completion.   Pain Management After Surgery -You have been prescribed your pain medication and bowel regimen medications before surgery so that you can have these available when you are discharged from  the hospital. The pain medication is for use ONLY AFTER surgery and a new prescription will not be given.   -Make sure that you have Tylenol and Ibuprofen at home to use on a regular basis after surgery for pain control. We recommend alternating the medications every hour to six hours since they work differently and are processed in the body differently for pain relief.   -Review the attached handout on narcotic use and their risks and side effects.   Bowel Regimen -You have been prescribed Sennakot-S to take nightly to prevent constipation especially if you are taking the narcotic pain medication intermittently.  It is important to prevent constipation and drink adequate amounts of liquids. You can stop taking this medication when you are not taking pain medication and you are back on your normal bowel routine.   Risks of Surgery Risks of surgery are low but include bleeding, infection, damage to surrounding structures, re-operation, blood clots, and very rarely death.     Blood Transfusion Information (For the consent to be signed before surgery)   We will be checking your blood type before surgery so in case of emergencies, we will know what type of blood you would need.                                             WHAT IS A BLOOD TRANSFUSION?   A transfusion is the replacement of blood or some of its parts. Blood is made up of multiple cells which provide different functions. Red blood cells carry oxygen and are used for blood loss replacement. White blood cells fight against infection. Platelets control bleeding. Plasma helps clot blood. Other blood products are available for specialized needs, such as hemophilia or other clotting disorders. BEFORE THE TRANSFUSION Who gives blood for transfusions? You may be able to donate blood to be used at a later date on yourself (autologous donation). Relatives can be asked to donate blood. This is generally not any safer than if you have  received blood from a stranger. The same precautions are taken to ensure safety when a relative's blood is donated. Healthy volunteers who are fully evaluated to make sure their blood is safe. This is blood bank blood. Transfusion therapy is the safest it has ever been in the practice of medicine. Before blood is taken from a donor, a complete history is taken to make sure that person has no history of diseases nor engages in risky social behavior (examples are intravenous drug use or sexual activity with multiple partners). The donor's travel history is screened to minimize risk of transmitting infections, such as malaria. The donated blood is tested for signs of infectious diseases, such as HIV and hepatitis. The blood is then tested to be sure it is compatible with you in order to minimize the  chance of a transfusion reaction. If you or a relative donates blood, this is often done in anticipation of surgery and is not appropriate for emergency situations. It takes many days to process the donated blood. RISKS AND COMPLICATIONS Although transfusion therapy is very safe and saves many lives, the main dangers of transfusion include: Getting an infectious disease. Developing a transfusion reaction. This is an allergic reaction to something in the blood you were given. Every precaution is taken to prevent this. The decision to have a blood transfusion has been considered carefully by your caregiver before blood is given. Blood is not given unless the benefits outweigh the risks.   AFTER SURGERY INSTRUCTIONS   Return to work: 4 weeks if applicable   Activity: 1. Be up and out of the bed during the day.  Take a nap if needed.  You may walk up steps but be careful and use the hand rail.  Stair climbing will tire you more than you think, you may need to stop part way and rest.   2. No lifting or straining for 6 weeks over 10 pounds. No pushing, pulling, straining for 6 weeks.   3. No driving for 1  week(s).  Do not drive if you are taking narcotic pain medicine and make sure that your reaction time has returned.   4. You can shower as soon as the next day after surgery. Shower daily.  Use your regular soap and water (not directly on the incision) and pat your incision(s) dry afterwards; don't rub.  No tub baths or submerging your body in water until cleared by your surgeon. If you have the soap that was given to you by pre-surgical testing that was used before surgery, you do not need to use it afterwards because this can irritate your incisions.   5. No sexual activity and nothing in the vagina for 8 weeks.   6. You may experience a small amount of clear drainage from your incisions, which is normal.  If the drainage persists, increases, or changes color please call the office.   7. Do not use creams, lotions, or ointments such as neosporin on your incisions after surgery until advised by your surgeon because they can cause removal of the dermabond glue on your incisions.     8. You may experience vaginal spotting after surgery or around the 6-8 week mark from surgery when the stitches at the top of the vagina begin to dissolve.  The spotting is normal but if you experience heavy bleeding, call our office.   9. Take Tylenol or ibuprofen first for pain and only use narcotic pain medication for severe pain not relieved by the Tylenol or Ibuprofen.  Monitor your Tylenol intake to a max of 4,000 mg in a 24 hour period. You can alternate these medications after surgery.   Diet: 1. Low sodium Heart Healthy Diet is recommended but you are cleared to resume your normal (before surgery) diet after your procedure.   2. It is safe to use a laxative, such as Miralax or Colace, if you have difficulty moving your bowels. You have been prescribed Sennakot at bedtime every evening to keep bowel movements regular and to prevent constipation.     Wound Care: 1. Keep clean and dry.  Shower daily.   Reasons  to call the Doctor: Fever - Oral temperature greater than 100.4 degrees Fahrenheit Foul-smelling vaginal discharge Difficulty urinating Nausea and vomiting Increased pain at the site of the incision that is unrelieved  with pain medicine. Difficulty breathing with or without chest pain New calf pain especially if only on one side Sudden, continuing increased vaginal bleeding with or without clots.   Contacts: For questions or concerns you should contact:   Dr. Everitt Amber at [phone]   [name], NP at [phone]   After Hours: call [phone] and have the GYN Oncologist paged/contacted (after 5 pm or on the weekends).   Messages sent via mychart are for non-urgent matters and are not responded to after hours so for urgent needs, please call the after hours number.

## 2020-10-14 NOTE — Patient Instructions (Addendum)
DUE TO COVID-19 ONLY ONE VISITOR IS ALLOWED TO COME WITH YOU AND STAY IN THE WAITING ROOM ONLY DURING PRE OP AND PROCEDURE.   **NO VISITORS ARE ALLOWED IN THE SHORT STAY AREA OR RECOVERY ROOM!!**         Your procedure is scheduled on: Tuesday, 10-22-20   Report to [facility] Main  Entrance    Report to admitting at 6:30 AM   Call this number if you have problems the morning of surgery [phone]   Follow a light diet day before surgery, avoid gas producing foods   Do not eat food :After Midnight.   May have liquids until 5:30 AM  day of surgery  CLEAR LIQUID DIET  Foods Allowed                                                                     Foods Excluded  Water, Black Coffee and tea, regular and decaf           liquids that you cannot  Plain Jell-O in any flavor  (No red)                                  see through such as: Fruit ices (not with fruit pulp)                                      milk, soups, orange juice              Iced Popsicles (No red)                                      All solid food                                   Apple juices Sports drinks like Gatorade (No red) Lightly seasoned clear broth or consume(fat free) Sugar, honey syrup      Oral Hygiene is also important to reduce your risk of infection.                                    Remember - BRUSH YOUR TEETH THE MORNING OF SURGERY WITH YOUR REGULAR TOOTHPASTE   Do NOT smoke after Midnight   Take these medicines the morning of surgery with A SIP OF WATER:  Amlodipine, Pravastatin, Tramadol                              You may not have any metal on your body including hair pins, jewelry, and body piercing             Do not wear make-up, lotions, powders, perfumes or deodorant  Do not wear nail polish including gel and S&S, artificial/acrylic nails, or any other type of covering on natural nails including finger and  toenails. If you have artificial nails, gel coating, etc.  that needs to be removed by a nail salon please have this removed prior to surgery or surgery may need to be canceled/ delayed if the surgeon/ anesthesia feels like they are unable to be safely monitored.   Do not shave  48 hours prior to surgery.     Do not bring valuables to the hospital. [place].   Contacts, dentures or bridgework may not be worn into surgery.    Patients discharged the day of surgery will not be allowed to drive home.    Please read over the following fact sheets you were given: IF YOU HAVE QUESTIONS ABOUT YOUR PRE OP INSTRUCTIONS PLEASE CALL [facility] - Preparing for Surgery Before surgery, you can play an important role.  Because skin is not sterile, your skin needs to be as free of germs as possible.  You can reduce the number of germs on your skin by washing with CHG (chlorahexidine gluconate) soap before surgery.  CHG is an antiseptic cleaner which kills germs and bonds with the skin to continue killing germs even after washing. Please DO NOT use if you have an allergy to CHG or antibacterial soaps.  If your skin becomes reddened/irritated stop using the CHG and inform your nurse when you arrive at Short Stay. Do not shave (including legs and underarms) for at least 48 hours prior to the first CHG shower.  You may shave your face/neck.  Please follow these instructions carefully:  1.  Shower with CHG Soap the night before surgery and the  morning of surgery.  2.  If you choose to wash your hair, wash your hair first as usual with your normal  shampoo.  3.  After you shampoo, rinse your hair and body thoroughly to remove the shampoo.                             4.  Use CHG as you would any other liquid soap.  You can apply chg directly to the skin and wash.  Gently with a scrungie or clean washcloth.  5.  Apply the CHG Soap to your body ONLY FROM THE NECK DOWN.   Do   not use on face/ open                            Wound or open sores. Avoid contact with eyes, ears mouth and   genitals (private parts).                       Wash face,  Genitals (private parts) with your normal soap.             6.  Wash thoroughly, paying special attention to the area where your    surgery  will be performed.  7.  Thoroughly rinse your body with warm water from the neck down.  8.  DO NOT shower/wash with your normal soap after using and rinsing off the CHG Soap.                9.  Pat yourself dry with a clean towel.            10.  Wear clean pajamas.            11.  Place clean sheets on your bed the night of your first shower and do not  sleep with pets. Day of Surgery : Do not apply any lotions/deodorants the morning of surgery.  Please wear clean clothes to the hospital/surgery center.  FAILURE TO FOLLOW THESE INSTRUCTIONS MAY RESULT IN THE CANCELLATION OF YOUR SURGERY  PATIENT SIGNATURE_________________________________  NURSE SIGNATURE__________________________________  ________________________________________________________________________  WHAT IS A BLOOD TRANSFUSION? Blood Transfusion Information  A transfusion is the replacement of blood or some of its parts. Blood is made up of multiple cells which provide different functions. Red blood cells carry oxygen and are used for blood loss replacement. White blood cells fight against infection. Platelets control bleeding. Plasma helps clot blood. Other blood products are available for specialized needs, such as hemophilia or other clotting disorders. BEFORE THE TRANSFUSION  Who gives blood for transfusions?  Healthy volunteers who are fully evaluated to make sure their blood is safe. This is blood bank blood. Transfusion therapy is the safest it has ever been in the practice of medicine. Before blood is taken from a donor, a complete history is taken to make sure that person has no history of diseases nor engages in risky social behavior (examples are  intravenous drug use or sexual activity with multiple partners). The donor's travel history is screened to minimize risk of transmitting infections, such as malaria. The donated blood is tested for signs of infectious diseases, such as HIV and hepatitis. The blood is then tested to be sure it is compatible with you in order to minimize the chance of a transfusion reaction. If you or a relative donates blood, this is often done in anticipation of surgery and is not appropriate for emergency situations. It takes many days to process the donated blood. RISKS AND COMPLICATIONS Although transfusion therapy is very safe and saves many lives, the main dangers of transfusion include:  Getting an infectious disease. Developing a transfusion reaction. This is an allergic reaction to something in the blood you were given. Every precaution is taken to prevent this. The decision to have a blood transfusion has been considered carefully by your caregiver before blood is given. Blood is not given unless the benefits outweigh the risks. AFTER THE TRANSFUSION Right after receiving a blood transfusion, you will usually feel much better and more energetic. This is especially true if your red blood cells have gotten low (anemic). The transfusion raises the level of the red blood cells which carry oxygen, and this usually causes an energy increase. The nurse administering the transfusion will monitor you carefully for complications. HOME CARE INSTRUCTIONS  No special instructions are needed after a transfusion. You may find your energy is better. Speak with your caregiver about any limitations on activity for underlying diseases you may have. SEEK MEDICAL CARE IF:  Your condition is not improving after your transfusion. You develop redness or irritation at the intravenous (IV) site. SEEK IMMEDIATE MEDICAL CARE IF:  Any of the following symptoms occur over the next 12 hours: Shaking chills. You have a temperature by  mouth above 102 F (38.9 C), not controlled by medicine. Chest, back, or muscle pain. People around you feel you are not acting correctly or are confused. Shortness of breath or difficulty breathing. Dizziness and fainting. You get a rash or develop hives. You have a decrease in urine output. Your urine turns a dark color or changes to pink, red, or brown. Any of the following symptoms occur over the next 10 days: You have a temperature  by mouth above 102 F (38.9 C), not controlled by medicine. Shortness of breath. Weakness after normal activity. The white part of the eye turns yellow (jaundice). You have a decrease in the amount of urine or are urinating less often. Your urine turns a dark color or changes to pink, red, or brown. Document Released: 02/14/2000 Document Revised: 05/11/2011 Document Reviewed: 10/03/2007 [facility] Patient Information 2014 [place], [place].  _______________________________________________________________________

## 2020-10-14 NOTE — Progress Notes (Addendum)
COVID swab appointment: N/A  COVID Vaccine Completed:  Yes x3 Date COVID Vaccine completed: Has received booster:  Yes x1 COVID vaccine manufacturer: [employer]      Date of COVID positive in last 90 days: No  PCP - Betty Martinique, MD Cardiologist - Glenetta Hew, MD  Chest x-ray - N/A EKG - 10-15-20 Epic Stress Test - 2010 in [facility] - 2010 in Epic Cardiac Cath - greater than 2 years Pacemaker/ICD device last checked: Spinal Cord Stimulator:  Sleep Study - N/A CPAP -   Fasting Blood Sugar - N/A Checks Blood Sugar _____ times a day  Blood Thinner Instructions:  N/A Aspirin Instructions: Last Dose:  Activity level:  Can go up a flight of stairs and perform activities of daily living without stopping and without symptoms of chest pain or shortness of breath.      Anesthesia review: Followed by cardiology for palpitations, HTN.  Patient states that she has the palpitations periodically but denies SOB or chest pain associated with it.  Patient denies shortness of breath, fever, cough and chest pain at PAT appointment   Patient verbalized understanding of instructions that were given to them at the PAT appointment. Patient was also instructed that they will need to review over the PAT instructions again at home before surgery.

## 2020-10-15 ENCOUNTER — Encounter (HOSPITAL_COMMUNITY)
Admission: RE | Admit: 2020-10-15 | Discharge: 2020-10-15 | Disposition: A | Discharge status: Home / Self Care | Payer: Medicare Other | Source: Ambulatory Visit | Attending: Gynecologic Oncology | Admitting: Gynecologic Oncology

## 2020-10-15 ENCOUNTER — Encounter (HOSPITAL_COMMUNITY): Payer: Self-pay

## 2020-10-15 ENCOUNTER — Other Ambulatory Visit: Payer: Self-pay

## 2020-10-15 DIAGNOSIS — Z01818 Encounter for other preprocedural examination: Secondary | ICD-10-CM | POA: Insufficient documentation

## 2020-10-15 HISTORY — DX: Anemia, unspecified: D64.9

## 2020-10-15 HISTORY — DX: Unspecified glaucoma: H40.9

## 2020-10-15 HISTORY — DX: COVID-19: U07.1

## 2020-10-15 LAB — URINALYSIS, ROUTINE W REFLEX MICROSCOPIC
Bilirubin Urine: NEGATIVE
Glucose, UA: NEGATIVE mg/dL
Ketones, ur: NEGATIVE mg/dL
Leukocytes,Ua: NEGATIVE
Nitrite: NEGATIVE
Protein, ur: NEGATIVE mg/dL
Specific Gravity, Urine: 1.009 (ref 1.005–1.030)
pH: 6 (ref 5.0–8.0)

## 2020-10-15 LAB — COMPREHENSIVE METABOLIC PANEL
ALT: 18 U/L (ref 0–44)
AST: 23 U/L (ref 15–41)
Albumin: 4.3 g/dL (ref 3.5–5.0)
Alkaline Phosphatase: 53 U/L (ref 38–126)
Anion gap: 6 (ref 5–15)
BUN: 13 mg/dL (ref 8–23)
CO2: 28 mmol/L (ref 22–32)
Calcium: 9.3 mg/dL (ref 8.9–10.3)
Chloride: 102 mmol/L (ref 98–111)
Creatinine, Ser: 0.69 mg/dL (ref 0.44–1.00)
GFR, Estimated: >60 mL/min (ref >60)
Glucose, Bld: 81 mg/dL (ref 70–99)
Potassium: 3.6 mmol/L (ref 3.5–5.1)
Sodium: 136 mmol/L (ref 135–145)
Total Bilirubin: 0.9 mg/dL (ref 0.3–1.2)
Total Protein: 8.4 g/dL — ABNORMAL HIGH (ref 6.5–8.1)

## 2020-10-15 LAB — CBC
HCT: 41.5 % (ref 36.0–46.0)
Hemoglobin: 13 g/dL (ref 12.0–15.0)
MCH: 26.1 pg (ref 26.0–34.0)
MCHC: 31.3 g/dL (ref 30.0–36.0)
MCV: 83.2 fL (ref 80.0–100.0)
Platelets: 261 10*3/uL (ref 150–400)
RBC: 4.99 MIL/uL (ref 3.87–5.11)
RDW: 15.1 % (ref 11.5–15.5)
WBC: 4.9 10*3/uL (ref 4.0–10.5)
nRBC: 0 % (ref 0.0–0.2)

## 2020-10-16 ENCOUNTER — Telehealth: Payer: Self-pay

## 2020-10-16 NOTE — Telephone Encounter (Signed)
Received phone call from [facility], she had her pre-admission appointment for her upcoming surgery yesterday. She wanted to clarify a few things. Her pre-op instructions indicated she should take tramadol the morning of surgery. Instructed that this is for after surgery not before. Do not take tramadol before surgery.   She is also seeking guidance on her ligaplex II herbal supplement and if she needs to stop taking it. Per [name], NP she needs to stop taking this now. This can increase her risk of bleeding during the surgery. Patient verbalized understanding and will stop taking the supplement. Instructed to call with any questions.

## 2020-10-16 NOTE — Progress Notes (Signed)
Anesthesia Chart Review:   Case: [id number] Date/Time: 10/22/20 0815   Procedure: XI ROBOTIC ASSISTED TOTAL HYSTERECTOMY WITH BILATERAL SALPINGO OOPHORECTOMY, POSSIBLE STAGING   Anesthesia type: General   Pre-op diagnosis: BILATERAL OVARIAN CYSTS   Location: [facility] ROOM 03 / [facility]   Surgeons: Everitt Amber, MD       DISCUSSION: Pt is 70 years old with hx HTN, palpitations  VS: BP (!) 144/65   Pulse 67   Temp 36.9 C (Oral)   Resp 18   Ht 5' (1.524 m)   Wt 63.8 kg   SpO2 100%   BMI 27.46 kg/m   PROVIDERS: - PCP is Martinique, Betty G, MD - Cardiologist is Glenetta Hew, MD who sees pt for HTn and hx palpitations. Last office visit 09/28/19.   LABS: Labs reviewed: Acceptable for surgery. (all labs ordered are listed, but only abnormal results are displayed)  Labs Reviewed  COMPREHENSIVE METABOLIC PANEL - Abnormal; Notable for the following components:      Result Value   Total Protein 8.4 (*)    All other components within normal limits  URINALYSIS, ROUTINE W REFLEX MICROSCOPIC - Abnormal; Notable for the following components:   Hgb urine dipstick SMALL (*)    Bacteria, UA RARE (*)    All other components within normal limits  CBC  TYPE AND SCREEN    [id number] bradycardia. Anterior infarct, age undetermined. Appears stable compared with prior traciings   CV: none in >5 years  Past Medical History:  Diagnosis Date   Anemia    Anxiety    no meds   COVID-19    2022   Dyslipidemia 05/08/2013   Essential hypertension 05/08/2013   Glaucoma    Palpitations 05/08/2013   Hx    SVD (spontaneous vaginal delivery)    x 1    Past Surgical History:  Procedure Laterality Date   CARDIAC CATHETERIZATION  04/25/2001   EF>60%   COLONOSCOPY     DOPPLER ECHOCARDIOGRAPHY  02/13/2009   EF>55%   EXCISION MASS NECK     HYSTEROSCOPY N/A 07/22/2015   Procedure: HYSTEROSCOPY;  Surgeon: Alden Hipp, MD;  Location: [facility];  Service: Gynecology;  Laterality: N/A;   NM MYOVIEW LTD   02/13/2009   EF 70%  Low risk scan   NM PERSANTINE MYOVIEW LTD  01/30/2009   EF> 70%. No ischemia or infarction   TOE SURGERY     TRANSTHORACIC ECHOCARDIOGRAM  01/30/2009   Normal LV size and function. Mild MR and TR. Otherwise normal. Normal diastolic function.   WISDOM TOOTH EXTRACTION      MEDICATIONS:  amLODipine (NORVASC) 5 MG tablet   calcium carbonate (CALTRATE 600) 1500 (600 Ca) MG TABS tablet   ibuprofen (ADVIL) 600 MG tablet   latanoprost (XALATAN) 0.005 % ophthalmic solution   OVER THE COUNTER MEDICATION   pravastatin (PRAVACHOL) 20 MG tablet   senna-docusate (SENOKOT-S) 8.6-50 MG tablet   traMADol (ULTRAM) 50 MG tablet   valsartan-hydrochlorothiazide (DIOVAN-HCT) 160-12.5 MG tablet   No current facility-administered medications for this encounter.    If no changes, I anticipate pt can proceed with surgery as scheduled.   Willeen Cass, PhD, FNP-BC [facility]/Anesthesiology Phone: [phone] 10/16/2020 10:21 AM

## 2020-10-16 NOTE — Anesthesia Preprocedure Evaluation (Addendum)
Anesthesia Evaluation  Patient identified by MRN, date of birth, ID band Patient awake    Reviewed: Allergy & Precautions, NPO status , Patient's Chart, lab work & pertinent test results  History of Anesthesia Complications Negative for: history of anesthetic complications  Airway Mallampati: II  TM Distance: >3 FB Neck ROM: Full    Dental  (+) Dental Advisory Given, Teeth Intact   Pulmonary neg pulmonary ROS,    Pulmonary exam normal        Cardiovascular hypertension, Pt. on medications Normal cardiovascular exam     Neuro/Psych negative neurological ROS     GI/Hepatic negative GI ROS, Neg liver ROS,   Endo/Other  negative endocrine ROS  Renal/GU negative Renal ROS  negative genitourinary   Musculoskeletal negative musculoskeletal ROS (+)   Abdominal   Peds  Hematology negative hematology ROS (+)   Anesthesia Other Findings  Overall unremarkable echo with normal EF in 2010  Reproductive/Obstetrics Bilateral ovarian cysts                           Anesthesia Physical Anesthesia Plan  ASA: 2  Anesthesia Plan: General   Post-op Pain Management:    Induction: Intravenous  PONV Risk Score and Plan: 3 and Ondansetron, Dexamethasone, Treatment may vary due to age or medical condition and Midazolam  Airway Management Planned: Oral ETT  Additional Equipment: None  Intra-op Plan:   Post-operative Plan: Extubation in OR  Informed Consent: I have reviewed the patients History and Physical, chart, labs and discussed the procedure including the risks, benefits and alternatives for the proposed anesthesia with the patient or authorized representative who has indicated his/her understanding and acceptance.     Dental advisory given  Plan Discussed with:   Anesthesia Plan Comments: (See APP note by Durel Salts, FNP )       Anesthesia Quick Evaluation

## 2020-10-17 ENCOUNTER — Telehealth: Payer: Self-pay

## 2020-10-17 NOTE — Telephone Encounter (Signed)
Told [name] that Zoila Shutter said it would be fine for her to use either the Biofreeze or CBD oil on her back.  Pt verbalized understanding.

## 2020-10-21 ENCOUNTER — Telehealth: Payer: Self-pay

## 2020-10-21 NOTE — Telephone Encounter (Signed)
Left message requesting return call

## 2020-10-21 NOTE — Telephone Encounter (Signed)
Telephone call to check on pre-operative status.  Patient compliant with pre-operative instructions.  Reinforced NPO after midnight.  No questions or concerns voiced.  Instructed to call for any needs.   

## 2020-10-22 ENCOUNTER — Ambulatory Visit (HOSPITAL_COMMUNITY): Payer: Medicare Other | Admitting: Emergency Medicine

## 2020-10-22 ENCOUNTER — Encounter (HOSPITAL_COMMUNITY)
Admission: RE | Disposition: A | Discharge status: Home / Self Care | Payer: Self-pay | Source: Home / Self Care | Attending: Gynecologic Oncology

## 2020-10-22 ENCOUNTER — Ambulatory Visit (HOSPITAL_COMMUNITY)
Admission: RE | Admit: 2020-10-22 | Discharge: 2020-10-22 | Disposition: A | Discharge status: Home / Self Care | Payer: Medicare Other | Attending: Gynecologic Oncology | Admitting: Gynecologic Oncology

## 2020-10-22 ENCOUNTER — Ambulatory Visit (HOSPITAL_COMMUNITY): Payer: Medicare Other | Admitting: Certified Registered Nurse Anesthetist

## 2020-10-22 ENCOUNTER — Encounter (HOSPITAL_COMMUNITY): Payer: Self-pay | Admitting: Gynecologic Oncology

## 2020-10-22 DIAGNOSIS — N83201 Unspecified ovarian cyst, right side: Secondary | ICD-10-CM

## 2020-10-22 DIAGNOSIS — N800 Endometriosis of uterus: Secondary | ICD-10-CM

## 2020-10-22 DIAGNOSIS — E559 Vitamin D deficiency, unspecified: Secondary | ICD-10-CM | POA: Diagnosis not present

## 2020-10-22 DIAGNOSIS — N840 Polyp of corpus uteri: Secondary | ICD-10-CM | POA: Insufficient documentation

## 2020-10-22 DIAGNOSIS — D271 Benign neoplasm of left ovary: Secondary | ICD-10-CM | POA: Diagnosis not present

## 2020-10-22 DIAGNOSIS — Z8616 Personal history of COVID-19: Secondary | ICD-10-CM | POA: Diagnosis not present

## 2020-10-22 DIAGNOSIS — D259 Leiomyoma of uterus, unspecified: Secondary | ICD-10-CM | POA: Insufficient documentation

## 2020-10-22 DIAGNOSIS — Z79899 Other long term (current) drug therapy: Secondary | ICD-10-CM | POA: Diagnosis not present

## 2020-10-22 DIAGNOSIS — N8501 Benign endometrial hyperplasia: Secondary | ICD-10-CM | POA: Diagnosis present

## 2020-10-22 DIAGNOSIS — I10 Essential (primary) hypertension: Secondary | ICD-10-CM | POA: Diagnosis not present

## 2020-10-22 DIAGNOSIS — D270 Benign neoplasm of right ovary: Secondary | ICD-10-CM | POA: Diagnosis not present

## 2020-10-22 HISTORY — PX: ROBOTIC ASSISTED TOTAL HYSTERECTOMY WITH BILATERAL SALPINGO OOPHERECTOMY: SHX6086

## 2020-10-22 LAB — ABO/RH: ABO/RH(D): B POS

## 2020-10-22 LAB — TYPE AND SCREEN
ABO/RH(D): B POS
Antibody Screen: NEGATIVE

## 2020-10-22 SURGERY — HYSTERECTOMY, TOTAL, ROBOT-ASSISTED, LAPAROSCOPIC, WITH BILATERAL SALPINGO-OOPHORECTOMY
Anesthesia: General | Site: Uterus

## 2020-10-22 MED ORDER — ORAL CARE MOUTH RINSE: 15.0000 mL | Freq: Once | OROMUCOSAL | Status: AC

## 2020-10-22 MED ORDER — PHENYLEPHRINE HCL (PRESSORS) 10 MG/ML IV SOLN
INTRAVENOUS | Status: AC
  Filled 2020-10-22: qty 2

## 2020-10-22 MED ORDER — FENTANYL CITRATE (PF) 100 MCG/2ML IJ SOLN
INTRAMUSCULAR | Status: AC
  Filled 2020-10-22: qty 2

## 2020-10-22 MED ORDER — STERILE WATER FOR INJECTION IJ SOLN
INTRAMUSCULAR | Status: DC | PRN
  Administered 2020-10-22: 1 mL

## 2020-10-22 MED ORDER — MIDAZOLAM HCL 2 MG/2ML IJ SOLN
INTRAMUSCULAR | Status: AC
  Filled 2020-10-22: qty 2

## 2020-10-22 MED ORDER — FENTANYL CITRATE (PF) 100 MCG/2ML IJ SOLN
INTRAMUSCULAR | Status: DC | PRN
  Administered 2020-10-22: 100 ug via INTRAVENOUS

## 2020-10-22 MED ORDER — CHLORHEXIDINE GLUCONATE 0.12 % MT SOLN
15.0000 mL | Freq: Once | OROMUCOSAL | Status: AC
  Administered 2020-10-22: 15 mL via OROMUCOSAL

## 2020-10-22 MED ORDER — STERILE WATER FOR INJECTION IJ SOLN
INTRAMUSCULAR | Status: DC | PRN
  Administered 2020-10-22: 4 mL via SURGICAL_CAVITY

## 2020-10-22 MED ORDER — STERILE WATER FOR INJECTION IJ SOLN
INTRAMUSCULAR | Status: AC
  Filled 2020-10-22: qty 20

## 2020-10-22 MED ORDER — PHENYLEPHRINE 40 MCG/ML (10ML) SYRINGE FOR IV PUSH (FOR BLOOD PRESSURE SUPPORT)
PREFILLED_SYRINGE | INTRAVENOUS | Status: AC
  Filled 2020-10-22: qty 20

## 2020-10-22 MED ORDER — MIDAZOLAM HCL 5 MG/5ML IJ SOLN
INTRAMUSCULAR | Status: DC | PRN
  Administered 2020-10-22: 2 mg via INTRAVENOUS

## 2020-10-22 MED ORDER — STERILE WATER FOR IRRIGATION IR SOLN
Status: DC | PRN
  Administered 2020-10-22: 3000 mL

## 2020-10-22 MED ORDER — OXYCODONE HCL 5 MG/5ML PO SOLN
5.0000 mg | Freq: Once | ORAL | Status: DC | PRN
Start: 2020-10-22 — End: 2020-10-22

## 2020-10-22 MED ORDER — LACTATED RINGERS IR SOLN
Status: DC | PRN
  Administered 2020-10-22: 1000 mL

## 2020-10-22 MED ORDER — ACETAMINOPHEN 500 MG PO TABS
1000.0000 mg | ORAL_TABLET | ORAL | Status: AC
  Administered 2020-10-22: 1000 mg via ORAL
  Filled 2020-10-22: qty 2

## 2020-10-22 MED ORDER — PROPOFOL 10 MG/ML IV BOLUS
INTRAVENOUS | Status: DC | PRN
  Administered 2020-10-22: 120 mg via INTRAVENOUS

## 2020-10-22 MED ORDER — KETAMINE HCL 10 MG/ML IJ SOLN
INTRAMUSCULAR | Status: AC
  Filled 2020-10-22: qty 1

## 2020-10-22 MED ORDER — KETAMINE HCL 10 MG/ML IJ SOLN
INTRAMUSCULAR | Status: DC | PRN
  Administered 2020-10-22: 30 mg via INTRAVENOUS

## 2020-10-22 MED ORDER — OXYCODONE HCL 5 MG PO TABS: 5.0000 mg | ORAL_TABLET | Freq: Once | ORAL | Status: DC | PRN

## 2020-10-22 MED ORDER — ONDANSETRON HCL 4 MG/2ML IJ SOLN: 4.0000 mg | Freq: Once | INTRAMUSCULAR | Status: DC | PRN

## 2020-10-22 MED ORDER — SUGAMMADEX SODIUM 200 MG/2ML IV SOLN
INTRAVENOUS | Status: DC | PRN
  Administered 2020-10-22: 150 mg via INTRAVENOUS

## 2020-10-22 MED ORDER — ROCURONIUM BROMIDE 10 MG/ML (PF) SYRINGE
PREFILLED_SYRINGE | INTRAVENOUS | Status: DC | PRN
Start: 2020-10-22 — End: 2020-10-22
  Administered 2020-10-22: 20 mg via INTRAVENOUS
  Administered 2020-10-22: 60 mg via INTRAVENOUS
  Administered 2020-10-22: 10 mg via INTRAVENOUS

## 2020-10-22 MED ORDER — CEFAZOLIN SODIUM-DEXTROSE 2-4 GM/100ML-% IV SOLN
2.0000 g | INTRAVENOUS | Status: AC
  Administered 2020-10-22: 2 g via INTRAVENOUS
  Filled 2020-10-22: qty 100

## 2020-10-22 MED ORDER — FENTANYL CITRATE (PF) 100 MCG/2ML IJ SOLN
25.0000 ug | INTRAMUSCULAR | Status: DC | PRN
  Administered 2020-10-22: 25 ug via INTRAVENOUS

## 2020-10-22 MED ORDER — BUPIVACAINE HCL 0.25 % IJ SOLN
INTRAMUSCULAR | Status: DC | PRN
  Administered 2020-10-22: 20 mL

## 2020-10-22 MED ORDER — LIDOCAINE 2% (20 MG/ML) 5 ML SYRINGE
INTRAMUSCULAR | Status: DC | PRN
  Administered 2020-10-22: 60 mg via INTRAVENOUS

## 2020-10-22 MED ORDER — BUPIVACAINE HCL 0.25 % IJ SOLN
INTRAMUSCULAR | Status: AC
  Filled 2020-10-22: qty 1

## 2020-10-22 MED ORDER — PHENYLEPHRINE 40 MCG/ML (10ML) SYRINGE FOR IV PUSH (FOR BLOOD PRESSURE SUPPORT)
PREFILLED_SYRINGE | INTRAVENOUS | Status: DC | PRN
Start: 2020-10-22 — End: 2020-10-22
  Administered 2020-10-22 (×3): 200 ug via INTRAVENOUS

## 2020-10-22 MED ORDER — ONDANSETRON HCL 4 MG/2ML IJ SOLN
INTRAMUSCULAR | Status: DC | PRN
  Administered 2020-10-22: 4 mg via INTRAVENOUS

## 2020-10-22 MED ORDER — LACTATED RINGERS IV SOLN: INTRAVENOUS | Status: DC

## 2020-10-22 MED ORDER — AMISULPRIDE (ANTIEMETIC) 5 MG/2ML IV SOLN: 10.0000 mg | Freq: Once | INTRAVENOUS | Status: DC | PRN

## 2020-10-22 MED ORDER — PHENYLEPHRINE HCL-NACL 20-0.9 MG/250ML-% IV SOLN
INTRAVENOUS | Status: DC | PRN
  Administered 2020-10-22: 50 ug/min via INTRAVENOUS

## 2020-10-22 MED ORDER — DEXAMETHASONE SODIUM PHOSPHATE 4 MG/ML IJ SOLN: 4.0000 mg | INTRAMUSCULAR | Status: DC

## 2020-10-22 MED ORDER — DEXAMETHASONE SODIUM PHOSPHATE 10 MG/ML IJ SOLN
INTRAMUSCULAR | Status: DC | PRN
  Administered 2020-10-22: 5 mg via INTRAVENOUS

## 2020-10-22 MED ORDER — SODIUM CHLORIDE 0.9% FLUSH: 3.0000 mL | Freq: Two times a day (BID) | INTRAVENOUS | Status: DC

## 2020-10-22 MED ORDER — LACTATED RINGERS IV SOLN: INTRAVENOUS | Status: DC | PRN

## 2020-10-22 SURGICAL SUPPLY — 77 items
APPLICATOR SURGIFLO ENDO (HEMOSTASIS) IMPLANT
BAG COUNTER SPONGE SURGICOUNT (BAG) IMPLANT
BAG LAPAROSCOPIC 12 15 PORT 16 (BASKET) ×1 IMPLANT
BAG RETRIEVAL 12/15 (BASKET) ×2
BLADE SURG SZ10 CARB STEEL (BLADE) IMPLANT
CELLS DAT CNTRL 66122 CELL SVR (MISCELLANEOUS) IMPLANT
COVER BACK TABLE 60X90IN (DRAPES) ×2 IMPLANT
COVER TIP SHEARS 8 DVNC (MISCELLANEOUS) ×1 IMPLANT
COVER TIP SHEARS 8MM DA VINCI (MISCELLANEOUS) ×2
DECANTER SPIKE VIAL GLASS SM (MISCELLANEOUS) IMPLANT
DERMABOND ADVANCED (GAUZE/BANDAGES/DRESSINGS) ×1
DERMABOND ADVANCED .7 DNX12 (GAUZE/BANDAGES/DRESSINGS) ×1 IMPLANT
DRAPE ARM DVNC X/XI (DISPOSABLE) ×4 IMPLANT
DRAPE COLUMN DVNC XI (DISPOSABLE) ×1 IMPLANT
DRAPE DA VINCI XI ARM (DISPOSABLE) ×8
DRAPE DA VINCI XI COLUMN (DISPOSABLE) ×2
DRAPE SHEET LG 3/4 BI-LAMINATE (DRAPES) ×2 IMPLANT
DRAPE SURG IRRIG POUCH 19X23 (DRAPES) ×2 IMPLANT
DRSG OPSITE POSTOP 4X6 (GAUZE/BANDAGES/DRESSINGS) IMPLANT
DRSG OPSITE POSTOP 4X8 (GAUZE/BANDAGES/DRESSINGS) IMPLANT
ELECT PENCIL ROCKER SW 15FT (MISCELLANEOUS) IMPLANT
ELECT REM PT RETURN 15FT ADLT (MISCELLANEOUS) ×2 IMPLANT
GAUZE 4X4 16PLY ~~LOC~~+RFID DBL (SPONGE) ×2 IMPLANT
GLOVE SURG ENC MOIS LTX SZ6 (GLOVE) ×8 IMPLANT
GLOVE SURG ENC MOIS LTX SZ6.5 (GLOVE) ×4 IMPLANT
GOWN STRL REUS W/ TWL LRG LVL3 (GOWN DISPOSABLE) ×4 IMPLANT
GOWN STRL REUS W/TWL LRG LVL3 (GOWN DISPOSABLE) ×8
HOLDER FOLEY CATH W/STRAP (MISCELLANEOUS) IMPLANT
IRRIG SUCT STRYKERFLOW 2 WTIP (MISCELLANEOUS) ×2
IRRIGATION SUCT STRKRFLW 2 WTP (MISCELLANEOUS) ×1 IMPLANT
KIT PROCEDURE DA VINCI SI (MISCELLANEOUS)
KIT PROCEDURE DVNC SI (MISCELLANEOUS) IMPLANT
KIT TURNOVER KIT A (KITS) ×2 IMPLANT
MANIPULATOR ADVINCU DEL 3.5 PL (MISCELLANEOUS) ×2 IMPLANT
MANIPULATOR UTERINE 4.5 ZUMI (MISCELLANEOUS) ×2 IMPLANT
NEEDLE HYPO 22GX1.5 SAFETY (NEEDLE) ×2 IMPLANT
NEEDLE SPNL 18GX3.5 QUINCKE PK (NEEDLE) IMPLANT
OBTURATOR OPTICAL STANDARD 8MM (TROCAR) ×2
OBTURATOR OPTICAL STND 8 DVNC (TROCAR) ×1
OBTURATOR OPTICALSTD 8 DVNC (TROCAR) ×1 IMPLANT
PACK ROBOT GYN CUSTOM WL (TRAY / TRAY PROCEDURE) ×2 IMPLANT
PAD POSITIONING PINK XL (MISCELLANEOUS) ×2 IMPLANT
PORT ACCESS TROCAR AIRSEAL 12 (TROCAR) ×1 IMPLANT
PORT ACCESS TROCAR AIRSEAL 5M (TROCAR) ×1
POUCH SPECIMEN RETRIEVAL 10MM (ENDOMECHANICALS) ×4 IMPLANT
RETRACTOR WND ALEXIS 25 LRG (MISCELLANEOUS) IMPLANT
RTRCTR WOUND ALEXIS 18CM MED (MISCELLANEOUS)
RTRCTR WOUND ALEXIS 25CM LRG (MISCELLANEOUS)
SCRUB EXIDINE 4% CHG 4OZ (MISCELLANEOUS) ×2 IMPLANT
SEAL CANN UNIV 5-8 DVNC XI (MISCELLANEOUS) ×3 IMPLANT
SEAL XI 5MM-8MM UNIVERSAL (MISCELLANEOUS) ×6
SET TRI-LUMEN FLTR TB AIRSEAL (TUBING) ×2 IMPLANT
SOL ANTI FOG 6CC (MISCELLANEOUS) ×1 IMPLANT
SOLUTION ANTI FOG 6CC (MISCELLANEOUS) ×1
SPONGE T-LAP 18X18 ~~LOC~~+RFID (SPONGE) IMPLANT
SURGIFLO W/THROMBIN 8M KIT (HEMOSTASIS) IMPLANT
SUT MNCRL AB 4-0 PS2 18 (SUTURE) IMPLANT
SUT PDS AB 1 TP1 96 (SUTURE) IMPLANT
SUT STRAFIX SYMMETRIC 0-0 18 (SUTURE) ×2
SUT VIC AB 0 CT1 27 (SUTURE) ×2
SUT VIC AB 0 CT1 27XBRD ANTBC (SUTURE) ×1 IMPLANT
SUT VIC AB 2-0 CT1 27 (SUTURE)
SUT VIC AB 2-0 CT1 TAPERPNT 27 (SUTURE) IMPLANT
SUT VIC AB 4-0 PS2 18 (SUTURE) ×4 IMPLANT
SUT VLOC 180 0 9IN  GS21 (SUTURE) ×2
SUT VLOC 180 0 9IN GS21 (SUTURE) ×1 IMPLANT
SUTURE STRAFIX SYMMETRC 0-0 18 (SUTURE) ×1 IMPLANT
SYR 10ML LL (SYRINGE) IMPLANT
SYR 20ML LL LF (SYRINGE) IMPLANT
SYR 50ML LL SCALE MARK (SYRINGE) IMPLANT
TOWEL OR NON WOVEN STRL DISP B (DISPOSABLE) ×2 IMPLANT
TRAP SPECIMEN MUCUS 40CC (MISCELLANEOUS) IMPLANT
TRAY FOLEY MTR SLVR 16FR STAT (SET/KITS/TRAYS/PACK) ×2 IMPLANT
TROCAR XCEL NON-BLD 5MMX100MML (ENDOMECHANICALS) IMPLANT
UNDERPAD 30X36 HEAVY ABSORB (UNDERPADS AND DIAPERS) ×2 IMPLANT
WATER STERILE IRR 1000ML POUR (IV SOLUTION) ×2 IMPLANT
YANKAUER SUCT BULB TIP 10FT TU (MISCELLANEOUS) IMPLANT

## 2020-10-22 NOTE — H&P (Signed)
Follow-up Note: Gyn-Onc  Consult was initially requested by Dr. Stann Mainland for the evaluation of Nichole Walters 70 y.o. female  CC:  Fibroids, thickened endometrium. Endometrial hyperplasia. Bilateral ovarian cysts.    Assessment/Plan:  [name]  is a 70 y.o.  year old with bilateral ovarian cysts and uterine fibroids. She has a thickened endometrium and atypical cells on sampling.   I reviewed her [place] images from 08/30/20.   I am recommending proceeding with surgery with hysterectomy/BSO for pathology and definitive diagnosis and management. We will inject ICG to perform SLN mapping if frozen section on the uterus reveals malignancy. She is a good surgical candidate overall with a favorable risk profile for surgery. I discussed common risks of surgery including  bleeding, infection, damage to internal organs (such as bladder,ureters, bowels), blood clot, reoperation and rehospitalization. I explained that this would be a same-day discharge outpatient procedure and discussed anticipated recovery time and restrictions. Her short stature and abdominal fat distribution predict a challenging surgery.   Surgery is scheduled for 10/22/20.  HPI: [name] is a 70 year old P1 who was seen in consultation at the request of Dr Stann Mainland for a thickened endometrium (new finding) in the setting of fibroid uterus and bilateral ovarian cysts.  The patient reported that she has been known to have fibroids for many years. She had an episode of vaginal bleeding in 2017 which was managed with a hysteroscopy D&C and polypectomy for benign pathology. She had no bleeding since that time.  She underwent MRI spine in early 2020 for back pain. This detected ovarian cysts and she then underwent a TVUS on 05/12/18. This showed a uterus measuring 8.8x4.3x8cm with fibroids and an endometrial thickness of 19m. A fundal fibroid measured 1.7cm and a right lateral fibroid measured 3.8cm. The right ovary contained  a simple cyst measuring 4.8x3.1x3.1cm nd hte left ovary contained a simple cyst measuring 7.2x5x5.9cm.   An MRI was performed of the pelvis on 05/24/18 which showed am 8x4x6cm uterus with several uterine fibroids again noted including an exophytic fibroid projecting from the lower anterior segment on the left measuring 4.1cm. There was a 5cm simple cyst on the right and a 7.4cm left ovarian simple cyst. No ascites or peritoneal nodularity was seen.  Endometrial biopsy was performed on 5//18/20 and revealed simple hyperplasia without atypia. Pap was normal.   CA 125 was drawn on 08/05/18 and was normal at 8.2. She was offered either hysterectomy with BSO, BSO alone, or expectant management and she elected for the latter.  On 05/17/19 she underwent repeat TVUS which showed a uterus with measurements: 8.4 x 4.7 x 5.1 cm and multiple small (<3cm) fibroids. The endometrial thickness was 12 mm. There was a small endometrial cyst 4 x 3 x 4 mm. No additional masses. Large simple cyst LEFT adnexa, 7.4 x 6.2 x 5.2cm. Additional cyst in RIGHT adnexa 5.3 x 3.2 x 3.7 cm, with minimal dependent debris. No additional adnexal masses. These were shown to be likely arising from the ovaries on a prior MR and are grossly unchanged in size. Follow-up imaging recommended in 1 year to assess continued stability.  At that time she denied vaginal bleeding.   She was offered robotic assisted total hysterectomy and BSO, possible staging when she was initially evaluated, and was somewhat interested in surgery, but desired delaying the surgery until after June, 2021.  Interval Hx:  She returned to see Dr WStann Mainlandand a repeat [place] performed on  08/30/20. She denied symptoms of bleeding.  The ultrasound showed stable ovarian simple cysts bilaterally and a thickened endometrium of 62m. The uterus measured 9x4.3x5.7cm.  She denied symptoms of vaginal bleeding.   Endometrial biopsy on 09/18/20 showed atypical cells.   Current Meds:   Outpatient Encounter Medications as of 09/18/2020  Medication Sig   amLODipine (NORVASC) 5 MG tablet TAKE 1 TABLET BY MOUTH EVERY DAY   calcium carbonate (CALTRATE 600) 1500 (600 Ca) MG TABS tablet Take 600 mg of elemental calcium by mouth 2 (two) times daily with a meal.   cholecalciferol (VITAMIN D) 1000 units tablet Take 1,000 Units by mouth daily.   latanoprost (XALATAN) 0.005 % ophthalmic solution Place 1 drop into both eyes at bedtime.   OVER THE COUNTER MEDICATION Take 2 capsules by mouth 3 (three) times daily. Ligaplex II 5300 - herbal supplement   pravastatin (PRAVACHOL) 20 MG tablet TAKE 1 TABLET BY MOUTH EVERY DAY   valsartan-hydrochlorothiazide (DIOVAN-HCT) 160-12.5 MG tablet Take 1 tablet by mouth daily.   No facility-administered encounter medications on file as of 09/18/2020.    Allergy:  Allergies  Allergen Reactions   Celexa [Citalopram] Swelling    Mouth swells    Social Hx:   Social History   Socioeconomic History   Marital status: Married    Spouse name: Not on file   Number of children: Not on file   Years of education: Not on file   Highest education level: Not on file  Occupational History   Not on file  Tobacco Use   Smoking status: Never   Smokeless tobacco: Never  Vaping Use   Vaping Use: Never used  Substance and Sexual Activity   Alcohol use: No   Drug use: No   Sexual activity: Not Currently    Birth control/protection: Post-menopausal  Other Topics Concern   Not on file  Social History Narrative   Single mother of one, grandmother 347 great-grandmother 2.   Does not smoke, does not drink.   Exercises usually on treadmill mostly 3 days a week.   Social Determinants of Health   Financial Resource Strain: Not on file  Food Insecurity: Not on file  Transportation Needs: Not on file  Physical Activity: Not on file  Stress: Not on file  Social Connections: Not on file  Intimate Partner Violence: Not on file    Past Surgical Hx:  Past  Surgical History:  Procedure Laterality Date   CARDIAC CATHETERIZATION  04/25/2001   EF>60%   COLONOSCOPY     DOPPLER ECHOCARDIOGRAPHY  02/13/2009   EF>55%   EXCISION MASS NECK     HYSTEROSCOPY N/A 07/22/2015   Procedure: HYSTEROSCOPY;  Surgeon: RAlden Hipp MD;  [facility];  Service: Gynecology;  Laterality: N/A;   NM MYOVIEW LTD  02/13/2009   EF 70%  Low risk scan   NM PERSANTINE MYOVIEW LTD  01/30/2009   EF> 70%. No ischemia or infarction   TOE SURGERY     TRANSTHORACIC ECHOCARDIOGRAM  01/30/2009   Normal LV size and function. Mild MR and TR. Otherwise normal. Normal diastolic function.   WISDOM TOOTH EXTRACTION      Past Medical Hx:  Past Medical History:  Diagnosis Date   Anemia    Anxiety    no meds   COVID-19    2022   Dyslipidemia 05/08/2013   Essential hypertension 05/08/2013   Glaucoma    Palpitations 05/08/2013   Hx    SVD (spontaneous vaginal delivery)  x 1    Past Gynecological History:  SVD x 1 No LMP recorded. Patient is postmenopausal.  Family Hx:  Family History  Problem Relation Age of Onset   Colon cancer Sister    Colon cancer Brother    Colon cancer Cousin     Review of Systems:  Constitutional  Feels well,    ENT Normal appearing ears and nares bilaterally Skin/Breast  No rash, sores, jaundice, itching, dryness Cardiovascular  No chest pain, shortness of breath, or edema  Pulmonary  No cough or wheeze.  Gastro Intestinal  No nausea, vomitting, or diarrhoea. No bright red blood per rectum, no abdominal pain, change in bowel movement, or constipation.  Genito Urinary  No frequency, urgency, dysuria, no bleeding  Musculo Skeletal  No myalgia, arthralgia, joint swelling or pain  Neurologic  No weakness, numbness, change in gait,  Psychology  No depression, anxiety, insomnia.   Vitals:  Blood pressure 139/70, pulse 87, temperature 98.9 F (37.2 C), temperature source Oral, resp. rate 18, height 5' (1.524 m), weight 140  lb 10.5 oz (63.8 kg), SpO2 98 %.  Physical Exam: WD in NAD Neck  Supple NROM, without any enlargements.  Lymph Node Survey No cervical supraclavicular or inguinal adenopathy Cardiovascular  Well perfused peripheries Lungs  No increased WOB Skin  No rash/lesions/breakdown  Psychiatry  Alert and oriented to person, place, and time  Abdomen  Normoactive bowel sounds, abdomen soft, non-tender and nonobese without evidence of hernia.  Back No CVA tenderness Genito Urinary  Vulva/vagina: Normal external female genitalia.   No lesions. No discharge or bleeding.  Bladder/urethra:  No lesions or masses, well supported bladder  Vagina: normal  Cervix: Normal appearing, no lesions.  Uterus:  Bulky, mobile, no parametrial involvement or nodularity.   Adnexa: no discretely palpable masses. Rectal  Good tone, no masses no cul de sac nodularity.  Extremities  No bilateral cyanosis, clubbing or edema.    Thereasa Solo, MD  10/22/2020, 7:08 AM

## 2020-10-22 NOTE — Discharge Instructions (Signed)
Return to work: 4 weeks (2 weeks with physical restrictions).  Activity: 1. Be up and out of the bed during the day.  Take a nap if needed.  You may walk up steps but be careful and use the hand rail.  Stair climbing will tire you more than you think, you may need to stop part way and rest.   2. No lifting or straining for 4 weeks.  3. No driving for 1 weeks.  Do Not drive if you are taking narcotic pain medicine.  4. Shower daily.  Use soap and water on your incision and pat dry; don't rub.   5. No sexual activity and nothing in the vagina for 8 weeks.  Medications:  - Take ibuprofen and tylenol first line for pain control. Take these regularly (every 6 hours) to decrease the build up of pain.  - If necessary, for severe pain not relieved by ibuprofen, contact Dr Serita Grit office and you will be prescribed percocet.  - While taking percocet you should take sennakot every night to reduce the likelihood of constipation. If this causes diarrhea, stop its use.  Diet: 1. Low sodium Heart Healthy Diet is recommended.  2. It is safe to use a laxative if you have difficulty moving your bowels.   Wound Care: 1. Keep clean and dry.  Shower daily.  Reasons to call the Doctor:  Fever - Oral temperature greater than 100.4 degrees Fahrenheit Foul-smelling vaginal discharge Difficulty urinating Nausea and vomiting Increased pain at the site of the incision that is unrelieved with pain medicine. Difficulty breathing with or without chest pain New calf pain especially if only on one side Sudden, continuing increased vaginal bleeding with or without clots.   Follow-up: 1. See Everitt Amber in 4 weeks.  Contacts: For questions or concerns you should contact:  Dr. Everitt Amber at [phone] After hours and on week-ends call [phone] and ask to speak to the physician on call for Gynecologic Oncology

## 2020-10-22 NOTE — Op Note (Signed)
OPERATIVE NOTE 10/22/20  Surgeon: Donaciano Eva   Assistants: Dr Lahoma Crocker (an MD assistant was necessary for tissue manipulation, management of robotic instrumentation, retraction and positioning due to the complexity of the case and hospital policies).   Anesthesia: General endotracheal anesthesia  ASA Class: 2   Pre-operative Diagnosis: fibroids and thickened endometrium, ovarian cysts  Post-operative Diagnosis:  same, benign pathology  Operation: Robotic-assisted laparoscopic total hysterectomy with bilateral salpingoophorectomy   Surgeon: Donaciano Eva  Assistant Surgeon: Lahoma Crocker MD  Anesthesia: GET  Urine Output: 175cc  Operative Findings:  : 10cm fibroid uterus with anterior pedunculated fibroid, bilateral 10cm benign appearing cystic ovarian masses. Normal upper abdomen. No ascites, and normal diaphragm. Normal appendix.  Estimated Blood Loss:   25cc       Total IV Fluids: 800 ml         Specimens:  uterus with cervix; right tube and ovary; left tube and ovary; washings         Complications:  None; patient tolerated the procedure well.         Disposition: PACU - hemodynamically stable.  Procedure Details  The patient was seen in the Holding Room. The risks, benefits, complications, treatment options, and expected outcomes were discussed with the patient.  The patient concurred with the proposed plan, giving informed consent.  The site of surgery properly noted/marked. The patient was identified as Nichole Walters and the procedure verified as a Robotic-assisted hysterectomy with bilateral salpingo oophorectomy. A Time Out was held and the above information confirmed.  After induction of anesthesia, the patient was draped and prepped in the usual sterile manner. Pt was placed in supine position after anesthesia and draped and prepped in the usual sterile manner. The abdominal drape was placed after the CholoraPrep had been allowed to dry  for 3 minutes.  Her arms were tucked to her side with all appropriate precautions.  The shoulders were stabilized with padded shoulder blocks applied to the acromium processes.  The patient was placed in the semi-lithotomy position in [place].  The perineum was prepped with Betadine. The patient was then prepped. Foley catheter was placed.  A sterile speculum was placed in the vagina.  The cervix was grasped with a single-tooth tenaculum and dilated with Kennon Rounds dilators.  The cervix was infiltrated with a dilute solution of ICG for potential SLN mapping (should the endometrium prove to be malignant on frozen section). The ZUMI uterine manipulator with a medium colpotomizer ring was placed without difficulty.  A pneumoccluder balloon was placed over the manipulator.  OG tube placement was confirmed and to suction.   Next, a 5 mm skin incision was made 1 cm below the subcostal margin in the midclavicular line.  The 5 mm Optiview port and scope was used for direct entry.  Opening pressure was under 10 mm CO2.  The abdomen was insufflated and the findings were noted as above.   At this point and all points during the procedure, the patient's intra-abdominal pressure did not exceed 15 mmHg. Next, a 10 mm skin incision was made in the umbilicus and a right and left port was placed about 10 cm lateral to the robot port on the right and left side.  All ports were placed under direct visualization.  The patient was placed in steep Trendelenburg.  Bowel was folded away into the upper abdomen.  The robot was docked in the normal manner.  The hysterectomy was started after the round ligament on the  right side was incised and the retroperitoneum was entered and the pararectal space was developed.  The ureter was noted to be on the medial leaf of the broad ligament.  The peritoneum above the ureter was incised and stretched and the infundibulopelvic ligament was skeletonized, cauterized and cut.  The posterior  peritoneum was taken down to the level of the KOH ring.  The right adnexa was separated from the cornua and the right tube and ovary was placed in an endocatch bag for vaginal retrieval.The anterior peritoneum was also taken down.  The bladder flap was created to the level of the KOH ring.  The uterine artery on the right side was skeletonized, cauterized and cut in the normal manner.  A similar procedure was performed on the left.  The colpotomy was made and the uterus, cervix, bilateral ovaries and tubes were amputated and delivered through the vagina.  Pedicles were inspected and excellent hemostasis was achieved.    The colpotomy at the vaginal cuff was closed with 0-pds barbed suture on a CT1 needle in a running manner.  Irrigation was used and excellent hemostasis was achieved.  At this point in the procedure was completed.  Robotic instruments were removed under direct visulaization.  The robot was undocked. The 10 mm ports were closed with Vicryl on a UR-5 needle and the fascia was closed with 0 vicrylon a UR-5 needle.  The skin was closed with 4-0 Vicryl in a subcuticular manner.  Dermabond was applied.  Sponge, lap and needle counts correct x 2.  The patient was taken to the recovery room in stable condition.  The vagina was swabbed with  minimal bleeding noted.   All instrument and needle counts were correct x  3.   The patient was transferred to the recovery room in stable condition.  Donaciano Eva, MD

## 2020-10-22 NOTE — Anesthesia Procedure Notes (Signed)
Procedure Name: Intubation Date/Time: 10/22/2020 8:53 AM Performed by: Rosaland Lao, CRNA Pre-anesthesia Checklist: Patient identified, Emergency Drugs available, Suction available and Patient being monitored Patient Re-evaluated:Patient Re-evaluated prior to induction Oxygen Delivery Method: Circle system utilized Preoxygenation: Pre-oxygenation with 100% oxygen Induction Type: IV induction Ventilation: Mask ventilation without difficulty Laryngoscope Size: Miller and 2 Grade View: Grade II Tube type: Oral Tube size: 7.0 mm Number of attempts: 1 Airway Equipment and Method: Stylet Placement Confirmation: ETT inserted through vocal cords under direct vision, positive ETCO2 and breath sounds checked- equal and bilateral Secured at: 22 cm Tube secured with: Tape Dental Injury: Teeth and Oropharynx as per pre-operative assessment

## 2020-10-22 NOTE — Anesthesia Postprocedure Evaluation (Signed)
Anesthesia Post Note  Patient: Soriya Ashlin  Procedure(s) Performed: XI ROBOTIC ASSISTED TOTAL HYSTERECTOMY WITH BILATERAL SALPINGO OOPHORECTOMY (Uterus)     Patient location during evaluation: PACU Anesthesia Type: General Level of consciousness: awake and alert Pain management: pain level controlled Vital Signs Assessment: post-procedure vital signs reviewed and stable Respiratory status: spontaneous breathing, nonlabored ventilation and respiratory function stable Cardiovascular status: blood pressure returned to baseline and stable Postop Assessment: no apparent nausea or vomiting Anesthetic complications: no   No notable events documented.  Last Vitals:  Vitals:   10/22/20 1145 10/22/20 1200  BP: 118/65 128/61  Pulse: 66 64  Resp: 15 16  Temp: (!) 36.3 C   SpO2: 100% 98%    Last Pain:  Vitals:   10/22/20 1145  TempSrc:   PainSc: 5                  Lidia Collum

## 2020-10-22 NOTE — Transfer of Care (Signed)
Immediate Anesthesia Transfer of Care Note  Patient: Nichole Walters  Procedure(s) Performed: XI ROBOTIC ASSISTED TOTAL HYSTERECTOMY WITH BILATERAL SALPINGO OOPHORECTOMY (Uterus)  Patient Location: PACU  Anesthesia Type:General  Level of Consciousness: awake, alert  and oriented  Airway & Oxygen Therapy: Patient Spontanous Breathing and Patient connected to face mask  Post-op Assessment: Report given to RN and Post -op Vital signs reviewed and stable  Post vital signs: Reviewed and stable  Last Vitals:  Vitals Value Taken Time  BP 136/70 10/22/20 1108  Temp    Pulse 65 10/22/20 1111  Resp 17 10/22/20 1111  SpO2 100 % 10/22/20 1111  Vitals shown include unvalidated device data.  Last Pain:  Vitals:   10/22/20 0704  TempSrc:   PainSc: 0-No pain         Complications: No notable events documented.

## 2020-10-23 ENCOUNTER — Telehealth: Payer: Self-pay

## 2020-10-23 ENCOUNTER — Encounter (HOSPITAL_COMMUNITY): Payer: Self-pay | Admitting: Gynecologic Oncology

## 2020-10-23 NOTE — Telephone Encounter (Signed)
Spoke with [name]. She states she is eating, drinking and urinating well. She has not had a BM yet and has not been passing gas. She is taking senokot as prescribed and encouraged her to drink plenty of water.  Told her to increase the senokot-s to 2 tabs bid with 8 oz of water. If no BM by Friday 10-25-20 she can add a capful of Miralax bid. She denies fever or chills. Incisions are dry and intact. Her pain is controlled with Ibuprofen.  Instructed to call office with any fever, chills, purulent drainage, uncontrolled pain or any other questions or concerns. Patient verbalizes understanding.  Pt aware of post op appointments as well as the office number [phone] and after hours number [phone] to call if she has any questions or concerns

## 2020-10-24 LAB — CYTOLOGY - NON PAP

## 2020-10-24 LAB — SURGICAL PATHOLOGY

## 2020-10-25 ENCOUNTER — Telehealth: Payer: Self-pay

## 2020-10-25 NOTE — Telephone Encounter (Signed)
Told Nichole Walters that the benign cysts were seen in both ovaries.  There was a benign polyp in the endometrium.  No cancer and no further intervention is needed per [name], NP. Pt  verbalized understanding.

## 2020-11-02 ENCOUNTER — Other Ambulatory Visit: Payer: Self-pay | Admitting: Cardiology

## 2020-11-12 NOTE — Progress Notes (Signed)
Follow-up Note: Gyn-Onc  Consult was initially requested by Dr. Stann Mainland for the evaluation of Nichole Walters 70 y.o. female  CC:  Chief Complaint  Patient presents with   Endometrial thickening on ultrasound   Bilateral ovarian cysts    Assessment/Plan:  [name]  is a 70 y.o.  year old who is s/p robotic total hysterectomy with BSO on 10/22/20.   Benign pathology was identified surgically. She is doing well postoperatively.  Recommend follow-up with her PCP and Dr Stann Mainland for ongoing routine care.   HPI: [name] is a 70 year old P1 who was seen in consultation at the request of Dr Stann Mainland for a thickened endometrium (new finding) in the setting of fibroid uterus and bilateral ovarian cysts.  The patient reported that she has been known to have fibroids for many years. She had an episode of vaginal bleeding in 2017 which was managed with a hysteroscopy D&C and polypectomy for benign pathology. She had no bleeding since that time.  She underwent MRI spine in early 2020 for back pain. This detected ovarian cysts and she then underwent a TVUS on 05/12/18. This showed a uterus measuring 8.8x4.3x8cm with fibroids and an endometrial thickness of 53m. A fundal fibroid measured 1.7cm and a right lateral fibroid measured 3.8cm. The right ovary contained a simple cyst measuring 4.8x3.1x3.1cm nd hte left ovary contained a simple cyst measuring 7.2x5x5.9cm.   An MRI was performed of the pelvis on 05/24/18 which showed am 8x4x6cm uterus with several uterine fibroids again noted including an exophytic fibroid projecting from the lower anterior segment on the left measuring 4.1cm. There was a 5cm simple cyst on the right and a 7.4cm left ovarian simple cyst. No ascites or peritoneal nodularity was seen.  Endometrial biopsy was performed on 5//18/20 and revealed simple hyperplasia without atypia. Pap was normal.   CA 125 was drawn on 08/05/18 and was normal at 8.2. She was offered either  hysterectomy with BSO, BSO alone, or expectant management and she elected for the latter.  On 05/17/19 she underwent repeat TVUS which showed a uterus with measurements: 8.4 x 4.7 x 5.1 cm and multiple small (<3cm) fibroids. The endometrial thickness was 12 mm. There was a small endometrial cyst 4 x 3 x 4 mm. No additional masses. Large simple cyst LEFT adnexa, 7.4 x 6.2 x 5.2cm. Additional cyst in RIGHT adnexa 5.3 x 3.2 x 3.7 cm, with minimal dependent debris. No additional adnexal masses. These were shown to be likely arising from the ovaries on a prior MR and are grossly unchanged in size. Follow-up imaging recommended in 1 year to assess continued stability.  At that time she denied vaginal bleeding.   She was offered robotic assisted total hysterectomy and BSO, possible staging when she was initially evaluated, and was somewhat interested in surgery, but desired delaying the surgery until after June, 2021.  She returned to see Dr WStann Mainlandand a repeat [place] performed on 08/30/20. She denied symptoms of bleeding.  The ultrasound showed stable ovarian simple cysts bilaterally and a thickened endometrium of 13m The uterus measured 9x4.3x5.7cm.  She denied symptoms of vaginal bleeding.   Interval Hx:   On 10/22/20 she underwent robotic assisted total hysterectomy with BSO. Intraoperative findings were significant for a 10cm fibroid uterus with bilateral 10cm benign appearing cystic ovarian masses. Surgery was uncomplicated, with frozen section revealing benign pathology.  Final pathology revealed bilateral serous cystadenomas with no invasive malignancy.  Since surgery she has  done well with no complaints.   Current Meds:  Outpatient Encounter Medications as of 11/13/2020  Medication Sig   amLODipine (NORVASC) 5 MG tablet TAKE 1 TABLET BY MOUTH EVERY DAY   calcium carbonate (CALTRATE 600) 1500 (600 Ca) MG TABS tablet Take 600 mg of elemental calcium by mouth 2 (two) times daily with a meal.    latanoprost (XALATAN) 0.005 % ophthalmic solution Place 1 drop into both eyes at bedtime.   OVER THE COUNTER MEDICATION Take 2 capsules by mouth 3 (three) times daily. Ligaplex II 5300 - herbal supplement   pravastatin (PRAVACHOL) 20 MG tablet TAKE 1 TABLET BY MOUTH EVERY DAY   valsartan-hydrochlorothiazide (DIOVAN-HCT) 160-12.5 MG tablet Take 1 tablet by mouth daily.   ibuprofen (ADVIL) 600 MG tablet Take 1 tablet (600 mg total) by mouth every 6 (six) hours as needed for moderate pain. For AFTER surgery only (Patient not taking: Reported on 11/13/2020)   senna-docusate (SENOKOT-S) 8.6-50 MG tablet Take 2 tablets by mouth at bedtime. For AFTER surgery, do not take if having diarrhea (Patient not taking: Reported on 11/13/2020)   traMADol (ULTRAM) 50 MG tablet Take 1 tablet (50 mg total) by mouth every 6 (six) hours as needed for severe pain. For AFTER surgery only, do not take and drive (Patient not taking: Reported on 11/13/2020)   No facility-administered encounter medications on file as of 11/13/2020.    Allergy:  Allergies  Allergen Reactions   Celexa [Citalopram] Swelling    Mouth swells    Social Hx:   Social History   Socioeconomic History   Marital status: Married    Spouse name: Not on file   Number of children: Not on file   Years of education: Not on file   Highest education level: Not on file  Occupational History   Not on file  Tobacco Use   Smoking status: Never   Smokeless tobacco: Never  Vaping Use   Vaping Use: Never used  Substance and Sexual Activity   Alcohol use: No   Drug use: No   Sexual activity: Not Currently    Birth control/protection: Post-menopausal  Other Topics Concern   Not on file  Social History Narrative   Single mother of one, grandmother 13, great-grandmother 2.   Does not smoke, does not drink.   Exercises usually on treadmill mostly 3 days a week.   Social Determinants of Health   Financial Resource Strain: Not on file  Food  Insecurity: Not on file  Transportation Needs: Not on file  Physical Activity: Not on file  Stress: Not on file  Social Connections: Not on file  Intimate Partner Violence: Not on file    Past Surgical Hx:  Past Surgical History:  Procedure Laterality Date   CARDIAC CATHETERIZATION  04/25/2001   EF>60%   COLONOSCOPY     DOPPLER ECHOCARDIOGRAPHY  02/13/2009   EF>55%   EXCISION MASS NECK     HYSTEROSCOPY N/A 07/22/2015   Procedure: HYSTEROSCOPY;  Surgeon: Alden Hipp, MD;  Location: [facility];  Service: Gynecology;  Laterality: N/A;   NM MYOVIEW LTD  02/13/2009   EF 70%  Low risk scan   NM PERSANTINE MYOVIEW LTD  01/30/2009   EF> 70%. No ischemia or infarction   ROBOTIC ASSISTED TOTAL HYSTERECTOMY WITH BILATERAL SALPINGO OOPHERECTOMY N/A 10/22/2020   Procedure: XI ROBOTIC ASSISTED TOTAL HYSTERECTOMY WITH BILATERAL SALPINGO OOPHORECTOMY;  Surgeon: Everitt Amber, MD;  Location: [facility];  Service: Gynecology;  Laterality: N/A;   TOE SURGERY  TRANSTHORACIC ECHOCARDIOGRAM  01/30/2009   Normal LV size and function. Mild MR and TR. Otherwise normal. Normal diastolic function.   WISDOM TOOTH EXTRACTION      Past Medical Hx:  Past Medical History:  Diagnosis Date   Anemia    Anxiety    no meds   COVID-19    2022   Dyslipidemia 05/08/2013   Essential hypertension 05/08/2013   Glaucoma    Palpitations 05/08/2013   Hx    SVD (spontaneous vaginal delivery)    x 1    Past Gynecological History:  SVD x 1 No LMP recorded. Patient is postmenopausal.  Family Hx:  Family History  Problem Relation Age of Onset   Colon cancer Sister    Colon cancer Brother    Colon cancer Cousin     Review of Systems:  Constitutional  Feels well,    ENT Normal appearing ears and nares bilaterally Skin/Breast  No rash, sores, jaundice, itching, dryness Cardiovascular  No chest pain, shortness of breath, or edema  Pulmonary  No cough or wheeze.  Gastro Intestinal  No nausea, vomitting, or  diarrhoea. No bright red blood per rectum, no abdominal pain, change in bowel movement, or constipation.  Genito Urinary  No frequency, urgency, dysuria, no bleeding  Musculo Skeletal  No myalgia, arthralgia, joint swelling or pain  Neurologic  No weakness, numbness, change in gait,  Psychology  No depression, anxiety, insomnia.   Vitals:  Blood pressure 113/64, pulse 94, temperature (!) 97.5 F (36.4 C), temperature source Tympanic, resp. rate 18, height 5' (1.524 m), weight 139 lb 12.8 oz (63.4 kg), SpO2 100 %.  Physical Exam: WD in NAD Neck  Supple NROM, without any enlargements.  Lymph Node Survey No cervical supraclavicular or inguinal adenopathy Cardiovascular  Well perfused peripheries Lungs  No increased WOB Skin  No rash/lesions/breakdown  Psychiatry  Alert and oriented to person, place, and time  Abdomen  Normoactive bowel sounds, abdomen soft, non-tender and nonobese without evidence of hernia. Incisions well healed.  Back No CVA tenderness Genito Urinary  Vulva/vagina: there is thin, watery old blood emanating from between the sutures with an in tact vagina and no tenderness or signs of cellulitis.  Rectal  deferred Extremities  No bilateral cyanosis, clubbing or edema.   Thereasa Solo, MD  11/13/2020, 3:51 PM

## 2020-11-13 ENCOUNTER — Encounter: Payer: Self-pay | Admitting: Gynecologic Oncology

## 2020-11-13 ENCOUNTER — Telehealth: Payer: Self-pay | Admitting: Family Medicine

## 2020-11-13 ENCOUNTER — Inpatient Hospital Stay: Payer: Medicare Other | Attending: Gynecologic Oncology | Admitting: Gynecologic Oncology

## 2020-11-13 ENCOUNTER — Other Ambulatory Visit: Payer: Self-pay

## 2020-11-13 VITALS — BP 113/64 | HR 94 | Temp 97.5°F | Resp 18 | Ht 60.0 in | Wt 139.8 lb

## 2020-11-13 DIAGNOSIS — N83202 Unspecified ovarian cyst, left side: Secondary | ICD-10-CM | POA: Diagnosis present

## 2020-11-13 DIAGNOSIS — R9389 Abnormal findings on diagnostic imaging of other specified body structures: Secondary | ICD-10-CM

## 2020-11-13 DIAGNOSIS — N83201 Unspecified ovarian cyst, right side: Secondary | ICD-10-CM | POA: Diagnosis not present

## 2020-11-13 DIAGNOSIS — Z90710 Acquired absence of both cervix and uterus: Secondary | ICD-10-CM

## 2020-11-13 DIAGNOSIS — Z90722 Acquired absence of ovaries, bilateral: Secondary | ICD-10-CM

## 2020-11-13 NOTE — Patient Instructions (Signed)
The surgery showed no cancer in your ovaries.  You no longer require pap smears as your cervix was removed.  Dr Denman George recommends no sex for another 5 weeks. She recommends no heavy lifting for 1 more week.  It is safe to swim and bathe.

## 2020-11-13 NOTE — Telephone Encounter (Signed)
Left message for patient to call back and schedule Medicare Annual Wellness Visit (AWV) either virtually or in office. Left  my Herbie Drape number [phone]   Last AWV 12/28/2018 please schedule at anytime with [facility] Nurse Health Advisor 1 or 2   This should be a 45 minute visit.

## 2020-11-30 ENCOUNTER — Other Ambulatory Visit: Payer: Self-pay | Admitting: Cardiology

## 2020-12-19 ENCOUNTER — Other Ambulatory Visit: Payer: Self-pay | Admitting: Cardiology

## 2020-12-26 ENCOUNTER — Other Ambulatory Visit: Payer: Self-pay | Admitting: Cardiology

## 2021-01-01 ENCOUNTER — Encounter: Payer: Self-pay | Admitting: Family Medicine

## 2021-01-01 ENCOUNTER — Ambulatory Visit (INDEPENDENT_AMBULATORY_CARE_PROVIDER_SITE_OTHER): Payer: Medicare Other | Admitting: Family Medicine

## 2021-01-01 ENCOUNTER — Other Ambulatory Visit: Payer: Self-pay

## 2021-01-01 VITALS — BP 124/70 | HR 70 | Resp 16 | Ht 60.0 in | Wt 146.0 lb

## 2021-01-01 DIAGNOSIS — E785 Hyperlipidemia, unspecified: Secondary | ICD-10-CM | POA: Diagnosis not present

## 2021-01-01 DIAGNOSIS — I739 Peripheral vascular disease, unspecified: Secondary | ICD-10-CM | POA: Diagnosis not present

## 2021-01-01 DIAGNOSIS — E559 Vitamin D deficiency, unspecified: Secondary | ICD-10-CM | POA: Diagnosis not present

## 2021-01-01 DIAGNOSIS — Z1211 Encounter for screening for malignant neoplasm of colon: Secondary | ICD-10-CM | POA: Diagnosis not present

## 2021-01-01 DIAGNOSIS — Z0000 Encounter for general adult medical examination without abnormal findings: Secondary | ICD-10-CM | POA: Diagnosis not present

## 2021-01-01 DIAGNOSIS — I10 Essential (primary) hypertension: Secondary | ICD-10-CM | POA: Diagnosis not present

## 2021-01-01 LAB — BASIC METABOLIC PANEL
BUN: 10 mg/dL (ref 6–23)
CO2: 30 mEq/L (ref 19–32)
Calcium: 9.2 mg/dL (ref 8.4–10.5)
Chloride: 103 mEq/L (ref 96–112)
Creatinine, Ser: 0.63 mg/dL (ref 0.40–1.20)
GFR: 91.21 mL/min (ref >60.00)
Glucose, Bld: 75 mg/dL (ref 70–99)
Potassium: 3.6 mEq/L (ref 3.5–5.1)
Sodium: 139 mEq/L (ref 135–145)

## 2021-01-01 LAB — LIPID PANEL
Cholesterol: 182 mg/dL (ref 0–200)
HDL: 46.9 mg/dL
LDL Cholesterol: 114 mg/dL — ABNORMAL HIGH (ref 0–99)
NonHDL: 135.54
Total CHOL/HDL Ratio: 4
Triglycerides: 108 mg/dL (ref 0.0–149.0)
VLDL: 21.6 mg/dL (ref 0.0–40.0)

## 2021-01-01 LAB — VITAMIN D 25 HYDROXY (VIT D DEFICIENCY, FRACTURES): VITD: 25.26 ng/mL — ABNORMAL LOW (ref 30.00–100.00)

## 2021-01-01 NOTE — Progress Notes (Signed)
HPI: Nichole Walters is a 70 y.o. female, who is here today for her routine physical and follow up.  Last CPE: 12/29/19  Regular exercise 3 or more time per week: [employer] records at least 7000 steps per day in average. Following a healthful diet: She acknowledges she needs to decrease sweets. She has gained some wt. She is trying to increase vegetables and fruit intake.  She lives with her husband,got married in 03/2020. Sleeping better, 5-6 hours.  Reporting that she had labs through life line screening, normal otherwise.  Chronic medical problems: HTN,vit D def,lumbar radiculopathy,HLD,and PAD among some.  Immunization History  Administered Date(s) Administered   Hep A / Hep B 03/09/2017, 04/12/2017, 09/06/2017   PFIZER Comirnaty(Gray Top)Covid-19 Tri-Sucrose Vaccine 04/24/2019, 05/15/2019, 12/04/2019   Pneumococcal Conjugate-13 09/06/2017   Pneumococcal Polysaccharide-23 12/28/2018   Tdap 07/17/2016   Underwent hysterectomy and bilateral salpingo-oophorectomy on 10/22/20. She follows with gyn regularly.  Mammogram: 07/31/20 Colonoscopy: 08/2015, 5 years f/u recommended. FHx for colon cancer, brother (60 yo) and sister (3 yo). DEXA: 01/2019 osteopenia  Major Osteoporotic Fracture: 5.4% Hip Fracture: 1.0%  Hep C screening: 02/22/17 NR.  Follow up and concerns today. Vit D insufficiency: She is on Ca++ with vit D.  Hypertension:  Medications:Amlodipine 5 mg daily and Valsartan-HCTZ 160-12.5 mg daily. BP readings at home:110's-120's/70's. Side effects:None. Non rheumatologic mitral prolapse:She has not seen her cardiologist in over a year.  Negative for unusual or severe headache, visual changes, exertional chest pain, dyspnea,  focal weakness, or edema. Lab Results  Component Value Date   CREATININE 0.69 10/15/2020   BUN 13 10/15/2020   NA 136 10/15/2020   K 3.6 10/15/2020   CL 102 10/15/2020   CO2 28 10/15/2020   HLD: She is on Pravastatin 20 mg  daily. Component     Latest Ref Rng & Units 12/29/2019  Cholesterol     0 - 200 mg/dL 182  HDL Cholesterol     >39.00 mg/dL 51  Triglycerides     0.0 - 149.0 mg/dL 94  LDL Cholesterol (Calc)     mg/dL (calc) 111 (H)  Total CHOL/HDL Ratio      3.6  Non-HDL Cholesterol (Calc)     <130 mg/dL (calc) 131 (H)   PAD: ABI 08/2020 right toe-brachial index mildly abnormal.  Review of Systems  Constitutional:  Negative for appetite change, fatigue and fever.  HENT:  Negative for hearing loss, mouth sores, sore throat and trouble swallowing.   Eyes:  Negative for redness and visual disturbance.  Respiratory:  Negative for cough and wheezing.   Cardiovascular:  Negative for chest pain and palpitations.  Gastrointestinal:  Negative for abdominal pain, nausea and vomiting.       No changes in bowel habits.  Endocrine: Negative for cold intolerance, heat intolerance, polydipsia, polyphagia and polyuria.  Genitourinary:  Negative for decreased urine volume, dysuria, hematuria, vaginal bleeding and vaginal discharge.  Musculoskeletal:  Positive for arthralgias. Negative for gait problem and myalgias.  Skin:  Negative for color change and rash.  Allergic/Immunologic: Negative for environmental allergies.  Neurological:  Negative for syncope, weakness and headaches.  Hematological:  Negative for adenopathy. Does not bruise/bleed easily.  Psychiatric/Behavioral:  Negative for confusion.   All other systems reviewed and are negative.  Current Outpatient Medications on File Prior to Visit  Medication Sig Dispense Refill   amLODipine (NORVASC) 5 MG tablet TAKE 1 TABLET BY MOUTH EVERY DAY 30 tablet 0   calcium carbonate (CALTRATE  600) 1500 (600 Ca) MG TABS tablet Take 600 mg of elemental calcium by mouth 2 (two) times daily with a meal.     ibuprofen (ADVIL) 600 MG tablet Take 1 tablet (600 mg total) by mouth every 6 (six) hours as needed for moderate pain. For AFTER surgery only 30 tablet 0    latanoprost (XALATAN) 0.005 % ophthalmic solution Place 1 drop into both eyes at bedtime.     OVER THE COUNTER MEDICATION Take 2 capsules by mouth 3 (three) times daily. Ligaplex II 5300 - herbal supplement     pravastatin (PRAVACHOL) 20 MG tablet TAKE 1 TABLET BY MOUTH EVERY DAY 90 tablet 1   senna-docusate (SENOKOT-S) 8.6-50 MG tablet Take 2 tablets by mouth at bedtime. For AFTER surgery, do not take if having diarrhea 30 tablet 0   valsartan-hydrochlorothiazide (DIOVAN-HCT) 160-12.5 MG tablet TAKE 1 TABLET BY MOUTH EVERY DAY 30 tablet 0   No current facility-administered medications on file prior to visit.   Past Medical History:  Diagnosis Date   Anemia    Anxiety    no meds   COVID-19    2022   Dyslipidemia 05/08/2013   Essential hypertension 05/08/2013   Glaucoma    Palpitations 05/08/2013   Hx    SVD (spontaneous vaginal delivery)    x 1   Past Surgical History:  Procedure Laterality Date   CARDIAC CATHETERIZATION  04/25/2001   EF>60%   COLONOSCOPY     DOPPLER ECHOCARDIOGRAPHY  02/13/2009   EF>55%   EXCISION MASS NECK     HYSTEROSCOPY N/A 07/22/2015   Procedure: HYSTEROSCOPY;  Surgeon: Alden Hipp, MD;  Location: [facility];  Service: Gynecology;  Laterality: N/A;   NM MYOVIEW LTD  02/13/2009   EF 70%  Low risk scan   NM PERSANTINE MYOVIEW LTD  01/30/2009   EF> 70%. No ischemia or infarction   ROBOTIC ASSISTED TOTAL HYSTERECTOMY WITH BILATERAL SALPINGO OOPHERECTOMY N/A 10/22/2020   Procedure: XI ROBOTIC ASSISTED TOTAL HYSTERECTOMY WITH BILATERAL SALPINGO OOPHORECTOMY;  Surgeon: Everitt Amber, MD;  Location: [facility];  Service: Gynecology;  Laterality: N/A;   TOE SURGERY     TRANSTHORACIC ECHOCARDIOGRAM  01/30/2009   Normal LV size and function. Mild MR and TR. Otherwise normal. Normal diastolic function.   WISDOM TOOTH EXTRACTION     Allergies  Allergen Reactions   Celexa [Citalopram] Swelling    Mouth swells    Family History  Problem Relation Age of Onset    Colon cancer Sister    Colon cancer Brother    Colon cancer Cousin    Social History   Socioeconomic History   Marital status: Married    Spouse name: Not on file   Number of children: Not on file   Years of education: Not on file   Highest education level: Not on file  Occupational History   Not on file  Tobacco Use   Smoking status: Never   Smokeless tobacco: Never  Vaping Use   Vaping Use: Never used  Substance and Sexual Activity   Alcohol use: No   Drug use: No   Sexual activity: Not Currently    Birth control/protection: Post-menopausal  Other Topics Concern   Not on file  Social History Narrative   Single mother of one, grandmother 33, great-grandmother 2.   Does not smoke, does not drink.   Exercises usually on treadmill mostly 3 days a week.   Social Determinants of Health   Financial Resource Strain: Not on file  Food Insecurity: Not on file  Transportation Needs: Not on file  Physical Activity: Not on file  Stress: Not on file  Social Connections: Not on file   Vitals:   01/01/21 1401  BP: 124/70  Pulse: 70  Resp: 16  SpO2: 97%   Body mass index is 28.51 kg/m.  Wt Readings from Last 3 Encounters:  01/01/21 146 lb (66.2 kg)  11/13/20 139 lb 12.8 oz (63.4 kg)  10/22/20 140 lb 10.5 oz (63.8 kg)   Physical Exam Vitals and nursing note reviewed.  Constitutional:      General: She is not in acute distress.    Appearance: She is well-developed.  HENT:     Head: Normocephalic and atraumatic.     Right Ear: Hearing, tympanic membrane, ear canal and external ear normal.     Left Ear: Hearing, tympanic membrane, ear canal and external ear normal.     Mouth/Throat:     Mouth: Mucous membranes are moist.     Pharynx: Oropharynx is clear. Uvula midline.  Eyes:     Extraocular Movements: Extraocular movements intact.     Conjunctiva/sclera: Conjunctivae normal.     Pupils: Pupils are equal, round, and reactive to light.  Neck:     Thyroid: No  thyromegaly.     Trachea: No tracheal deviation.  Cardiovascular:     Rate and Rhythm: Normal rate and regular rhythm.     Pulses:          Dorsalis pedis pulses are 2+ on the right side and 2+ on the left side.     Heart sounds: No murmur heard. Pulmonary:     Effort: Pulmonary effort is normal. No respiratory distress.     Breath sounds: Normal breath sounds.  Abdominal:     Palpations: Abdomen is soft. There is no hepatomegaly or mass.     Tenderness: There is no abdominal tenderness.  Genitourinary:    Comments: Deferred to gyn. Musculoskeletal:     Comments: No signs of synovitis appreciated.  Lymphadenopathy:     Cervical: No cervical adenopathy.     Upper Body:     Right upper body: No supraclavicular adenopathy.     Left upper body: No supraclavicular adenopathy.  Skin:    General: Skin is warm.     Findings: No erythema or rash.  Neurological:     General: No focal deficit present.     Mental Status: She is alert and oriented to person, place, and time.     Cranial Nerves: No cranial nerve deficit.     Coordination: Coordination normal.     Gait: Gait normal.     Deep Tendon Reflexes:     Reflex Scores:      Bicep reflexes are 2+ on the right side and 2+ on the left side.      Patellar reflexes are 2+ on the right side and 2+ on the left side. Psychiatric:        Speech: Speech normal.     Comments: Well groomed, good eye contact.   ASSESSMENT AND PLAN:  Ms. Karlin Binion was here today annual physical examination.  Orders Placed This Encounter  Procedures   Lipid panel   Basic metabolic panel   VITAMIN D 25 Hydroxy (Vit-D Deficiency, Fractures)   Ambulatory referral to Gastroenterology   Lab Results  Component Value Date   CREATININE 0.63 01/01/2021   BUN 10 01/01/2021   NA 139 01/01/2021   K 3.6 01/01/2021  CL 103 01/01/2021   CO2 30 01/01/2021   Lab Results  Component Value Date   CHOL 182 01/01/2021   HDL 46.90 01/01/2021   LDLCALC 114  (H) 01/01/2021   TRIG 108.0 01/01/2021   CHOLHDL 4 01/01/2021   Routine general medical examination at a health care facility We discussed the importance of regular physical activity and healthy diet for prevention of chronic illness and/or complications. Preventive guidelines reviewed. Vaccination up to date. Ca++ and vit D supplementation to continue. Next CPE in a year.  The 10-year ASCVD risk score (Arnett DK, et al., 2019) is: 9.3%   Values used to calculate the score:     Age: 19 years     Sex: Female     Is Non-Hispanic African American: Yes     Diabetic: No     Tobacco smoker: No     Systolic Blood Pressure: 916 mmHg     Is BP treated: Yes     HDL Cholesterol: 46.9 mg/dL     Total Cholesterol: 182 mg/dL  PAD (peripheral artery disease) (HCC) Continue Pravastatin 20 mg daily. Aspirin 81 mg daily recommended,EC.Some side effects discussed.  Essential hypertension BP adequately controlled. Continue Amlodipine and Valsartan-HCTZ same dose. Low salt diet recommended.  Vitamin D deficiency, unspecified Continue same dose of vit D supplementation, will adjust dose according to 25 OH vit D result.  Dyslipidemia Continue Pravastatin 20 mg daily and low fat diet. Further recommendations according to FLP results.  Colon cancer screening -     Ambulatory referral to Gastroenterology  Return in 6 months (on 07/01/2021).  Melinda Pottinger G. Martinique, MD  [facility]. [place] office.

## 2021-01-01 NOTE — Patient Instructions (Addendum)
A few things to remember from today's visit:  Routine general medical examination at a health care facility  PAD (peripheral artery disease) ([place]) - Plan: Lipid panel  Essential hypertension - Plan: Basic metabolic panel  Vitamin D deficiency, unspecified - Plan: VITAMIN D 25 Hydroxy (Vit-D Deficiency, Fractures)  Dyslipidemia - Plan: Lipid panel  Colon cancer screening - Plan: Ambulatory referral to Gastroenterology  If you need refills please call your pharmacy. Do not use My Chart to request refills or for acute issues that need immediate attention.   Please be sure medication list is accurate. If a new problem present, please set up appointment sooner than planned today.  Health Maintenance, Female Adopting a healthy lifestyle and getting preventive care are important in promoting health and wellness. Ask your health care provider about: The right schedule for you to have regular tests and exams. Things you can do on your own to prevent diseases and keep yourself healthy. What should I know about diet, weight, and exercise? Eat a healthy diet  Eat a diet that includes plenty of vegetables, fruits, low-fat dairy products, and lean protein. Do not eat a lot of foods that are high in solid fats, added sugars, or sodium. Maintain a healthy weight Body mass index (BMI) is used to identify weight problems. It estimates body fat based on height and weight. Your health care provider can help determine your BMI and help you achieve or maintain a healthy weight. Get regular exercise Get regular exercise. This is one of the most important things you can do for your health. Most adults should: Exercise for at least 150 minutes each week. The exercise should increase your heart rate and make you sweat (moderate-intensity exercise). Do strengthening exercises at least twice a week. This is in addition to the moderate-intensity exercise. Spend less time sitting. Even light physical activity  can be beneficial. Watch cholesterol and blood lipids Have your blood tested for lipids and cholesterol at 68 years of age, then have this test every 5 years. Have your cholesterol levels checked more often if: Your lipid or cholesterol levels are high. You are older than 68 years of age. You are at high risk for heart disease. What should I know about cancer screening? Depending on your health history and family history, you may need to have cancer screening at various ages. This may include screening for: Breast cancer. Cervical cancer. Colorectal cancer. Skin cancer. Lung cancer. What should I know about heart disease, diabetes, and high blood pressure? Blood pressure and heart disease High blood pressure causes heart disease and increases the risk of stroke. This is more likely to develop in people who have high blood pressure readings, are of African descent, or are overweight. Have your blood pressure checked: Every 3-5 years if you are 41-8 years of age. Every year if you are 10 years old or older. Diabetes Have regular diabetes screenings. This checks your fasting blood sugar level. Have the screening done: Once every three years after age 72 if you are at a normal weight and have a low risk for diabetes. More often and at a younger age if you are overweight or have a high risk for diabetes. What should I know about preventing infection? Hepatitis B If you have a higher risk for hepatitis B, you should be screened for this virus. Talk with your health care provider to find out if you are at risk for hepatitis B infection. Hepatitis C Testing is recommended for: Everyone born from  1943 through [birth year]. Anyone with known risk factors for hepatitis C. Sexually transmitted infections (STIs) Get screened for STIs, including gonorrhea and chlamydia, if: You are sexually active and are younger than 68 years of age. You are older than 68 years of age and your health care provider  tells you that you are at risk for this type of infection. Your sexual activity has changed since you were last screened, and you are at increased risk for chlamydia or gonorrhea. Ask your health care provider if you are at risk. Ask your health care provider about whether you are at high risk for HIV. Your health care provider may recommend a prescription medicine to help prevent HIV infection. If you choose to take medicine to prevent HIV, you should first get tested for HIV. You should then be tested every 3 months for as long as you are taking the medicine. Pregnancy If you are about to stop having your period (premenopausal) and you may become pregnant, seek counseling before you get pregnant. Take 400 to 800 micrograms (mcg) of folic acid every day if you become pregnant. Ask for birth control (contraception) if you want to prevent pregnancy. Osteoporosis and menopause Osteoporosis is a disease in which the bones lose minerals and strength with aging. This can result in bone fractures. If you are 75 years old or older, or if you are at risk for osteoporosis and fractures, ask your health care provider if you should: Be screened for bone loss. Take a calcium or vitamin D supplement to lower your risk of fractures. Be given hormone replacement therapy (HRT) to treat symptoms of menopause. Follow these instructions at home: Lifestyle Do not use any products that contain nicotine or tobacco, such as cigarettes, e-cigarettes, and chewing tobacco. If you need help quitting, ask your health care provider. Do not use street drugs. Do not share needles. Ask your health care provider for help if you need support or information about quitting drugs. Alcohol use Do not drink alcohol if: Your health care provider tells you not to drink. You are pregnant, may be pregnant, or are planning to become pregnant. If you drink alcohol: Limit how much you use to 0-1 drink a day. Limit intake if you are  breastfeeding. Be aware of how much alcohol is in your drink. In the [place] one drink equals one 12 oz bottle of beer (355 mL), one 5 oz glass of wine (148 mL), or one 1 oz glass of hard liquor (44 mL). General instructions Schedule regular health, dental, and eye exams. Stay current with your vaccines. Tell your health care provider if: You often feel depressed. You have ever been abused or do not feel safe at home. Summary Adopting a healthy lifestyle and getting preventive care are important in promoting health and wellness. Follow your health care provider's instructions about healthy diet, exercising, and getting tested or screened for diseases. Follow your health care provider's instructions on monitoring your cholesterol and blood pressure. This information is not intended to replace advice given to you by your health care provider. Make sure you discuss any questions you have with your health care provider. Document Revised: 04/26/2020 Document Reviewed: 02/09/2018 Elsevier Patient Education  2022 [employer].

## 2021-01-04 MED ORDER — AMLODIPINE BESYLATE 5 MG PO TABS: 5.0000 mg | ORAL_TABLET | Freq: Every day | ORAL | 0 refills | Status: DC

## 2021-01-04 MED ORDER — VALSARTAN-HYDROCHLOROTHIAZIDE 160-12.5 MG PO TABS: 1.0000 | ORAL_TABLET | Freq: Every day | ORAL | 0 refills | Status: DC

## 2021-01-29 DIAGNOSIS — Z800 Family history of malignant neoplasm of digestive organs: Secondary | ICD-10-CM | POA: Diagnosis not present

## 2021-01-29 DIAGNOSIS — K635 Polyp of colon: Secondary | ICD-10-CM | POA: Diagnosis not present

## 2021-01-29 DIAGNOSIS — D123 Benign neoplasm of transverse colon: Secondary | ICD-10-CM | POA: Diagnosis not present

## 2021-01-29 DIAGNOSIS — Z1211 Encounter for screening for malignant neoplasm of colon: Secondary | ICD-10-CM | POA: Diagnosis not present

## 2021-01-29 DIAGNOSIS — D125 Benign neoplasm of sigmoid colon: Secondary | ICD-10-CM | POA: Diagnosis not present

## 2021-02-11 DIAGNOSIS — H401132 Primary open-angle glaucoma, bilateral, moderate stage: Secondary | ICD-10-CM | POA: Diagnosis not present

## 2021-03-01 ENCOUNTER — Other Ambulatory Visit: Payer: Self-pay | Admitting: Family Medicine

## 2021-03-20 ENCOUNTER — Ambulatory Visit (INDEPENDENT_AMBULATORY_CARE_PROVIDER_SITE_OTHER): Payer: Medicare Other

## 2021-03-20 VITALS — Ht 60.0 in | Wt 146.0 lb

## 2021-03-20 DIAGNOSIS — Z0000 Encounter for general adult medical examination without abnormal findings: Secondary | ICD-10-CM

## 2021-03-20 NOTE — Patient Instructions (Addendum)
Nichole Walters , Thank you for taking time to come for your Medicare Wellness Visit. I appreciate your ongoing commitment to your health goals. Please review the following plan we discussed and let me know if I can assist you in the future.   These are the goals we discussed:  Goals      Increase physical activity     Patient want to walk 3 miles x5 days.        This is a list of the screening recommended for you and due dates:  Health Maintenance  Topic Date Due   Zoster (Shingles) Vaccine (1 of 2) 06/18/2021*   Mammogram  08/01/2022   Colon Cancer Screening  07/31/2025   Tetanus Vaccine  07/18/2026   Pneumonia Vaccine  Completed   DEXA scan (bone density measurement)  Completed   COVID-19 Vaccine  Completed   Hepatitis C Screening: USPSTF Recommendation to screen - Ages 26-79 yo.  Completed   HPV Vaccine  Aged Out   Flu Shot  Discontinued  *Topic was postponed. The date shown is not the original due date.   Advanced directives: No  Conditions/risks identified: None  Next appointment: Follow up in one year for your annual wellness visit    Preventive Care 65 Years and Older, Female Preventive care refers to lifestyle choices and visits with your health care provider that can promote health and wellness. What does preventive care include? A yearly physical exam. This is also called an annual well check. Dental exams once or twice a year. Routine eye exams. Ask your health care provider how often you should have your eyes checked. Personal lifestyle choices, including: Daily care of your teeth and gums. Regular physical activity. Eating a healthy diet. Avoiding tobacco and drug use. Limiting alcohol use. Practicing safe sex. Taking low-dose aspirin every day. Taking vitamin and mineral supplements as recommended by your health care provider. What happens during an annual well check? The services and screenings done by your health care provider during your annual well  check will depend on your age, overall health, lifestyle risk factors, and family history of disease. Counseling  Your health care provider may ask you questions about your: Alcohol use. Tobacco use. Drug use. Emotional well-being. Home and relationship well-being. Sexual activity. Eating habits. History of falls. Memory and ability to understand (cognition). Work and work Statistician. Reproductive health. Screening  You may have the following tests or measurements: Height, weight, and BMI. Blood pressure. Lipid and cholesterol levels. These may be checked every 5 years, or more frequently if you are over 64 years old. Skin check. Lung cancer screening. You may have this screening every year starting at age 78 if you have a 30-pack-year history of smoking and currently smoke or have quit within the past 15 years. Fecal occult blood test (FOBT) of the stool. You may have this test every year starting at age 5. Flexible sigmoidoscopy or colonoscopy. You may have a sigmoidoscopy every 5 years or a colonoscopy every 10 years starting at age 83. Hepatitis C blood test. Hepatitis B blood test. Sexually transmitted disease (STD) testing. Diabetes screening. This is done by checking your blood sugar (glucose) after you have not eaten for a while (fasting). You may have this done every 1-3 years. Bone density scan. This is done to screen for osteoporosis. You may have this done starting at age 72. Mammogram. This may be done every 1-2 years. Talk to your health care provider about how often you should have  regular mammograms. Talk with your health care provider about your test results, treatment options, and if necessary, the need for more tests. Vaccines  Your health care provider may recommend certain vaccines, such as: Influenza vaccine. This is recommended every year. Tetanus, diphtheria, and acellular pertussis (Tdap, Td) vaccine. You may need a Td booster every 10 years. Zoster  vaccine. You may need this after age 21. Pneumococcal 13-valent conjugate (PCV13) vaccine. One dose is recommended after age 61. Pneumococcal polysaccharide (PPSV23) vaccine. One dose is recommended after age 59. Talk to your health care provider about which screenings and vaccines you need and how often you need them. This information is not intended to replace advice given to you by your health care provider. Make sure you discuss any questions you have with your health care provider. Document Released: 03/15/2015 Document Revised: 11/06/2015 Document Reviewed: 12/18/2014 Elsevier Interactive Patient Education  2017 [place] Prevention in the Home Falls can cause injuries. They can happen to people of all ages. There are many things you can do to make your home safe and to help prevent falls. What can I do on the outside of my home? Regularly fix the edges of walkways and driveways and fix any cracks. Remove anything that might make you trip as you walk through a door, such as a raised step or threshold. Trim any bushes or trees on the path to your home. Use bright outdoor lighting. Clear any walking paths of anything that might make someone trip, such as rocks or tools. Regularly check to see if handrails are loose or broken. Make sure that both sides of any steps have handrails. Any raised decks and porches should have guardrails on the edges. Have any leaves, snow, or ice cleared regularly. Use sand or salt on walking paths during winter. Clean up any spills in your garage right away. This includes oil or grease spills. What can I do in the bathroom? Use night lights. Install grab bars by the toilet and in the tub and shower. Do not use towel bars as grab bars. Use non-skid mats or decals in the tub or shower. If you need to sit down in the shower, use a plastic, non-slip stool. Keep the floor dry. Clean up any water that spills on the floor as soon as it happens. Remove  soap buildup in the tub or shower regularly. Attach bath mats securely with double-sided non-slip rug tape. Do not have throw rugs and other things on the floor that can make you trip. What can I do in the bedroom? Use night lights. Make sure that you have a light by your bed that is easy to reach. Do not use any sheets or blankets that are too big for your bed. They should not hang down onto the floor. Have a firm chair that has side arms. You can use this for support while you get dressed. Do not have throw rugs and other things on the floor that can make you trip. What can I do in the kitchen? Clean up any spills right away. Avoid walking on wet floors. Keep items that you use a lot in easy-to-reach places. If you need to reach something above you, use a strong step stool that has a grab bar. Keep electrical cords out of the way. Do not use floor polish or wax that makes floors slippery. If you must use wax, use non-skid floor wax. Do not have throw rugs and other things on the floor that  can make you trip. What can I do with my stairs? Do not leave any items on the stairs. Make sure that there are handrails on both sides of the stairs and use them. Fix handrails that are broken or loose. Make sure that handrails are as long as the stairways. Check any carpeting to make sure that it is firmly attached to the stairs. Fix any carpet that is loose or worn. Avoid having throw rugs at the top or bottom of the stairs. If you do have throw rugs, attach them to the floor with carpet tape. Make sure that you have a light switch at the top of the stairs and the bottom of the stairs. If you do not have them, ask someone to add them for you. What else can I do to help prevent falls? Wear shoes that: Do not have high heels. Have rubber bottoms. Are comfortable and fit you well. Are closed at the toe. Do not wear sandals. If you use a stepladder: Make sure that it is fully opened. Do not climb a  closed stepladder. Make sure that both sides of the stepladder are locked into place. Ask someone to hold it for you, if possible. Clearly mark and make sure that you can see: Any grab bars or handrails. First and last steps. Where the edge of each step is. Use tools that help you move around (mobility aids) if they are needed. These include: Canes. Walkers. Scooters. Crutches. Turn on the lights when you go into a dark area. Replace any light bulbs as soon as they burn out. Set up your furniture so you have a clear path. Avoid moving your furniture around. If any of your floors are uneven, fix them. If there are any pets around you, be aware of where they are. Review your medicines with your doctor. Some medicines can make you feel dizzy. This can increase your chance of falling. Ask your doctor what other things that you can do to help prevent falls. This information is not intended to replace advice given to you by your health care provider. Make sure you discuss any questions you have with your health care provider. Document Released: 12/13/2008 Document Revised: 07/25/2015 Document Reviewed: 03/23/2014 Elsevier Interactive Patient Education  2017 [employer].

## 2021-03-20 NOTE — Progress Notes (Signed)
Subjective:   Cole Klugh is a 71 y.o. female who presents for Medicare Annual (Subsequent) preventive examination.  Review of Systems    No ROS Cardiac Risk Factors include: advanced age (>55men, >41 women);hypertension    Objective:    Today's Vitals   03/20/21 1348  Weight: 146 lb (66.2 kg)  Height: 5' (1.524 m)   Body mass index is 28.51 kg/m.  Advanced Directives 03/20/2021 10/15/2020 08/05/2018 03/09/2018 09/06/2017 07/11/2015  Does Patient Have a Medical Advance Directive? No No No No No No  Would patient like information on creating a medical advance directive? No - Patient declined Yes (MAU/Ambulatory/Procedural Areas - Information given) Yes (MAU/Ambulatory/Procedural Areas - Information given) Yes (MAU/Ambulatory/Procedural Areas - Information given) Yes (ED - Information included in AVS) No - patient declined information    Current Medications (verified) Outpatient Encounter Medications as of 03/20/2021  Medication Sig   amLODipine (NORVASC) 5 MG tablet Take 1 tablet (5 mg total) by mouth daily.   calcium carbonate (CALTRATE 600) 1500 (600 Ca) MG TABS tablet Take 600 mg of elemental calcium by mouth 2 (two) times daily with a meal.   ibuprofen (ADVIL) 600 MG tablet Take 1 tablet (600 mg total) by mouth every 6 (six) hours as needed for moderate pain. For AFTER surgery only   latanoprost (XALATAN) 0.005 % ophthalmic solution Place 1 drop into both eyes at bedtime.   OVER THE COUNTER MEDICATION Take 2 capsules by mouth 3 (three) times daily. Ligaplex II 5300 - herbal supplement   pravastatin (PRAVACHOL) 20 MG tablet TAKE 1 TABLET BY MOUTH EVERY DAY   senna-docusate (SENOKOT-S) 8.6-50 MG tablet Take 2 tablets by mouth at bedtime. For AFTER surgery, do not take if having diarrhea   valsartan-hydrochlorothiazide (DIOVAN-HCT) 160-12.5 MG tablet Take 1 tablet by mouth daily.   No facility-administered encounter medications on file as of 03/20/2021.    Allergies  (verified) Celexa [citalopram]   History: Past Medical History:  Diagnosis Date   Anemia    Anxiety    no meds   COVID-19    2022   Dyslipidemia 05/08/2013   Essential hypertension 05/08/2013   Glaucoma    Palpitations 05/08/2013   Hx    SVD (spontaneous vaginal delivery)    x 1   Past Surgical History:  Procedure Laterality Date   CARDIAC CATHETERIZATION  04/25/2001   EF>60%   COLONOSCOPY     DOPPLER ECHOCARDIOGRAPHY  02/13/2009   EF>55%   EXCISION MASS NECK     HYSTEROSCOPY N/A 07/22/2015   Procedure: HYSTEROSCOPY;  Surgeon: Alden Hipp, MD;  Location: [facility];  Service: Gynecology;  Laterality: N/A;   NM MYOVIEW LTD  02/13/2009   EF 70%  Low risk scan   NM PERSANTINE MYOVIEW LTD  01/30/2009   EF> 70%. No ischemia or infarction   ROBOTIC ASSISTED TOTAL HYSTERECTOMY WITH BILATERAL SALPINGO OOPHERECTOMY N/A 10/22/2020   Procedure: XI ROBOTIC ASSISTED TOTAL HYSTERECTOMY WITH BILATERAL SALPINGO OOPHORECTOMY;  Surgeon: Everitt Amber, MD;  Location: [facility];  Service: Gynecology;  Laterality: N/A;   TOE SURGERY     TRANSTHORACIC ECHOCARDIOGRAM  01/30/2009   Normal LV size and function. Mild MR and TR. Otherwise normal. Normal diastolic function.   WISDOM TOOTH EXTRACTION     Family History  Problem Relation Age of Onset   Colon cancer Sister    Colon cancer Brother    Colon cancer Cousin    Social History   Socioeconomic History   Marital status: Married  Spouse name: Not on file   Number of children: Not on file   Years of education: Not on file   Highest education level: Not on file  Occupational History   Not on file  Tobacco Use   Smoking status: Never   Smokeless tobacco: Never  Vaping Use   Vaping Use: Never used  Substance and Sexual Activity   Alcohol use: No   Drug use: No   Sexual activity: Not Currently    Birth control/protection: Post-menopausal  Other Topics Concern   Not on file  Social History Narrative   Single mother of one,  grandmother 58, great-grandmother 2.   Does not smoke, does not drink.   Exercises usually on treadmill mostly 3 days a week.   Social Determinants of Health   Financial Resource Strain: Low Risk    Difficulty of Paying Living Expenses: Not hard at all  Food Insecurity: No Food Insecurity   Worried About Charity fundraiser in the Last Year: Never true   [place] in the Last Year: Never true  Transportation Needs: No Transportation Needs   Lack of Transportation (Medical): No   Lack of Transportation (Non-Medical): No  Physical Activity: Sufficiently Active   Days of Exercise per Week: 5 days   Minutes of Exercise per Session: 30 min  Stress: No Stress Concern Present   Feeling of Stress : Not at all  Social Connections: Socially Integrated   Frequency of Communication with Friends and Family: More than three times a week   Frequency of Social Gatherings with Friends and Family: More than three times a week   Attends Religious Services: More than 4 times per year   Active Member of [employer] or Organizations: Yes   Attends Music therapist: More than 4 times per year   Marital Status: Married    Clinical Intake:  Pre-visit preparation completed: Yes  Diabetic? No  Interpreter Needed?: NoActivities of Daily Living In your present state of health, do you have any difficulty performing the following activities: 03/20/2021 10/15/2020  Hearing? N N  Vision? 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 N N  Walking or climbing stairs? N N  Dressing or bathing? N N  Doing errands, shopping? N N  Preparing Food and eating ? N -  Using the Toilet? N -  In the past six months, have you accidently leaked urine? N -  Do you have problems with loss of bowel control? N -  Managing your Medications? N -  Managing your Finances? N -  Housekeeping or managing your Housekeeping? N -  Some recent data might be hidden     Patient Care Team: Martinique, Betty G, MD as PCP - General (Family Medicine) Leonie Man, MD as PCP - Cardiology (Cardiology)  Indicate any recent Medical Services you may have received from other than [facility] providers in the past year (date may be approximate).     Assessment:   This is a routine wellness examination for Senya.  Virtual Visit via Telephone Note  I connected with  Audris Speaker on 03/20/21 at  1:45 PM EST by telephone and verified that I am speaking with the correct person using two identifiers.  Location: Patient: Home  Provider: Office Persons participating in the virtual visit: patient/Nurse Health Advisor   I discussed the limitations, risks, security and privacy concerns of performing an evaluation and management service  by telephone and the availability of in person appointments. The patient expressed understanding and agreed to proceed.  Interactive audio and video telecommunications were attempted between this nurse and patient, however failed, due to patient having technical difficulties OR patient did not have access to video capability.  We continued and completed visit with audio only.  Some vital signs may be absent or patient reported.   Criselda Peaches, LPN   Hearing/Vision screen Hearing Screening - Comments:: No difficulty hearing Vision Screening - Comments:: Wears glasses and contacts. Followed by Dr Charma Igo  Dietary issues and exercise activities discussed: Current Exercise Habits: Home exercise routine, Type of exercise: walking, Time (Minutes): 30, Frequency (Times/Week): 5, Weekly Exercise (Minutes/Week): 150, Intensity: Moderate   Goals Addressed             This Visit's Progress    Increase physical activity       Patient want to walk 3 miles x5 days.       Depression Screen PHQ 2/9 Scores 03/20/2021 01/01/2021 12/28/2018 09/06/2017  PHQ - 2 Score 0 0 0 0    Fall Risk Fall Risk  03/20/2021 03/20/2021  01/01/2021 12/28/2018 09/06/2017  Falls in the past year? 1 1 0 0 No  Number falls in past yr: 0 0 - 0 -  Comment - Fall w/o injuryor medical attention - - -  Injury with Fall? 0 0 - 0 -  Risk for fall due to : - - - Orthopedic patient -  Follow up - - - Education provided -    [place]:  Any stairs in or around the home? No  If so, are there any without handrails? No  Home free of loose throw rugs in walkways, pet beds, electrical cords, etc? Yes  Adequate lighting in your home to reduce risk of falls? Yes   ASSISTIVE DEVICES UTILIZED TO PREVENT FALLS:  Life alert? No  Use of a cane, walker or w/c? No  Grab bars in the bathroom? No  Shower chair or bench in shower? No  Elevated toilet seat or a handicapped toilet? No   TIMED UP AND GO:  Was the test performed? No . Audio Visit  Cognitive Function:   6CIT Screen 03/20/2021  What Year? 0 points  What month? 0 points  What time? 0 points  Count back from 20 0 points  Months in reverse 0 points  Repeat phrase 0 points  Total Score 0    Immunizations Immunization History  Administered Date(s) Administered   Hep A / Hep B 03/09/2017, 04/12/2017, 09/06/2017   PFIZER Comirnaty(Gray Top)Covid-19 Tri-Sucrose Vaccine 04/24/2019, 05/15/2019, 12/04/2019   Pneumococcal Conjugate-13 09/06/2017   Pneumococcal Polysaccharide-23 12/28/2018   Tdap 07/17/2016    Qualifies for Shingles Vaccine? Yes   Zostavax completed No   Shingrix Completed?: No.    Education has been provided regarding the importance of this vaccine. Patient has been advised to call insurance company to determine out of pocket expense if they have not yet received this vaccine. Advised may also receive vaccine at local pharmacy or Health Dept. Verbalized acceptance and understanding.  Screening Tests Health Maintenance  Topic Date Due   Zoster Vaccines- Shingrix (1 of 2) 06/18/2021 (Originally 09/03/2002)   MAMMOGRAM  08/01/2022    COLONOSCOPY (Pts 45-33yrs Insurance coverage will need to be confirmed)  07/31/2025   TETANUS/TDAP  07/18/2026   Pneumonia Vaccine 71+ Years old  Completed   DEXA SCAN  Completed  COVID-19 Vaccine  Completed   Hepatitis C Screening  Completed   HPV VACCINES  Aged Out   INFLUENZA VACCINE  Discontinued    Health Maintenance  There are no preventive care reminders to display for this patient.    Additional Screening:   Vision Screening: Recommended annual ophthalmology exams for early detection of glaucoma and other disorders of the eye. Is the patient up to date with their annual eye exam?  Yes  Who is the provider or what is the name of the office in which the patient attends annual eye exams? Followed by Dr Charma Igo .   Dental Screening: Recommended annual dental exams for proper oral hygiene  Community Resource Referral / Chronic Care Management:  CRR required this visit?  No   CCM required this visit?  No      Plan:     I have personally reviewed and noted the following in the patients chart:   Medical and social history Use of alcohol, tobacco or illicit drugs  Current medications and supplements including opioid prescriptions. Patient currently not taking opioids Functional ability and status Nutritional status Physical activity Advanced directives List of other physicians Hospitalizations, surgeries, and ER visits in previous 12 months Vitals Screenings to include cognitive, depression, and falls Referrals and appointments  In addition, I have reviewed and discussed with patient certain preventive protocols, quality metrics, and best practice recommendations. A written personalized care plan for preventive services as well as general preventive health recommendations were provided to patient.     Criselda Peaches, LPN   07/02/8880

## 2021-04-24 ENCOUNTER — Other Ambulatory Visit: Payer: Self-pay | Admitting: Family Medicine

## 2021-06-02 ENCOUNTER — Encounter: Payer: Self-pay | Admitting: Cardiology

## 2021-06-02 ENCOUNTER — Ambulatory Visit (INDEPENDENT_AMBULATORY_CARE_PROVIDER_SITE_OTHER): Payer: Medicare Other | Admitting: Cardiology

## 2021-06-02 VITALS — BP 128/72 | HR 62 | Ht 60.0 in | Wt 144.8 lb

## 2021-06-02 DIAGNOSIS — I10 Essential (primary) hypertension: Secondary | ICD-10-CM

## 2021-06-02 DIAGNOSIS — E785 Hyperlipidemia, unspecified: Secondary | ICD-10-CM

## 2021-06-02 DIAGNOSIS — R002 Palpitations: Secondary | ICD-10-CM

## 2021-06-02 DIAGNOSIS — I739 Peripheral vascular disease, unspecified: Secondary | ICD-10-CM

## 2021-06-02 DIAGNOSIS — R001 Bradycardia, unspecified: Secondary | ICD-10-CM | POA: Insufficient documentation

## 2021-06-02 MED ORDER — ROSUVASTATIN CALCIUM 20 MG PO TABS: 20.0000 mg | ORAL_TABLET | Freq: Every day | ORAL | 3 refills | Status: DC

## 2021-06-02 NOTE — Patient Instructions (Signed)
Medication Instructions:  ? ? Stop taking pravastatin  ? ?Start taking Rosuvastatin 20 mg  -one tablet at bedtime ? ?*If you need a refill on your cardiac medications before your next appointment, please call your pharmacy* ? ? ?Lab Work: ?Not needed ? ? ? ?Testing/Procedures: ? CT coronary calcium score.  ? ?Test locations:  ?[facility] ([address] [place], [place] [address]) ?[facility] ([address] [place], [place] [address])  ? ?This is $99 out of pocket. ? ? ?Coronary CalciumScan ?A coronary calcium scan is an imaging test used to look for deposits of calcium and other fatty materials (plaques) in the inner lining of the blood vessels of the heart (coronary arteries). These deposits of calcium and plaques can partly clog and narrow the coronary arteries without producing any symptoms or warning signs. This puts a person at risk for a heart attack. This test can detect these deposits before symptoms develop. ?Tell a health care provider about: ?Any allergies you have. ?All medicines you are taking, including vitamins, herbs, eye drops, creams, and over-the-counter medicines. ?Any problems you or family members have had with anesthetic medicines. ?Any blood disorders you have. ?Any surgeries you have had. ?Any medical conditions you have. ?Whether you are pregnant or may be pregnant. ?What are the risks? ?Generally, this is a safe procedure. However, problems may occur, including: ?Harm to a pregnant woman and her unborn baby. This test involves the use of radiation. Radiation exposure can be dangerous to a pregnant woman and her unborn baby. If you are pregnant, you generally should not have this procedure done. ?Slight increase in the risk of cancer. This is because of the radiation involved in the test. ?What happens before the procedure? ?No preparation is needed for this procedure. ?What happens during the procedure? ?You will undress and remove any jewelry around your neck or  chest. ?You will put on a hospital gown. ?Sticky electrodes will be placed on your chest. The electrodes will be connected to an electrocardiogram (ECG) machine to record a tracing of the electrical activity of your heart. ?A CT scanner will take pictures of your heart. During this time, you will be asked to lie still and hold your breath for 2-3 seconds while a picture of your heart is being taken. ?The procedure may vary among health care providers and hospitals. ?What happens after the procedure? ?You can get dressed. ?You can return to your normal activities. ?It is up to you to get the results of your test. Ask your health care provider, or the department that is doing the test, when your results will be ready. ?Summary ?A coronary calcium scan is an imaging test used to look for deposits of calcium and other fatty materials (plaques) in the inner lining of the blood vessels of the heart (coronary arteries). ?Generally, this is a safe procedure. Tell your health care provider if you are pregnant or may be pregnant. ?No preparation is needed for this procedure. ?A CT scanner will take pictures of your heart. ?You can return to your normal activities after the scan is done. ?This information is not intended to replace advice given to you by your health care provider. Make sure you discuss any questions you have with your health care provider. ?Document Released: 08/15/2007 Document Revised: 01/06/2016 Document Reviewed: 01/06/2016 ?Elsevier Interactive Patient Education ? 2017 Elsevier Inc.  ? ? ?Follow-Up: ?At [facility], you and your health needs are our priority.  As part of our continuing mission to  provide you with exceptional heart care, we have created designated Provider Care Teams.  These Care Teams include your primary Cardiologist (physician) and Advanced Practice Providers (APPs -  Physician Assistants and Nurse Practitioners) who all work together to provide you with the care you need, when you  need it. ? ?  ? ?Your next appointment:   ?2 month(s) ? ?The format for your next appointment:   ?In Person ? ?Provider:   ?Glenetta Hew, MD  ? ? ?Other Instructions  ?

## 2021-06-02 NOTE — Progress Notes (Signed)
? ? ?Primary Care Provider: Martinique, Betty G, MD ?Cardiologist: Nichole Hew, MD ?Electrophysiologist: None ? ?Clinic Note: ?Chief Complaint  ?Patient presents with  ? Follow-up  ?  She wants to to discuss the results of hs-CRP  ? ?=================================== ? ?ASSESSMENT/PLAN  ? ?Problem List Items Addressed This Visit   ? ?  ? Cardiology Problems  ? Hyperlipidemia with target LDL less than 100 - Primary (Chronic)  ?  Target LDL of less than 100 -> she has been on pravastatin for many years.  Her most recent labs showed LDL of 114 in November with minimal changes in March. ? ?She is concerned about hs CRP level being mildly elevated. => ?Plan: ?Change from pravastatin to rosuvastatin 40 mg ?Check Coronary Calcium Score for baseline risk stratification ?  ?  ? Relevant Medications  ? rosuvastatin (CRESTOR) 20 MG tablet  ? Other Relevant Orders  ? CT CARDIAC SCORING (SELF PAY ONLY)  ? Essential hypertension (Chronic)  ?  Blood pressure is well controlled today on current meds. ?Continue current dose of amlodipine 5 mg daily, valsartan-HCTZ 160-12.5 mg daily ?  ?  ? Relevant Medications  ? rosuvastatin (CRESTOR) 20 MG tablet  ?  ? Other  ? Palpitations (Chronic)  ?  Palpitations and they are down bilateral lower EXTR.  They usually happen at night when she is tired.  Sounds like is consistent with PACs and PVCs.  Not overly symptomatic.  We will hold off on further testing. ?  ?  ? Relevant Orders  ? EKG 12-Lead (Completed)  ? CT CARDIAC SCORING (SELF PAY ONLY)  ? Slow heart rate (Chronic)  ?  Stable heart rate of 60 bpm.  No heart block noted. ?No AV nodal agents. ?  ?  ? Relevant Orders  ? EKG 12-Lead (Completed)  ? CT CARDIAC SCORING (SELF PAY ONLY)  ? ? ?=================================== ? ?HPI:   ? ?Nichole Walters is a 71 y.o. female with a PMH below who presents today for delayed follow-up evaluation of palpitations and cardiac risk factors of hypertension and dyslipidemia.  She is being seen  today at the request of Martinique, Betty G, MD. ? ?Nichole Walters was last seen in July 2021 follow-up ABIs that were normal.  She had gone a little bit out of the habit of exercising.  She had been helping care for her brother before he died.  She was not eating as well as she should.  She did not note having any significant palpitations ?--> Prior to the COVID-19 lockdown back in 2027 2021, she was walking about an hour a day 3 days a week.  Unfortunately during the COVID-19 lockdown she was not going to [employer]. ?She still is try to stay as active as she can now that she is back home.  No chest pain or pressure with rest or exertion.  ?She did not have episodes of fluttering in her chest and sometimes she will feel her heart rate going really fast for 10 to 30 seconds which will take her breath away for short period but most notable episode was last month when it lasted about a minute.  She feels a steady regular fast heart rhythm that is somewhat forceful, but then goes away as fast as it comes on.  Not associated with lightheadedness or dizziness, just a little bit of dyspnea. ?=> No Med changes ? ?Recent Hospitalizations:  ?10/22/2020: Robotic assisted total hysterectomy oophrectomy.  Ovarian cysts with uterine leiomyoma ? ?Reviewed  CV studies:   ? ?The following studies were reviewed today: (if available, images/films reviewed: From Epic Chart or Care Everywhere) ?None: ? ?Interval History:  ? ?Nichole Walters returns here now for a really delayed follow-up stating that she been doing okay.  She was little concerned because she had a CRP level checked as part of her routine random screaming things that she does.  The hsCRP was 6.7 and she was very upset about this.  She was wanting to try to find out more about her cardiovascular risk. ? ?She feels her heart rate is skipping usually when she is trying to sleep at night.  She had about 4 episodes in the last 1 to 2 weeks.  He is a smoker as she is  very tired.  She thought maybe one of the worst episodes was because of indigestion. ? ?Still not as active as she used to be.  Has gained weight now. ? ?CV Review of Symptoms (Summary) ?Cardiovascular ROS: positive for - irregular heartbeat, palpitations, and usually these are occurring at nighttime when she is tired. ?negative for - chest pain, dyspnea on exertion, edema, orthopnea, paroxysmal nocturnal dyspnea, shortness of breath, or syncope/near syncope, TIA/amaurosis fugax.  ? ? ?REVIEWED OF SYSTEMS  ? ?Review of Systems  ?Constitutional:  Negative for malaise/fatigue and weight loss (Weight gain).  ?HENT:  Negative for nosebleeds.   ?Respiratory:  Negative for cough and shortness of breath.   ?Cardiovascular:   ?     Per HPI  ?Gastrointestinal:  Negative for blood in stool and melena.  ?Genitourinary:  Negative for hematuria.  ?Musculoskeletal:  Positive for joint pain.  ?Neurological:  Negative for dizziness.  ?Psychiatric/Behavioral: Negative.    ? ?I have reviewed and (if needed) personally updated the patient's problem list, medications, allergies, past medical and surgical history, social and family history.  ? ?PAST MEDICAL HISTORY  ? ?Past Medical History:  ?Diagnosis Date  ? Anemia   ? Anxiety   ? no meds  ? COVID-19   ? 2022  ? Dyslipidemia 05/08/2013  ? Essential hypertension 05/08/2013  ? Glaucoma   ? Palpitations 05/08/2013  ? Hx   ? SVD (spontaneous vaginal delivery)   ? x 1  ? ? ?PAST SURGICAL HISTORY  ? ?Past Surgical History:  ?Procedure Laterality Date  ? CARDIAC CATHETERIZATION  04/25/2001  ? EF>60%  ? COLONOSCOPY    ? DOPPLER ECHOCARDIOGRAPHY  02/13/2009  ? EF>55%  ? EXCISION MASS NECK    ? HYSTEROSCOPY N/A 07/22/2015  ? Procedure: HYSTEROSCOPY;  Surgeon: Alden Hipp, MD;  Location: [facility];  Service: Gynecology;  Laterality: N/A;  ? NM MYOVIEW LTD  02/13/2009  ? EF 70%  Low risk scan  ? NM PERSANTINE MYOVIEW LTD  01/30/2009  ? EF> 70%. No ischemia or infarction  ? ROBOTIC ASSISTED TOTAL  HYSTERECTOMY WITH BILATERAL SALPINGO OOPHERECTOMY N/A 10/22/2020  ? Procedure: XI ROBOTIC ASSISTED TOTAL HYSTERECTOMY WITH BILATERAL SALPINGO OOPHORECTOMY;  Surgeon: Everitt Amber, MD;  Location: [facility];  Service: Gynecology;  Laterality: N/A;  ? TOE SURGERY    ? TRANSTHORACIC ECHOCARDIOGRAM  01/30/2009  ? Normal LV size and function. Mild MR and TR. Otherwise normal. Normal diastolic function.  ? WISDOM TOOTH EXTRACTION    ? ? ?Immunization History  ?Administered Date(s) Administered  ? Hep A / Hep B 03/09/2017, 04/12/2017, 09/06/2017  ? PFIZER Comirnaty(Gray Top)Covid-19 Tri-Sucrose Vaccine 04/24/2019, 05/15/2019, 12/04/2019  ? Pneumococcal Conjugate-13 09/06/2017  ? Pneumococcal Polysaccharide-23 12/28/2018  ? Tdap 07/17/2016  ? ? ?  MEDICATIONS/ALLERGIES  ? ?Current Meds  ?Medication Sig  ? amLODipine (NORVASC) 5 MG tablet TAKE 1 TABLET (5 MG TOTAL) BY MOUTH DAILY.  ? calcium carbonate (CALTRATE 600) 1500 (600 Ca) MG TABS tablet Take 600 mg of elemental calcium by mouth 2 (two) times daily with a meal.  ? latanoprost (XALATAN) 0.005 % ophthalmic solution Place 1 drop into both eyes at bedtime.  ? OVER THE COUNTER MEDICATION Take 2 capsules by mouth 3 (three) times daily. Ligaplex II 5300 - herbal supplement  ? pravastatin (PRAVACHOL) 20 MG tablet TAKE 1 TABLET BY MOUTH EVERY DAY  ? valsartan-hydrochlorothiazide (DIOVAN-HCT) 160-12.5 MG tablet Take 1 tablet by mouth daily.  ? ? ?Allergies  ?Allergen Reactions  ? Celexa [Citalopram] Swelling  ?  Mouth swells  ? ? ?SOCIAL HISTORY/FAMILY HISTORY  ? ?Reviewed in Epic:  ?Pertinent findings:  ?Social History  ? ?Tobacco Use  ? Smoking status: Never  ? Smokeless tobacco: Never  ?Vaping Use  ? Vaping Use: Never used  ?Substance Use Topics  ? Alcohol use: No  ? Drug use: No  ? ?Social History  ? ?Social History Narrative  ? Single mother of one, grandmother 6, great-grandmother 2.  ? Does not smoke, does not drink.  ? Exercises usually on treadmill mostly 3 days a week.   ? ? ?OBJCTIVE -PE, EKG, labs  ? ?Wt Readings from Last 3 Encounters:  ?06/02/21 144 lb 12.8 oz (65.7 kg)  ?03/20/21 146 lb (66.2 kg)  ?01/01/21 146 lb (66.2 kg)  ? ? ?Physical Exam: ?BP 128/72   Pulse 62   Ht

## 2021-06-06 ENCOUNTER — Encounter: Payer: Self-pay | Admitting: Cardiology

## 2021-06-06 NOTE — Assessment & Plan Note (Addendum)
Target LDL of less than 100 -> she has been on pravastatin for many years.  Her most recent labs showed LDL of 114 in November with minimal changes in March. ? ?She is concerned about hs CRP level being mildly elevated. => ?Plan: ?? Change from pravastatin to rosuvastatin 40 mg ?? Check Coronary Calcium Score for baseline risk stratification ?

## 2021-06-06 NOTE — Assessment & Plan Note (Signed)
Blood pressure is well controlled today on current meds. ?Continue current dose of amlodipine 5 mg daily, valsartan-HCTZ 160-12.5 mg daily ?

## 2021-06-06 NOTE — Assessment & Plan Note (Signed)
Palpitations and they are down bilateral lower EXTR.  They usually happen at night when she is tired.  Sounds like is consistent with PACs and PVCs.  Not overly symptomatic.  We will hold off on further testing. ?

## 2021-06-06 NOTE — Assessment & Plan Note (Signed)
Stable heart rate of 60 bpm.  No heart block noted. ?No AV nodal agents. ?

## 2021-06-11 ENCOUNTER — Telehealth: Payer: Self-pay | Admitting: Cardiology

## 2021-06-11 MED ORDER — VALSARTAN-HYDROCHLOROTHIAZIDE 160-12.5 MG PO TABS: 1.0000 | ORAL_TABLET | Freq: Every day | ORAL | 3 refills | Status: DC

## 2021-06-11 NOTE — Telephone Encounter (Signed)
?*  STAT* If patient is at the pharmacy, call can be transferred to refill team. ? ? ?1. Which medications need to be refilled? (please list name of each medication and dose if known) valsartan-hydrochlorothiazide (DIOVAN-HCT) 160-12.5 MG tablet ? ?2. Which pharmacy/location (including street and city if local pharmacy) is medication to be sent to? [id number] [place], [place] - [address] AT [address] ? ?3. Do they need a 30 day or 90 day supply? 90 day ? ? ? ?

## 2021-06-12 ENCOUNTER — Ambulatory Visit: Payer: Medicare Other | Admitting: Nurse Practitioner

## 2021-07-07 ENCOUNTER — Ambulatory Visit (INDEPENDENT_AMBULATORY_CARE_PROVIDER_SITE_OTHER)
Admission: RE | Admit: 2021-07-07 | Discharge: 2021-07-07 | Disposition: A | Discharge status: Home / Self Care | Payer: Self-pay | Source: Ambulatory Visit | Attending: Nurse Practitioner | Admitting: Nurse Practitioner

## 2021-07-07 DIAGNOSIS — R002 Palpitations: Secondary | ICD-10-CM

## 2021-07-07 DIAGNOSIS — E785 Hyperlipidemia, unspecified: Secondary | ICD-10-CM

## 2021-07-07 DIAGNOSIS — R001 Bradycardia, unspecified: Secondary | ICD-10-CM

## 2021-07-08 DIAGNOSIS — I2510 Atherosclerotic heart disease of native coronary artery without angina pectoris: Secondary | ICD-10-CM

## 2021-07-08 HISTORY — DX: Atherosclerotic heart disease of native coronary artery without angina pectoris: I25.10

## 2021-07-22 DIAGNOSIS — H401132 Primary open-angle glaucoma, bilateral, moderate stage: Secondary | ICD-10-CM | POA: Diagnosis not present

## 2021-07-27 ENCOUNTER — Other Ambulatory Visit: Payer: Self-pay | Admitting: Family Medicine

## 2021-08-04 ENCOUNTER — Ambulatory Visit (INDEPENDENT_AMBULATORY_CARE_PROVIDER_SITE_OTHER): Payer: Medicare Other

## 2021-08-04 ENCOUNTER — Encounter: Payer: Self-pay | Admitting: Cardiology

## 2021-08-04 ENCOUNTER — Ambulatory Visit: Payer: Medicare Other | Admitting: Cardiology

## 2021-08-04 VITALS — BP 136/66 | HR 72 | Ht 60.0 in | Wt 137.4 lb

## 2021-08-04 DIAGNOSIS — R931 Abnormal findings on diagnostic imaging of heart and coronary circulation: Secondary | ICD-10-CM | POA: Diagnosis not present

## 2021-08-04 DIAGNOSIS — R002 Palpitations: Secondary | ICD-10-CM

## 2021-08-04 DIAGNOSIS — R599 Enlarged lymph nodes, unspecified: Secondary | ICD-10-CM

## 2021-08-04 DIAGNOSIS — R001 Bradycardia, unspecified: Secondary | ICD-10-CM | POA: Diagnosis not present

## 2021-08-04 DIAGNOSIS — I700 Atherosclerosis of aorta: Secondary | ICD-10-CM | POA: Diagnosis not present

## 2021-08-04 DIAGNOSIS — I10 Essential (primary) hypertension: Secondary | ICD-10-CM | POA: Diagnosis not present

## 2021-08-04 DIAGNOSIS — E785 Hyperlipidemia, unspecified: Secondary | ICD-10-CM | POA: Diagnosis not present

## 2021-08-04 NOTE — Patient Instructions (Addendum)
Medication Instructions:  No changes    *If you need a refill on your cardiac medications before your next appointment, please call your pharmacy*   Lab Work:  Labs - in Oct 2023   fasting Lipids CMP If you have labs (blood work) drawn today and your tests are completely normal, you will receive your results only by: [place] (if you have MyChart) OR A paper copy in the mail If you have any lab test that is abnormal or we need to change your treatment, we will call you to review the results.   Testing/Procedures:   Will mailed to your home in Nov 30, 2021 Your physician has recommended that you wear a holter monitor 7 days . Holter monitors are medical devices that record the heart's electrical activity. Doctors most often use these monitors to diagnose arrhythmias. Arrhythmias are problems with the speed or rhythm of the heartbeat. The monitor is a small, portable device. You can wear one while you do your normal daily activities. This is usually used to diagnose what is causing palpitations/syncope (passing out).   Follow-Up: At [facility], you and your health needs are our priority.  As part of our continuing mission to provide you with exceptional heart care, we have created designated Provider Care Teams.  These Care Teams include your primary Cardiologist (physician) and Advanced Practice Providers (APPs -  Physician Assistants and Nurse Practitioners) who all work together to provide you with the care you need, when you need it.     Your next appointment:   6 month(s)  The format for your next appointment:   In Person  Provider:   Glenetta Hew, MD

## 2021-08-04 NOTE — Progress Notes (Unsigned)
Enrolled for [employer] to deliver a ZIO XT long term holter monitor to the patients address on file on 11/30/2021.

## 2021-08-04 NOTE — Progress Notes (Signed)
Primary Care Provider: Martinique, Betty G, MD Cardiologist: Glenetta Hew, MD Electrophysiologist: None  Clinic Note: Chief Complaint  Patient presents with   Follow-up    Review Coronary Calcium Score results.   ===================================  ASSESSMENT/PLAN   Problem List Items Addressed This Visit       Cardiology Problems   Hyperlipidemia with target LDL less than 70 (Chronic)    Now with Coronary Calcium Score of 200 all along with aortic atherosclerosis , LDL target less than 70      Agatston CAC score 200-399 - Primary (Chronic)    We spent some time again discussing the physiology of atherosclerotic disease with error coronary arteries or the aorta.  We discussed timing and appropriateness of stress testing, indicating that in the absence of symptoms, stress test is not always warranted.  Plan: Continue with aggressive risk factor modification having increased statin to rosuvastatin 20.  Lipid levels need to be rechecked in about 2-3 months (after having converted to rosuvastatin).  Level is not quite high enough to require aspirin.  Somewhat borderline.  For now, we will hold off, but could consider every other day dosing.      Relevant Orders   Lipid panel   Comprehensive metabolic panel   Essential hypertension (Chronic)    Relatively decent blood pressure control a combination of amlodipine and valsartan and HCTZ.  Borderline systolic 413.  Monitor.      Thoracic aortic atherosclerosis (HCC) (Chronic)    Not all that unexpected in a 71 year old with hyperlipidemia and hypertension. Plan continue cardiovascular risk reduction with guideline directed therapy.  Blood pressure and lipid control as per CAD with Coronary Calcium score of over 200.  Converted to rosuvastatin, on ARB and calcium channel blocker.  Has not been on beta-blocker because of history of bradycardia.        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  This is probably some that would be better evaluated with a dedicated chest CT.  Will defer to PCP, but may want to consider rechecking in 6 to 12 months.  Most recent findings seem to be potentially related to inflammatory condition.      Palpitations (Chronic)    Still having palpitations spells off and on.  I think this started to bother her a little bit more than usual.  Given the Coronary Calcium Score and her anxiety levels we will check a Zio patch monitor to ensure no arrhythmias.      Relevant Orders   LONG TERM MONITOR (3-14 DAYS)   Slow heart rate (Chronic)    She has had a stable valvular heart rate.  And on a beta-blocker.      ===================================  HPI:    Nichole Walters is a 71 y.o. female with a PMH below who presents today for 2  Month F/u Visit (to Discuss Test Results) evaluation of palpitations and cardiac risk factors of hypertension and dyslipidemia.  She is being seen today as aat the request of Martinique, Betty G, MD.  Lorna Dibble Charlsie Merles was seen in July 2021: Pre-COVID -> walking avg 3 hr/day (but stopped when [employer] shut down. --> not as active @ home => had gotten out of the habit of exercising.  Noted Sx of heart fluttering lasting 20-30 sec that would "take her breath away" - notes forceful beats in setting of fast HR  - no associated lightheadedness or dizziness.  Just some mild dyspnea.   I most recently Ok [facility] on 06/02/2021 as a delayed follow-up.  She was doing okay.  Still concerned about elevated CRP level checked on routine screening labs -> she also has these intermittent scans done for screening.  The hsCRP was 6.7 and she was very upset about this.  She was wanting to try to find out more about her cardiovascular risk. She also noted having skipping heartbeats usually when sleeping at night.  Not all the applicability for episodes in the last couple weeks.  Usually occur at night when she is  tired. Still not as active as she used to be.  Has gained weight now. Ordered Coronary calcium score, switch from pravastatin to rosuvastatin  Recent Hospitalizations:  None  Reviewed  CV studies:    The following studies were reviewed today: (if available, images/films reviewed: From Epic Chart or Care Everywhere) 07/08/2021 Coronary Calcium Score = 212, LM 144, LAD 68.  Noncardiac findings: Aortic atherosclerosis, small pericardial effusion.  Bilateral atelectasis/scarring with prominent mediastinal lymph nodes-Favor reactive.  Interval History:   Nichole Walters returns here now to discuss her Coronary Calcium Score results.  She was very concerned about the mediastinal lymph nodes-more so than the Coronary Calcium Score. She still has the intermittent irregular heartbeats-not as prominent as it had been, but they still do occur at night every so often, and usually when she is tired.  No real change otherwise.  No chest pain or pressure with rest or exertion.  No exertional dyspnea.  No PND, orthopnea or edema.  No syncope/near syncope or TIA/amaurosis fugax.  No claudication.  She was very concerned about the subcarinal lymph node, small pericardial effusion and aortic atherosclerosis focusing more on that than on the coronary calcium level.  We spent several minutes talking about the lung nodule pericardial effusion and then discussed atherosclerosis both aortic and coronary.  REVIEWED OF SYSTEMS   Review of Systems  Constitutional:  Negative for malaise/fatigue and weight loss (Weight gain).  HENT:  Negative for nosebleeds.   Respiratory:  Negative for cough and shortness of breath.   Cardiovascular:        Per HPI  Gastrointestinal:  Negative for blood in stool and melena.  Genitourinary:  Negative for hematuria.  Musculoskeletal:  Positive for joint pain.  Neurological:  Negative for dizziness.  Psychiatric/Behavioral: Negative.    All other systems reviewed and are  negative.   I have reviewed and (if needed) personally updated the patient's problem list, medications, allergies, past medical and surgical history, social and family history.   PAST MEDICAL HISTORY   Past Medical History:  Diagnosis Date   Anemia    Anxiety    no meds   COVID-19    2022   Dyslipidemia 05/08/2013   Essential hypertension 05/08/2013   Glaucoma    Palpitations 05/08/2013   Hx    SVD (spontaneous vaginal delivery)    x 1    PAST SURGICAL HISTORY   Past Surgical History:  Procedure Laterality Date   CARDIAC CATHETERIZATION  04/25/2001   EF>60%   COLONOSCOPY     DOPPLER ECHOCARDIOGRAPHY  02/13/2009   EF>55%   EXCISION MASS NECK     HYSTEROSCOPY N/A 07/22/2015   Procedure: HYSTEROSCOPY;  Surgeon: Alden Hipp, MD;  Location: [facility];  Service: Gynecology;  Laterality: N/A;   NM MYOVIEW LTD  02/13/2009   EF 70%  Low risk scan   NM PERSANTINE MYOVIEW LTD  01/30/2009  EF> 70%. No ischemia or infarction   ROBOTIC ASSISTED TOTAL HYSTERECTOMY WITH BILATERAL SALPINGO OOPHERECTOMY N/A 10/22/2020   Procedure: XI ROBOTIC ASSISTED TOTAL HYSTERECTOMY WITH BILATERAL SALPINGO OOPHORECTOMY;  Surgeon: Everitt Amber, MD;  Location: [facility];  Service: Gynecology;  Laterality: N/A;   TOE SURGERY     TRANSTHORACIC ECHOCARDIOGRAM  01/30/2009   Normal LV size and function. Mild MR and TR. Otherwise normal. Normal diastolic function.   WISDOM TOOTH EXTRACTION      Immunization History  Administered Date(s) Administered   Hep A / Hep B 03/09/2017, 04/12/2017, 09/06/2017   PFIZER Comirnaty(Gray Top)Covid-19 Tri-Sucrose Vaccine 04/24/2019, 05/15/2019, 12/04/2019   Pfizer Covid-19 Vaccine Bivalent Booster 76yr & up 02/26/2021   Pneumococcal Conjugate-13 09/06/2017   Pneumococcal Polysaccharide-23 12/28/2018   Tdap 07/17/2016   Zoster Recombinat (Shingrix) 04/01/2021, 07/19/2021    MEDICATIONS/ALLERGIES   Current Meds  Medication Sig   amLODipine (NORVASC) 5 MG tablet  TAKE 1 TABLET (5 MG TOTAL) BY MOUTH DAILY.   calcium carbonate (CALTRATE 600) 1500 (600 Ca) MG TABS tablet Take 600 mg of elemental calcium by mouth 2 (two) times daily with a meal.   latanoprost (XALATAN) 0.005 % ophthalmic solution Place 1 drop into both eyes at bedtime.   rosuvastatin (CRESTOR) 20 MG tablet Take 1 tablet (20 mg total) by mouth at bedtime.   valsartan-hydrochlorothiazide (DIOVAN-HCT) 160-12.5 MG tablet Take 1 tablet by mouth daily.   [DISCONTINUED] OVER THE COUNTER MEDICATION Take 2 capsules by mouth 3 (three) times daily. Ligaplex II 5300 - herbal supplement     Allergies  Allergen Reactions   Celexa [Citalopram] Swelling    Mouth swells    SOCIAL HISTORY/FAMILY HISTORY   Reviewed in Epic:  Pertinent findings:  Social History   Tobacco Use   Smoking status: Never   Smokeless tobacco: Never  Vaping Use   Vaping Use: Never used  Substance Use Topics   Alcohol use: No   Drug use: No   Social History   Social History Narrative   Single mother of one, grandmother 361 great-grandmother 2.   Does not smoke, does not drink.   Exercises usually on treadmill mostly 3 days a week.    OBJCTIVE -PE, EKG, labs   Wt Readings from Last 3 Encounters:  08/06/21 141 lb 3.2 oz (64 kg)  08/04/21 137 lb 6.4 oz (62.3 kg)  06/02/21 144 lb 12.8 oz (65.7 kg)    Physical Exam: BP 136/66   Pulse 72   Ht 5' (1.524 m)   Wt 137 lb 6.4 oz (62.3 kg)   SpO2 99%   BMI 26.83 kg/m  Physical Exam Vitals reviewed.  Constitutional:      General: She is not in acute distress.    Appearance: Normal appearance. She is normal weight. She is not ill-appearing.  HENT:     Head: Normocephalic and atraumatic.  Neck:     Vascular: No carotid bruit.  Cardiovascular:     Rate and Rhythm: Normal rate and regular rhythm. No extrasystoles are present.    Pulses: Normal pulses.     Heart sounds: Normal heart sounds, S1 normal and S2 normal. No murmur heard.    No friction rub. No  gallop.  Pulmonary:     Effort: Pulmonary effort is normal. No respiratory distress.     Breath sounds: Normal breath sounds. No wheezing, rhonchi or rales.  Chest:     Chest wall: No tenderness.  Musculoskeletal:     Cervical back: Normal range of  motion and neck supple.     Right lower leg: No edema.     Left lower leg: No edema.  Skin:    General: Skin is warm and dry.  Neurological:     General: No focal deficit present.     Mental Status: She is alert and oriented to person, place, and time.  Psychiatric:        Mood and Affect: Mood normal.        Behavior: Behavior normal.        Thought Content: Thought content normal.        Judgment: Judgment normal.     Adult ECG Report Not checked  Recent Labs: Reviewed.  Lipids have not changed in several years.  Taking the same dose of pravastatin. -> May 16 2018: TC 166, HDL 51, TG 150.  LDL 112. Lab Results  Component Value Date   CHOL 182 01/01/2021   HDL 46.90 01/01/2021   LDLCALC 114 (H) 01/01/2021   TRIG 108.0 01/01/2021   CHOLHDL 4 01/01/2021   Lab Results  Component Value Date   CREATININE 0.63 01/01/2021   BUN 10 01/01/2021   NA 139 01/01/2021   K 3.6 01/01/2021   CL 103 01/01/2021   CO2 30 01/01/2021      Latest Ref Rng & Units 10/15/2020    1:34 PM 02/22/2017    9:25 AM 07/16/2016   12:00 AM  CBC  WBC 4.0 - 10.5 K/uL 4.9   5.1   5.5       Hemoglobin 12.0 - 15.0 g/dL 13.0   12.3   12.6       Hematocrit 36.0 - 46.0 % 41.5   38.1   40       Platelets 150 - 400 K/uL 261   207.0   238          This result is from an external source.    Lab Results  Component Value Date   HGBA1C 5.4 07/16/2016   Lab Results  Component Value Date   TSH 2.21 07/16/2016    ==================================================  COVID-19 Education: The signs and symptoms of COVID-19 were discussed with the patient and how to seek care for testing (follow up with PCP or arrange E-visit).    I spent a total of 28  minutes with the patient spent in direct patient consultation.  Additional time spent with chart review  / charting (studies, outside notes, etc): 15 min Total Time: 43 min  Current medicines are reviewed at length with the patient today.  (+/- concerns) none  Notice: This dictation was prepared with Dragon dictation along with smart phrase technology. Any transcriptional errors that result from this process are unintentional and may not be corrected upon review.  Studies Ordered:   Orders Placed This Encounter  Procedures   Lipid panel   Comprehensive metabolic panel   LONG TERM MONITOR (3-14 DAYS)    Patient Instructions / Medication Changes & Studies & Tests Ordered   Patient Instructions  Medication Instructions:  No changes    *If you need a refill on your cardiac medications before your next appointment, please call your pharmacy*   Lab Work:  Labs - in Oct 2023   fasting Lipids CMP If you have labs (blood work) drawn today and your tests are completely normal, you will receive your results only by: [place] (if you have MyChart) OR A paper copy in the mail If you have any lab test that is  abnormal or we need to change your treatment, we will call you to review the results.   Testing/Procedures:   Will mailed to your home in Nov 30, 2021 Your physician has recommended that you wear a holter monitor 7 days . Holter monitors are medical devices that record the heart's electrical activity. Doctors most often use these monitors to diagnose arrhythmias. Arrhythmias are problems with the speed or rhythm of the heartbeat. The monitor is a small, portable device. You can wear one while you do your normal daily activities. This is usually used to diagnose what is causing palpitations/syncope (passing out).   Follow-Up: At [facility], you and your health needs are our priority.  As part of our continuing mission to provide you with exceptional heart care, we have  created designated Provider Care Teams.  These Care Teams include your primary Cardiologist (physician) and Advanced Practice Providers (APPs -  Physician Assistants and Nurse Practitioners) who all work together to provide you with the care you need, when you need it.     Your next appointment:   6 month(s)  The format for your next appointment:   In Person  Provider:   Glenetta Hew, MD       Glenetta Hew, M.D., M.S. Interventional Cardiologist   Pager # [phone] Phone # [phone] [address] [place], [place] [address]   Thank you for choosing Heartcare at [facility]!!

## 2021-08-06 ENCOUNTER — Ambulatory Visit (INDEPENDENT_AMBULATORY_CARE_PROVIDER_SITE_OTHER): Payer: Medicare Other | Admitting: Family Medicine

## 2021-08-06 ENCOUNTER — Encounter: Payer: Self-pay | Admitting: Family Medicine

## 2021-08-06 VITALS — BP 108/68 | HR 67 | Temp 98.3°F | Resp 16 | Ht 60.0 in | Wt 141.2 lb

## 2021-08-06 DIAGNOSIS — R590 Localized enlarged lymph nodes: Secondary | ICD-10-CM | POA: Diagnosis not present

## 2021-08-06 DIAGNOSIS — R5383 Other fatigue: Secondary | ICD-10-CM

## 2021-08-06 NOTE — Patient Instructions (Signed)
A few things to remember from today's visit:  Fatigue, unspecified type - Plan: Basic metabolic panel, CBC, TSH  Mediastinal lymphadenopathy  If you need refills please call your pharmacy. Do not use My Chart to request refills or for acute issues that need immediate attention.  Fatigue If you have fatigue, you feel tired all the time and have a lack of energy or a lack of motivation. Fatigue may make it difficult to start or complete tasks because of exhaustion. Occasional or mild fatigue is often a normal response to activity or life. However, long-term (chronic) or extreme fatigue may be a symptom of a medical condition such as: Depression. Not having enough red blood cells or hemoglobin in the blood (anemia). A problem with a small gland located in the lower front part of the neck (thyroid disorder). Rheumatologic conditions. These are problems related to the body's defense system (immune system). Infections, especially certain viral infections. Fatigue can also lead to negative health outcomes over time. Follow these instructions at home: Medicines Take over-the-counter and prescription medicines only as told by your health care provider. Take a multivitamin if told by your health care provider. Do not use herbal or dietary supplements unless they are approved by your health care provider. Eating and drinking  Avoid heavy meals in the evening. Eat a well-balanced diet, which includes lean proteins, whole grains, plenty of fruits and vegetables, and low-fat dairy products. Avoid eating or drinking too many products with caffeine in them. Avoid alcohol. Drink enough fluid to keep your urine pale yellow. Activity  Exercise regularly, as told by your health care provider. Use or practice techniques to help you relax, such as yoga, tai chi, meditation, or massage therapy. Lifestyle Change situations that cause you stress. Try to keep your work and personal schedules in balance. Do  not use recreational or illegal drugs. General instructions Monitor your fatigue for any changes. Go to bed and get up at the same time every day. Avoid fatigue by pacing yourself during the day and getting enough sleep at night. Maintain a healthy weight. Contact a health care provider if: Your fatigue does not get better. You have a fever. You suddenly lose or gain weight. You have headaches. You have trouble falling asleep or sleeping through the night. You feel angry, guilty, anxious, or sad. You have swelling in your legs or another part of your body. Get help right away if: You feel confused, feel like you might faint, or faint. Your vision is blurry or you have a severe headache. You have severe pain in your abdomen, your back, or the area between your waist and hips (pelvis). You have chest pain, shortness of breath, or an irregular or fast heartbeat. You are unable to urinate, or you urinate less than normal. You have abnormal bleeding from the rectum, nose, lungs, nipples, or, if you are female, the vagina. You vomit blood. You have thoughts about hurting yourself or others. These symptoms may be an emergency. Get help right away. Call 911. Do not wait to see if the symptoms will go away. Do not drive yourself to the hospital. Get help right away if you feel like you may hurt yourself or others, or have thoughts about taking your own life. Go to your nearest emergency room or: Call 911. Call the [facility] at [phone] or 988. This is open 24 hours a day. Text the Crisis Text Line at (408)525-5499. Summary If you have fatigue, you feel tired all  the time and have a lack of energy or a lack of motivation. Fatigue may make it difficult to start or complete tasks because of exhaustion. Long-term (chronic) or extreme fatigue may be a symptom of a medical condition. Exercise regularly, as told by your health care provider. Change situations that cause  you stress. Try to keep your work and personal schedules in balance. This information is not intended to replace advice given to you by your health care provider. Make sure you discuss any questions you have with your health care provider. Document Revised: 12/09/2020 Document Reviewed: 12/09/2020 Elsevier Patient Education  [place].   Please be sure medication list is accurate. If a new problem present, please set up appointment sooner than planned today.

## 2021-08-06 NOTE — Progress Notes (Signed)
ACUTE VISIT Chief Complaint  Patient presents with   Fatigue    Intermittently x6 weeks   HPI: Nichole Walters is a 71 y.o. female, who is here today complaining of 6 weeks of fatigue as described above. It is not every day. She has not identified exacerbating or alleviating factors. She is sleeping more and taking naps. Sleeps about 8-10 hours.  She was last seen on 01/01/21. Since her last visit she has followed with her cardiologist, Crestor 20 mg was started in 05/2021. She also started a new OTC supplement,Liga Complex.  Lab Results  Component Value Date   CHOL 182 01/01/2021   HDL 46.90 01/01/2021   LDLCALC 114 (H) 01/01/2021   TRIG 108.0 01/01/2021   CHOLHDL 4 01/01/2021   Lab Results  Component Value Date   HGBA1C 5.4 07/16/2016   No new stressors. He denies depression or anxiety denies symptoms. History of palpitation, unchanged for years. No known history of sleep apnea. "Sometimes" she does not feel rested when she first gets up in the morning. Negative for morning headaches.  She is concerned about enlarged lymph node seen on chest CT. Cardiac CT done on 07/07/2021 showed aortic atherosclerosis and a prominent mediastinal lymph node measuring 9 mm, thought to be reactive. She denies fever, abnormal weight loss, dysphagia, cough, CP, dyspnea, wheezing, or abdominal pain.  Review of Systems  Constitutional:  Negative for activity change and appetite change.  Gastrointestinal:  Negative for nausea and vomiting.       Negative for changes in bowel habits.  Genitourinary:  Negative for decreased urine volume, dysuria and hematuria.  Musculoskeletal:  Negative for gait problem.  Skin:  Negative for rash.  Neurological:  Negative for syncope and weakness.  Rest see pertinent positives and negatives per HPI.  Current Outpatient Medications on File Prior to Visit  Medication Sig Dispense Refill   amLODipine (NORVASC) 5 MG tablet TAKE 1 TABLET (5 MG TOTAL) BY  MOUTH DAILY. 90 tablet 0   calcium carbonate (CALTRATE 600) 1500 (600 Ca) MG TABS tablet Take 600 mg of elemental calcium by mouth 2 (two) times daily with a meal.     latanoprost (XALATAN) 0.005 % ophthalmic solution Place 1 drop into both eyes at bedtime.     OVER THE COUNTER MEDICATION Liga-complex-take one tablet two to three times daily     rosuvastatin (CRESTOR) 20 MG tablet Take 1 tablet (20 mg total) by mouth at bedtime. 90 tablet 3   valsartan-hydrochlorothiazide (DIOVAN-HCT) 160-12.5 MG tablet Take 1 tablet by mouth daily. 90 tablet 3   No current facility-administered medications on file prior to visit.   Past Medical History:  Diagnosis Date   Anemia    Anxiety    no meds   COVID-19    2022   Dyslipidemia 05/08/2013   Essential hypertension 05/08/2013   Glaucoma    Palpitations 05/08/2013   Hx    SVD (spontaneous vaginal delivery)    x 1   Allergies  Allergen Reactions   Celexa [Citalopram] Swelling    Mouth swells   Social History   Socioeconomic History   Marital status: Married    Spouse name: Not on file   Number of children: Not on file   Years of education: Not on file   Highest education level: Not on file  Occupational History   Not on file  Tobacco Use   Smoking status: Never   Smokeless tobacco: Never  Vaping Use   Vaping Use: Never  used  Substance and Sexual Activity   Alcohol use: No   Drug use: No   Sexual activity: Not Currently    Birth control/protection: Post-menopausal  Other Topics Concern   Not on file  Social History Narrative   Single mother of one, grandmother 31, great-grandmother 2.   Does not smoke, does not drink.   Exercises usually on treadmill mostly 3 days a week.   Social Determinants of Health   Financial Resource Strain: Low Risk    Difficulty of Paying Living Expenses: Not hard at all  Food Insecurity: No Food Insecurity   Worried About Charity fundraiser in the Last Year: Never true   [place] in the  Last Year: Never true  Transportation Needs: No Transportation Needs   Lack of Transportation (Medical): No   Lack of Transportation (Non-Medical): No  Physical Activity: Sufficiently Active   Days of Exercise per Week: 5 days   Minutes of Exercise per Session: 30 min  Stress: No Stress Concern Present   Feeling of Stress : Not at all  Social Connections: Socially Integrated   Frequency of Communication with Friends and Family: More than three times a week   Frequency of Social Gatherings with Friends and Family: More than three times a week   Attends Religious Services: More than 4 times per year   Active Member of [employer] or Organizations: Yes   Attends Archivist Meetings: More than 4 times per year   Marital Status: Married   Vitals:   08/06/21 1550  BP: 108/68  Pulse: 67  Resp: 16  Temp: 98.3 F (36.8 C)  SpO2: 100%   Body mass index is 27.58 kg/m.  Physical Exam Vitals and nursing note reviewed.  Constitutional:      General: She is not in acute distress.    Appearance: She is well-developed.  HENT:     Head: Normocephalic and atraumatic.     Mouth/Throat:     Mouth: Mucous membranes are moist.     Pharynx: Oropharynx is clear.  Eyes:     Conjunctiva/sclera: Conjunctivae normal.  Cardiovascular:     Rate and Rhythm: Normal rate and regular rhythm.     Heart sounds: No murmur heard. Pulmonary:     Effort: Pulmonary effort is normal. No respiratory distress.     Breath sounds: Normal breath sounds.  Abdominal:     Palpations: Abdomen is soft. There is no hepatomegaly or mass.     Tenderness: There is no abdominal tenderness.  Lymphadenopathy:     Cervical: No cervical adenopathy.     Upper Body:     Right upper body: No supraclavicular adenopathy.     Left upper body: No supraclavicular adenopathy.  Skin:    General: Skin is warm.     Findings: No erythema or rash.  Neurological:     General: No focal deficit present.     Mental Status: She is  alert and oriented to person, place, and time.     Cranial Nerves: No cranial nerve deficit.     Gait: Gait normal.  Psychiatric:     Comments: Well groomed, good eye contact.   ASSESSMENT AND PLAN:  [name] was seen today for fatigue.  Diagnoses and all orders for this visit: Orders Placed This Encounter  Procedures   CT Chest Wo Contrast   Basic metabolic panel   CBC   TSH   Lab Results  Component Value Date   TSH 1.92  08/07/2021   Lab Results  Component Value Date   WBC 5.6 08/07/2021   HGB 12.2 08/07/2021   HCT 38.4 08/07/2021   MCV 80.5 08/07/2021   PLT 253.0 08/07/2021   Lab Results  Component Value Date   CREATININE 0.66 08/07/2021   BUN 15 08/07/2021   NA 139 08/07/2021   K 3.8 08/07/2021   CL 104 08/07/2021   CO2 28 08/07/2021   Fatigue, unspecified type We discussed possible etiologies: Systemic illness, immunologic,endocrinology,sleep disorder, psychiatric/psychologic, infectious,medications side effects, and idiopathic. Examination today does not suggest a serious process. Palpitation work up being done by cardiologist, bradycardia. Crestor could be a contributing factor, she could hold on med for a few weeks a monitor for changes. If persistent sleep study needs to be considered. She has mammogram scheduled for later this month. Rest of recommenced cancer screening for her age is reported as current. Has appt with her gyn in a few weeks. Further recommendations will be given according to lab results.  Mediastinal lymphadenopathy We discussed CT results. Most likely benign. We can repeat chest CT in 4 to 6 months.  I spent a total of 33 minutes in both face to face and non face to face activities for this visit on the date of this encounter. During this time history was obtained and documented, examination was performed, prior labs/imaging reviewed, and assessment/plan discussed.  Return if symptoms worsen or fail to improve, for Depending of lab  results..  Hiro Vipond G. [place], MD  [facility]. Central office.

## 2021-08-07 ENCOUNTER — Other Ambulatory Visit (INDEPENDENT_AMBULATORY_CARE_PROVIDER_SITE_OTHER): Payer: Medicare Other

## 2021-08-07 DIAGNOSIS — R5383 Other fatigue: Secondary | ICD-10-CM

## 2021-08-07 LAB — BASIC METABOLIC PANEL
BUN: 15 mg/dL (ref 6–23)
CO2: 28 mEq/L (ref 19–32)
Calcium: 9.7 mg/dL (ref 8.4–10.5)
Chloride: 104 mEq/L (ref 96–112)
Creatinine, Ser: 0.66 mg/dL (ref 0.40–1.20)
GFR: 89.81 mL/min (ref >60.00)
Glucose, Bld: 88 mg/dL (ref 70–99)
Potassium: 3.8 mEq/L (ref 3.5–5.1)
Sodium: 139 mEq/L (ref 135–145)

## 2021-08-07 LAB — CBC
HCT: 38.4 % (ref 36.0–46.0)
Hemoglobin: 12.2 g/dL (ref 12.0–15.0)
MCHC: 31.7 g/dL (ref 30.0–36.0)
MCV: 80.5 fl (ref 78.0–100.0)
Platelets: 253 10*3/uL (ref 150.0–400.0)
RBC: 4.77 Mil/uL (ref 3.87–5.11)
RDW: 15 % (ref 11.5–15.5)
WBC: 5.6 10*3/uL (ref 4.0–10.5)

## 2021-08-07 LAB — TSH: TSH: 1.92 u[IU]/mL (ref 0.35–5.50)

## 2021-08-09 ENCOUNTER — Encounter: Payer: Self-pay | Admitting: Cardiology

## 2021-08-09 NOTE — Assessment & Plan Note (Signed)
Now with Coronary Calcium Score of 200 all along with aortic atherosclerosis , LDL target less than 70

## 2021-08-09 NOTE — Assessment & Plan Note (Signed)
Not all that unexpected in a 71 year old with hyperlipidemia and hypertension. Plan continue cardiovascular risk reduction with guideline directed therapy.  Blood pressure and lipid control as per CAD with Coronary Calcium score of over 200.  Converted to rosuvastatin, on ARB and calcium channel blocker.  Has not been on beta-blocker because of history of bradycardia.

## 2021-08-09 NOTE — Assessment & Plan Note (Signed)
Relatively decent blood pressure control a combination of amlodipine and valsartan and HCTZ.  Borderline systolic 875.  Monitor.

## 2021-08-09 NOTE — Assessment & Plan Note (Signed)
Subcarinal lymph node enlargement seen on Coronary Calcium Score.  This is probably some that would be better evaluated with a dedicated chest CT.  Will defer to PCP, but may want to consider rechecking in 6 to 12 months.  Most recent findings seem to be potentially related to inflammatory condition.

## 2021-08-09 NOTE — Assessment & Plan Note (Signed)
We spent some time again discussing the physiology of atherosclerotic disease with error coronary arteries or the aorta.  We discussed timing and appropriateness of stress testing, indicating that in the absence of symptoms, stress test is not always warranted.  Plan: Continue with aggressive risk factor modification having increased statin to rosuvastatin 20.  Lipid levels need to be rechecked in about 2-3 months (after having converted to rosuvastatin).  Level is not quite high enough to require aspirin.  Somewhat borderline.  For now, we will hold off, but could consider every other day dosing.

## 2021-08-09 NOTE — Assessment & Plan Note (Signed)
Still having palpitations spells off and on.  I think this started to bother her a little bit more than usual.  Given the Coronary Calcium Score and her anxiety levels we will check a Zio patch monitor to ensure no arrhythmias.

## 2021-08-09 NOTE — Assessment & Plan Note (Signed)
She has had a stable valvular heart rate.  And on a beta-blocker.

## 2021-08-18 ENCOUNTER — Telehealth: Payer: Self-pay | Admitting: Cardiology

## 2021-08-18 NOTE — Telephone Encounter (Signed)
Rosuvastatin rx'ed at 05/2021 appointment Routed to MD to review patient's concerns of SE and advise on plan  Per chart review, she has also tried pravastatin in the past

## 2021-08-18 NOTE — Telephone Encounter (Signed)
Pt c/o medication issue:  1. Name of Medication: rosuvastatin (CRESTOR) 20 MG tablet  2. How are you currently taking this medication (dosage and times per day)? Not currently taking medication   3. Are you having a reaction (difficulty breathing--STAT)? Yes  4. What is your medication issue? Patient states this medication is making her really sleepy and tired. PCP advised patient to stop taking the medication due to it causing this reactions for another pt of hers. Patient has not taken medication in a week and does not feel as tired.

## 2021-08-19 ENCOUNTER — Telehealth: Payer: Self-pay | Admitting: Family Medicine

## 2021-08-19 NOTE — Telephone Encounter (Signed)
Spoke to patient . Informed patient Dr Ellyn Hack not in office.    Patient voiced understanding.and staes she not taking anything at present time   Patient aware Dr Ellyn Hack may want her  to reduce the dose the dose  and work her way back up to a tolerated dose.

## 2021-08-19 NOTE — Telephone Encounter (Signed)
Pt called to say she would like to stop taking the rosuvastatin (CRESTOR) 20 MG tablet  Because she states it makes her too sleepy. She would like to go back to the Provastatin [id number] [place] Please advise.

## 2021-08-19 NOTE — Telephone Encounter (Signed)
Okay to change back.

## 2021-08-19 NOTE — Telephone Encounter (Signed)
Pt states disregard, she has spoken with cardiologist.

## 2021-08-19 NOTE — Telephone Encounter (Signed)
Noted  

## 2021-08-21 ENCOUNTER — Other Ambulatory Visit: Payer: Self-pay

## 2021-08-21 MED ORDER — ATORVASTATIN CALCIUM 10 MG PO TABS: 10.0000 mg | ORAL_TABLET | Freq: Every evening | ORAL | 2 refills | Status: DC

## 2021-08-21 NOTE — Telephone Encounter (Signed)
Pt calling to f/u on medication issue that she called about on 6/19. Please advise

## 2021-08-21 NOTE — Telephone Encounter (Signed)
Patient following up on question about crestor 20 mg daily. She said it makes her sleepy and want to know how to proceed.

## 2021-08-21 NOTE — Telephone Encounter (Signed)
Patient informed to stop taking rosuvastatin and to begin taking atorvastatin 10 mg each night. Prescription sent to her preferred pharmacy. Patient will contact clinic for any questions or concerns regarding new prescription.

## 2021-08-27 ENCOUNTER — Telehealth (HOSPITAL_BASED_OUTPATIENT_CLINIC_OR_DEPARTMENT_OTHER): Payer: Self-pay

## 2021-08-27 ENCOUNTER — Other Ambulatory Visit (HOSPITAL_BASED_OUTPATIENT_CLINIC_OR_DEPARTMENT_OTHER): Payer: Self-pay | Admitting: Obstetrics & Gynecology

## 2021-08-27 DIAGNOSIS — Z1231 Encounter for screening mammogram for malignant neoplasm of breast: Secondary | ICD-10-CM | POA: Diagnosis not present

## 2021-08-27 LAB — HM MAMMOGRAPHY

## 2021-09-01 ENCOUNTER — Encounter: Payer: Self-pay | Admitting: Family Medicine

## 2021-10-23 ENCOUNTER — Ambulatory Visit (INDEPENDENT_AMBULATORY_CARE_PROVIDER_SITE_OTHER): Payer: Medicare Other

## 2021-10-23 ENCOUNTER — Encounter: Payer: Self-pay | Admitting: Adult Health

## 2021-10-23 ENCOUNTER — Ambulatory Visit (INDEPENDENT_AMBULATORY_CARE_PROVIDER_SITE_OTHER): Payer: Medicare Other | Admitting: Adult Health

## 2021-10-23 VITALS — BP 110/60 | HR 70 | Temp 98.5°F | Ht 60.0 in | Wt 139.0 lb

## 2021-10-23 DIAGNOSIS — M2170 Unequal limb length (acquired), unspecified site: Secondary | ICD-10-CM

## 2021-10-23 DIAGNOSIS — G8929 Other chronic pain: Secondary | ICD-10-CM

## 2021-10-23 DIAGNOSIS — M545 Low back pain, unspecified: Secondary | ICD-10-CM

## 2021-10-23 NOTE — Progress Notes (Signed)
Subjective:    Patient ID: Nichole Walters, female    DOB: January 16, 1951, 71 y.o.   MRN: [id number]  HPI 71 year old female who  has a past medical history of Anemia, Anxiety, COVID-19, Dyslipidemia (05/08/2013), Essential hypertension (05/08/2013), Glaucoma, Palpitations (05/08/2013), and SVD (spontaneous vaginal delivery).  She presents to the office today for an acute issue of low back pain and unstable gait. Symptoms started a few days ago after she wore a spandex girdle. Since that time she has had left lower back pain. Pain is across the entire left lower back. Pain is constant. She has been using tylenol without relief. 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 She does not know where the lifts are.    Review of Systems See HPI   Past Medical History:  Diagnosis Date   Anemia    Anxiety    no meds   COVID-19    2022   Dyslipidemia 05/08/2013   Essential hypertension 05/08/2013   Glaucoma    Palpitations 05/08/2013   Hx    SVD (spontaneous vaginal delivery)    x 1    Social History   Socioeconomic History   Marital status: Married    Spouse name: Not on file   Number of children: Not on file   Years of education: Not on file   Highest education level: 12th grade  Occupational History   Not on file  Tobacco Use   Smoking status: Never   Smokeless tobacco: Never  Vaping Use   Vaping Use: Never used  Substance and Sexual Activity   Alcohol use: No   Drug use: No   Sexual activity: Not Currently    Birth control/protection: Post-menopausal  Other Topics Concern   Not on file  Social History Narrative   Single mother of one, grandmother 64, great-grandmother 2.   Does not smoke, does not drink.   Exercises usually on treadmill mostly 3 days a week.   Social Determinants of  Health   Financial Resource Strain: Low Risk  (10/22/2021)   Overall Financial Resource Strain (CARDIA)    Difficulty of Paying Living Expenses: Not hard at all  Food Insecurity: No Food Insecurity (10/22/2021)   Hunger Vital Sign    Worried About Running Out of Food in the Last Year: Never true    Ran Out of Food in the Last Year: Never true  Transportation Needs: No Transportation Needs (10/22/2021)   PRAPARE - Hydrologist (Medical): No    Lack of Transportation (Non-Medical): No  Physical Activity: Sufficiently Active (10/22/2021)   Exercise Vital Sign    Days of Exercise per Week: 4 days    Minutes of Exercise per Session: 40 min  Stress: No Stress Concern Present (10/22/2021)   [place]    Feeling of Stress : Not at all  Social Connections: [place] (10/22/2021)   Social Connection and Isolation Panel [NHANES]    Frequency of Communication with Friends and Family: More than three times a week    Frequency of Social Gatherings with Friends and Family: More than three times a week    Attends Religious Services: More than 4 times per year  Active Member of Clubs or Organizations: Yes    Attends Archivist Meetings: More than 4 times per year    Marital Status: Married  Human resources officer Violence: Not At Risk (03/20/2021)   Humiliation, Afraid, Rape, and Kick questionnaire    Fear of Current or Ex-Partner: No    Emotionally Abused: No    Physically Abused: No    Sexually Abused: No    Past Surgical History:  Procedure Laterality Date   CARDIAC CATHETERIZATION  04/25/2001   EF>60%   COLONOSCOPY     DOPPLER ECHOCARDIOGRAPHY  02/13/2009   EF>55%   EXCISION MASS NECK     HYSTEROSCOPY N/A 07/22/2015   Procedure: HYSTEROSCOPY;  Surgeon: Alden Hipp, MD;  Location: [facility];  Service: Gynecology;  Laterality: N/A;   NM MYOVIEW LTD  02/13/2009   EF 70%  Low risk  scan   NM PERSANTINE MYOVIEW LTD  01/30/2009   EF> 70%. No ischemia or infarction   ROBOTIC ASSISTED TOTAL HYSTERECTOMY WITH BILATERAL SALPINGO OOPHERECTOMY N/A 10/22/2020   Procedure: XI ROBOTIC ASSISTED TOTAL HYSTERECTOMY WITH BILATERAL SALPINGO OOPHORECTOMY;  Surgeon: Everitt Amber, MD;  Location: [facility];  Service: Gynecology;  Laterality: N/A;   TOE SURGERY     TRANSTHORACIC ECHOCARDIOGRAM  01/30/2009   Normal LV size and function. Mild MR and TR. Otherwise normal. Normal diastolic function.   WISDOM TOOTH EXTRACTION      Family History  Problem Relation Age of Onset   Colon cancer Sister    Colon cancer Brother    Colon cancer Cousin     Allergies  Allergen Reactions   Citalopram Swelling and Other (See Comments)    Mouth swells    Current Outpatient Medications on File Prior to Visit  Medication Sig Dispense Refill   amLODipine (NORVASC) 5 MG tablet TAKE 1 TABLET (5 MG TOTAL) BY MOUTH DAILY. 90 tablet 0   atorvastatin (LIPITOR) 10 MG tablet Take 1 tablet (10 mg total) by mouth at bedtime. 30 tablet 2   calcium carbonate (CALTRATE 600) 1500 (600 Ca) MG TABS tablet Take 600 mg of elemental calcium by mouth 2 (two) times daily with a meal.     latanoprost (XALATAN) 0.005 % ophthalmic solution Place 1 drop into both eyes at bedtime.     OVER THE COUNTER MEDICATION Liga-complex-take one tablet two to three times daily     valsartan-hydrochlorothiazide (DIOVAN-HCT) 160-12.5 MG tablet Take 1 tablet by mouth daily. 90 tablet 3   No current facility-administered medications on file prior to visit.    BP 110/60   Pulse 70   Temp 98.5 F (36.9 C) (Oral)   Ht 5' (1.524 m)   Wt 139 lb (63 kg)   SpO2 98%   BMI 27.15 kg/m       Objective:   Physical Exam Vitals and nursing note reviewed.  Constitutional:      Appearance: Normal appearance.  Musculoskeletal:        General: No tenderness.  Skin:    General: Skin is warm and dry.  Neurological:     General: No focal  deficit present.     Mental Status: She is alert and oriented to person, place, and time.     Gait: Gait abnormal (she does have steady gait but slightly lean to right).  Psychiatric:        Mood and Affect: Mood normal.        Behavior: Behavior normal.        Thought Content:  Thought content normal.        Judgment: Judgment normal.       Assessment & Plan:  1. Chronic left-sided low back pain without sciatica  - DG Lumbar Spine Complete; Future  2. Acquired unequal limb length  - Ambulatory referral to [place], NP

## 2021-10-29 ENCOUNTER — Other Ambulatory Visit: Payer: Self-pay | Admitting: Family Medicine

## 2021-11-06 ENCOUNTER — Ambulatory Visit: Payer: Medicare Other | Admitting: Podiatry

## 2021-11-17 ENCOUNTER — Telehealth: Payer: Self-pay | Admitting: Cardiology

## 2021-11-17 ENCOUNTER — Telehealth: Payer: Self-pay | Admitting: Family Medicine

## 2021-11-17 DIAGNOSIS — H401132 Primary open-angle glaucoma, bilateral, moderate stage: Secondary | ICD-10-CM | POA: Diagnosis not present

## 2021-11-17 MED ORDER — ATORVASTATIN CALCIUM 10 MG PO TABS: 10.0000 mg | ORAL_TABLET | Freq: Every evening | ORAL | 6 refills | Status: DC

## 2021-11-17 NOTE — Telephone Encounter (Signed)
She already has orders in, just needs to call her cardiologist office.

## 2021-11-17 NOTE — Telephone Encounter (Signed)
*  STAT* If patient is at the pharmacy, call can be transferred to refill team.   1. Which medications need to be refilled? (please list name of each medication and dose if known)   atorvastatin (LIPITOR) 10 MG tablet    2. Which pharmacy/location (including street and city if local pharmacy) is medication to be sent to?  [id number] [place] [place] [address]:  [phone]        3. Do they need a 30 day or 90 day supply?  30 day

## 2021-11-17 NOTE — Telephone Encounter (Signed)
Pt called to request lab work. Pt states she has high cholesterol.  Pt was prescribed rosuvastatin (CRESTOR) 20 MG tablet, but states it makes her too sleepy.   Pt states this Rx was replaced with atorvastatin (LIPITOR) 10 MG tablet.  Pt states that while she no longer is so sleepy, she still wants blood work to make sure the atorvastatin is working.  Please advise.

## 2021-11-18 NOTE — Telephone Encounter (Addendum)
Called Pt at 11:45 am on 11/18/21 to inform her of previous message. Pt understood and will call [place] at [facility]  [phone]) to schedule for lab work.

## 2021-11-21 ENCOUNTER — Telehealth: Payer: Self-pay | Admitting: Cardiology

## 2021-11-21 NOTE — Telephone Encounter (Signed)
Patient c/o Palpitations:  High priority if patient c/o lightheadedness, shortness of breath, or chest pain  How long have you had palpitations/irregular HR/ Afib? Are you having the symptoms now? Have been more frequent within the last month, not having the palpitations now  Are you currently experiencing lightheadedness, SOB or CP? Lightheadedness   Do you have a history of afib (atrial fibrillation) or irregular heart rhythm? Yes   Have you checked your BP or HR? (document readings if available): HR 66 has not checked BP, but has BP cuff in car so she can check it.   Are you experiencing any other symptoms? Lightheadedness   Palpitations have been more frequent and occurring in the day when they previously only normally occurred at night.

## 2021-11-21 NOTE — Telephone Encounter (Addendum)
Received stat call from patient who states that she has been having some dizziness and lightheadedness today. Patient reports that she was slightly unstable on her feet but states that she does not feel like she is going to pass out. Patient feels like she is not well hydrated. Patient unable to provide BP readings or HR readings. Patient states that she is out of town at the moment as well. Patient is scheduled to wear heart monitor the first week of October. Patient states that she feels a lot of this is due to being very worked up at the moment. Advised patient to increase hydration, to relax and check BP in a little bit and to call back with results. Offered to make patient an appointment next week but patient declined. Made patient aware of ED precautions should new or worsening symptoms develop. Patient verbalized understanding.    Will forward to MD to make aware.

## 2021-11-26 NOTE — Telephone Encounter (Signed)
Agree with recommendations.  Amore Grater, MD  

## 2021-12-05 DIAGNOSIS — R931 Abnormal findings on diagnostic imaging of heart and coronary circulation: Secondary | ICD-10-CM | POA: Diagnosis not present

## 2021-12-05 DIAGNOSIS — E785 Hyperlipidemia, unspecified: Secondary | ICD-10-CM | POA: Diagnosis not present

## 2021-12-06 LAB — COMPREHENSIVE METABOLIC PANEL
ALT: 14 IU/L (ref 0–32)
AST: 21 IU/L (ref 0–40)
Albumin/Globulin Ratio: 1.4 (ref 1.2–2.2)
Albumin: 4.3 g/dL (ref 3.9–4.9)
Alkaline Phosphatase: 69 IU/L (ref 44–121)
BUN/Creatinine Ratio: 17 (ref 12–28)
BUN: 12 mg/dL (ref 8–27)
Bilirubin Total: 0.4 mg/dL (ref 0.0–1.2)
CO2: 24 mmol/L (ref 20–29)
Calcium: 9.4 mg/dL (ref 8.7–10.3)
Chloride: 104 mmol/L (ref 96–106)
Creatinine, Ser: 0.72 mg/dL (ref 0.57–1.00)
Globulin, Total: 3.1 g/dL (ref 1.5–4.5)
Glucose: 83 mg/dL (ref 70–99)
Potassium: 4.1 mmol/L (ref 3.5–5.2)
Sodium: 142 mmol/L (ref 134–144)
Total Protein: 7.4 g/dL (ref 6.0–8.5)
eGFR: 90 mL/min/{1.73_m2} (ref >59)

## 2021-12-06 LAB — LIPID PANEL
Chol/HDL Ratio: 2.9 ratio (ref 0.0–4.4)
Cholesterol, Total: 151 mg/dL (ref 100–199)
HDL: 52 mg/dL (ref >39)
LDL Chol Calc (NIH): 86 mg/dL (ref 0–99)
Triglycerides: 63 mg/dL (ref 0–149)
VLDL Cholesterol Cal: 13 mg/dL (ref 5–40)

## 2022-01-01 DIAGNOSIS — R001 Bradycardia, unspecified: Secondary | ICD-10-CM | POA: Diagnosis not present

## 2022-01-01 DIAGNOSIS — R002 Palpitations: Secondary | ICD-10-CM | POA: Diagnosis not present

## 2022-01-16 NOTE — Progress Notes (Signed)
HPI: Nichole Walters is a 71 y.o. female with medical hx significant for HLD, HTN,aortic atherosclerosis, PAD, vit D def,and back pain here today for her routine physical.  Last CPE: 01/01/21 She exercises regularly, walking for 30-40 minutes daily, and maintains a diet consisting of more vegetables, chicken, and fish. [name] denies smoking and alcohol consumption. She reports sleeping an average of six hours per night  She inquires about the need for blood work and a tuberculosis (TB) screening She reports that she needs to be screened for a potential job in patient care  Immunization History  Administered Date(s) Administered   Hep A / Hep B 03/09/2017, 04/12/2017, 09/06/2017   PFIZER Comirnaty(Gray Top)Covid-19 Tri-Sucrose Vaccine 04/24/2019, 05/15/2019, 12/04/2019   Pfizer Covid-19 Vaccine Bivalent Booster 40yr & up 02/26/2021   Pneumococcal Conjugate-13 09/06/2017   Pneumococcal Polysaccharide-23 12/28/2018   Tdap 07/17/2016   Zoster Recombinat (Shingrix) 04/01/2021, 07/19/2021   Health Maintenance  Topic Date Due   Medicare Annual Wellness (AWV)  03/20/2022   COVID-19 Vaccine (5 - 2023-24 season) 02/04/2022 (Originally 10/31/2021)   MAMMOGRAM  08/28/2023   COLONOSCOPY (Pts 45-466yrInsurance coverage will need to be confirmed)  07/31/2025   Pneumonia Vaccine [age]Years old  Completed   DEXA SCAN  Completed   Hepatitis C Screening  Completed   Zoster Vaccines- Shingrix  Completed   HPV VACCINES  Aged Out   INFLUENZA VACCINE  Discontinued  She takes calcium with vitamin D supplements.  She follows with cardiologist regularly. HLD on Atorvastatin 10 mg daily.  Lab Results  Component Value Date   CHOL 151 12/05/2021   HDL 52 12/05/2021   LDLCALC 86 12/05/2021   TRIG 63 12/05/2021   CHOLHDL 2.9 12/05/2021   HTN on Amlodipine 5 mg daily and Valsartan-HCTZ 160-12.5 mg daily.  Lab Results  Component Value Date   CREATININE 0.72 12/05/2021   BUN 12 12/05/2021    NA 142 12/05/2021   K 4.1 12/05/2021   CL 104 12/05/2021   CO2 24 12/05/2021   Review of Systems  Constitutional:  Negative for appetite change and fever.  HENT:  Negative for hearing loss, mouth sores and sore throat.   Eyes:  Negative for redness and visual disturbance.  Respiratory:  Negative for cough, shortness of breath and wheezing.   Cardiovascular:  Negative for chest pain and leg swelling.  Gastrointestinal:  Negative for abdominal pain, nausea and vomiting.       No changes in bowel habits.  Endocrine: Negative for cold intolerance, heat intolerance, polydipsia, polyphagia and polyuria.  Genitourinary:  Negative for decreased urine volume, dysuria, hematuria, vaginal bleeding and vaginal discharge.  Musculoskeletal:  Negative for gait problem and myalgias.  Skin:  Negative for color change and rash.  Allergic/Immunologic: Negative for environmental allergies.  Neurological:  Negative for syncope, weakness and headaches.  Hematological:  Negative for adenopathy. Does not bruise/bleed easily.  Psychiatric/Behavioral:  Negative for confusion.   All other systems reviewed and are negative.  Current Outpatient Medications on File Prior to Visit  Medication Sig Dispense Refill   amLODipine (NORVASC) 5 MG tablet TAKE 1 TABLET (5 MG TOTAL) BY MOUTH DAILY. 90 tablet 0   atorvastatin (LIPITOR) 10 MG tablet Take 1 tablet (10 mg total) by mouth at bedtime. 30 tablet 6   calcium carbonate (CALTRATE 600) 1500 (600 Ca) MG TABS tablet Take 600 mg of elemental calcium by mouth 2 (two) times daily with a meal.     latanoprost (XALATAN) 0.005 %  ophthalmic solution Place 1 drop into both eyes at bedtime.     OVER THE COUNTER MEDICATION Liga-complex-take one tablet two to three times daily     valsartan-hydrochlorothiazide (DIOVAN-HCT) 160-12.5 MG tablet Take 1 tablet by mouth daily. 90 tablet 3   No current facility-administered medications on file prior to visit.   Past Medical History:   Diagnosis Date   Anemia    Anxiety    no meds   COVID-19    2022   Dyslipidemia 05/08/2013   Essential hypertension 05/08/2013   Glaucoma    Palpitations 05/08/2013   Hx    SVD (spontaneous vaginal delivery)    x 1   Past Surgical History:  Procedure Laterality Date   CARDIAC CATHETERIZATION  04/25/2001   EF>60%   COLONOSCOPY     DOPPLER ECHOCARDIOGRAPHY  02/13/2009   EF>55%   EXCISION MASS NECK     HYSTEROSCOPY N/A 07/22/2015   Procedure: HYSTEROSCOPY;  Surgeon: Alden Hipp, MD;  Location: [facility];  Service: Gynecology;  Laterality: N/A;   NM MYOVIEW LTD  02/13/2009   EF 70%  Low risk scan   NM PERSANTINE MYOVIEW LTD  01/30/2009   EF> 70%. No ischemia or infarction   ROBOTIC ASSISTED TOTAL HYSTERECTOMY WITH BILATERAL SALPINGO OOPHERECTOMY N/A 10/22/2020   Procedure: XI ROBOTIC ASSISTED TOTAL HYSTERECTOMY WITH BILATERAL SALPINGO OOPHORECTOMY;  Surgeon: Everitt Amber, MD;  Location: [facility];  Service: Gynecology;  Laterality: N/A;   TOE SURGERY     TRANSTHORACIC ECHOCARDIOGRAM  01/30/2009   Normal LV size and function. Mild MR and TR. Otherwise normal. Normal diastolic function.   WISDOM TOOTH EXTRACTION     Allergies  Allergen Reactions   Citalopram Swelling and Other (See Comments)    Mouth swells   Family History  Problem Relation Age of Onset   Colon cancer Sister    Colon cancer Brother    Colon cancer Cousin    Social History   Socioeconomic History   Marital status: Married    Spouse name: Not on file   Number of children: Not on file   Years of education: Not on file   Highest education level: 12th grade  Occupational History   Not on file  Tobacco Use   Smoking status: Never   Smokeless tobacco: Never  Vaping Use   Vaping Use: Never used  Substance and Sexual Activity   Alcohol use: No   Drug use: No   Sexual activity: Not Currently    Birth control/protection: Post-menopausal  Other Topics Concern   Not on file  Social History Narrative    Single mother of one, grandmother 68, great-grandmother 2.   Does not smoke, does not drink.   Exercises usually on treadmill mostly 3 days a week.   Social Determinants of Health   Financial Resource Strain: Low Risk  (10/22/2021)   Overall Financial Resource Strain (CARDIA)    Difficulty of Paying Living Expenses: Not hard at all  Food Insecurity: No Food Insecurity (10/22/2021)   Hunger Vital Sign    Worried About Running Out of Food in the Last Year: Never true    Ran Out of Food in the Last Year: Never true  Transportation Needs: No Transportation Needs (10/22/2021)   PRAPARE - Hydrologist (Medical): No    Lack of Transportation (Non-Medical): No  Physical Activity: Sufficiently Active (10/22/2021)   Exercise Vital Sign    Days of Exercise per Week: 4 days  Minutes of Exercise per Session: 40 min  Stress: No Stress Concern Present (10/22/2021)   [place]    Feeling of Stress : Not at all  Social Connections: [place] (10/22/2021)   Social Connection and Isolation Panel [NHANES]    Frequency of Communication with Friends and Family: More than three times a week    Frequency of Social Gatherings with Friends and Family: More than three times a week    Attends Religious Services: More than 4 times per year    Active Member of Clubs or Organizations: Yes    Attends Archivist Meetings: More than 4 times per year    Marital Status: Married   Vitals:   01/19/22 0907  BP: 124/80  Pulse: 71  Resp: 16  Temp: 98.6 F (37 C)  SpO2: 98%  Body mass index is 27.2 kg/m. Wt Readings from Last 3 Encounters:  01/19/22 139 lb 4 oz (63.2 kg)  10/23/21 139 lb (63 kg)  08/06/21 141 lb 3.2 oz (64 kg)   Physical Exam Vitals and nursing note reviewed.  Constitutional:      General: She is not in acute distress.    Appearance: She is well-developed.  HENT:     Head:  Normocephalic and atraumatic.     Right Ear: Hearing, tympanic membrane, ear canal and external ear normal.     Left Ear: Hearing, tympanic membrane, ear canal and external ear normal.     Mouth/Throat:     Mouth: Mucous membranes are moist.     Pharynx: Oropharynx is clear. Uvula midline.  Eyes:     Extraocular Movements: Extraocular movements intact.     Conjunctiva/sclera: Conjunctivae normal.     Pupils: Pupils are equal, round, and reactive to light.  Neck:     Thyroid: Thyromegaly (? Right thyroid nodule.) present.     Trachea: No tracheal deviation.  Cardiovascular:     Rate and Rhythm: Normal rate and regular rhythm.     Pulses:          Dorsalis pedis pulses are 2+ on the right side and 2+ on the left side.     Heart sounds: No murmur heard. Pulmonary:     Effort: Pulmonary effort is normal. No respiratory distress.     Breath sounds: Normal breath sounds.  Abdominal:     Palpations: Abdomen is soft. There is no hepatomegaly or mass.     Tenderness: There is no abdominal tenderness.  Musculoskeletal:     Comments: No signs of synovitis appreciated.  Lymphadenopathy:     Cervical: No cervical adenopathy.     Upper Body:     Right upper body: No supraclavicular adenopathy.     Left upper body: No supraclavicular adenopathy.  Skin:    General: Skin is warm.     Findings: No erythema or rash.  Neurological:     General: No focal deficit present.     Mental Status: She is alert and oriented to person, place, and time.     Cranial Nerves: No cranial nerve deficit.     Coordination: Coordination normal.     Gait: Gait normal.     Deep Tendon Reflexes:     Reflex Scores:      Bicep reflexes are 2+ on the right side and 2+ on the left side.      Patellar reflexes are 2+ on the right side and 2+ on the left side. Psychiatric:  Mood and Affect: Mood and affect normal.   ASSESSMENT AND PLAN: Ms. Torah Pinnock was here today annual physical  examination.  Routine general medical examination at a health care facility Assessment & Plan: We discussed the importance of regular physical activity and healthy diet for prevention of chronic illness and/or complications. Preventive guidelines reviewed. Vaccination up to date. Ca++ and vit D supplementation to continue. Next CPE in a year.   Essential hypertension Assessment & Plan: Adequately controlled. No changes in Amlodipine, Irbesartan,or HCTZ dose. Continue low-salt diet. Eye exam is current. Continue monitoring BP regularly. Follows with cardiologist, so I will see her in a year, before if needed.   Screening-pulmonary TB -     QuantiFERON-TB Gold Plus; Future  Enlarged thyroid gland Assessment & Plan: ? Of right thyroid nodule. Last TSH normal at 1.9 in 07/2021. Thyroid US will be arranged.  Orders: -     US THYROID; Future  Hyperlipidemia with target LDL less than 70 Assessment & Plan: Continue Atorvastatin 10 mg daily and low fat diet. Following with cardiologist.   Return in 1 year (on 01/20/2023) for CPE, chronic problems.  Sherise Geerdes G. [place], MD  [facility]. [employer] office.

## 2022-01-19 ENCOUNTER — Encounter: Payer: Self-pay | Admitting: Family Medicine

## 2022-01-19 ENCOUNTER — Ambulatory Visit (INDEPENDENT_AMBULATORY_CARE_PROVIDER_SITE_OTHER): Payer: Medicare Other | Admitting: Family Medicine

## 2022-01-19 VITALS — BP 124/80 | HR 71 | Temp 98.6°F | Resp 16 | Ht 60.0 in | Wt 139.2 lb

## 2022-01-19 DIAGNOSIS — Z0000 Encounter for general adult medical examination without abnormal findings: Secondary | ICD-10-CM | POA: Diagnosis not present

## 2022-01-19 DIAGNOSIS — Z111 Encounter for screening for respiratory tuberculosis: Secondary | ICD-10-CM | POA: Diagnosis not present

## 2022-01-19 DIAGNOSIS — E785 Hyperlipidemia, unspecified: Secondary | ICD-10-CM | POA: Diagnosis not present

## 2022-01-19 DIAGNOSIS — I10 Essential (primary) hypertension: Secondary | ICD-10-CM | POA: Diagnosis not present

## 2022-01-19 DIAGNOSIS — E049 Nontoxic goiter, unspecified: Secondary | ICD-10-CM | POA: Diagnosis not present

## 2022-01-19 NOTE — Assessment & Plan Note (Signed)
Adequately controlled. No changes in Amlodipine, Irbesartan,or HCTZ dose. Continue low-salt diet. Eye exam is current. Continue monitoring BP regularly. Follows with cardiologist, so I will see her in a year, before if needed.

## 2022-01-19 NOTE — Assessment & Plan Note (Signed)
?   Of right thyroid nodule. Last TSH normal at 1.9 in 07/2021. Thyroid US will be arranged.

## 2022-01-19 NOTE — Patient Instructions (Addendum)
A few things to remember from today's visit:  Routine general medical examination at a health care facility  Essential hypertension  Dyslipidemia  Screening-pulmonary TB - Plan: QuantiFERON-TB Gold Plus  Enlarged thyroid gland - Plan: US THYROID Please sing a release form , so we can obtain copy of last colonoscopy. I can see you back in a year as far as you see your cardiologist early next year. No changes today.  Preventive Care 45 Years and Older, Female Preventive care refers to lifestyle choices and visits with your health care provider that can promote health and wellness. Preventive care visits are also called wellness exams. What can I expect for my preventive care visit? Counseling Your health care provider may ask you questions about your: Medical history, including: Past medical problems. Family medical history. Pregnancy and menstrual history. History of falls. Current health, including: Memory and ability to understand (cognition). Emotional well-being. Home life and relationship well-being. Sexual activity and sexual health. Lifestyle, including: Alcohol, nicotine or tobacco, and drug use. Access to firearms. Diet, exercise, and sleep habits. Work and work Statistician. Sunscreen use. Safety issues such as seatbelt and bike helmet use. Physical exam Your health care provider will check your: Height and weight. These may be used to calculate your BMI (body mass index). BMI is a measurement that tells if you are at a healthy weight. Waist circumference. This measures the distance around your waistline. This measurement also tells if you are at a healthy weight and may help predict your risk of certain diseases, such as type 2 diabetes and high blood pressure. Heart rate and blood pressure. Body temperature. Skin for abnormal spots. What immunizations do I need?  Vaccines are usually given at various ages, according to a schedule. Your health care provider will  recommend vaccines for you based on your age, medical history, and lifestyle or other factors, such as travel or where you work. What tests do I need? Screening Your health care provider may recommend screening tests for certain conditions. This may include: Lipid and cholesterol levels. Hepatitis C test. Hepatitis B test. HIV (human immunodeficiency virus) test. STI (sexually transmitted infection) testing, if you are at risk. Lung cancer screening. Colorectal cancer screening. Diabetes screening. This is done by checking your blood sugar (glucose) after you have not eaten for a while (fasting). Mammogram. Talk with your health care provider about how often you should have regular mammograms. BRCA-related cancer screening. This may be done if you have a family history of breast, ovarian, tubal, or peritoneal cancers. Bone density scan. This is done to screen for osteoporosis. Talk with your health care provider about your test results, treatment options, and if necessary, the need for more tests. Follow these instructions at home: Eating and drinking  Eat a diet that includes fresh fruits and vegetables, whole grains, lean protein, and low-fat dairy products. Limit your intake of foods with high amounts of sugar, saturated fats, and salt. Take vitamin and mineral supplements as recommended by your health care provider. Do not drink alcohol if your health care provider tells you not to drink. If you drink alcohol: Limit how much you have to 0-1 drink a day. Know how much alcohol is in your drink. In the [place] one drink equals one 12 oz bottle of beer (355 mL), one 5 oz glass of wine (148 mL), or one 1 oz glass of hard liquor (44 mL). Lifestyle Brush your teeth every morning and night with fluoride toothpaste. Floss one time each day.  Exercise for at least 30 minutes 5 or more days each week. Do not use any products that contain nicotine or tobacco. These products include cigarettes,  chewing tobacco, and vaping devices, such as e-cigarettes. If you need help quitting, ask your health care provider. Do not use drugs. If you are sexually active, practice safe sex. Use a condom or other form of protection in order to prevent STIs. Take aspirin only as told by your health care provider. Make sure that you understand how much to take and what form to take. Work with your health care provider to find out whether it is safe and beneficial for you to take aspirin daily. Ask your health care provider if you need to take a cholesterol-lowering medicine (statin). Find healthy ways to manage stress, such as: Meditation, yoga, or listening to music. Journaling. Talking to a trusted person. Spending time with friends and family. Minimize exposure to UV radiation to reduce your risk of skin cancer. Safety Always wear your seat belt while driving or riding in a vehicle. Do not drive: If you have been drinking alcohol. Do not ride with someone who has been drinking. When you are tired or distracted. While texting. If you have been using any mind-altering substances or drugs. Wear a helmet and other protective equipment during sports activities. If you have firearms in your house, make sure you follow all gun safety procedures. What's next? Visit your health care provider once a year for an annual wellness visit. Ask your health care provider how often you should have your eyes and teeth checked. Stay up to date on all vaccines. This information is not intended to replace advice given to you by your health care provider. Make sure you discuss any questions you have with your health care provider. Document Revised: 08/14/2020 Document Reviewed: 08/14/2020 Elsevier Patient Education  [place].  If you need refills for medications you take chronically, please call your pharmacy. Do not use My Chart to request refills or for acute issues that need immediate attention. If you  send a my chart message, it may take a few days to be addressed, specially if I am not in the office.  Please be sure medication list is accurate. If a new problem present, please set up appointment sooner than planned today.

## 2022-01-23 DIAGNOSIS — Z0000 Encounter for general adult medical examination without abnormal findings: Secondary | ICD-10-CM | POA: Insufficient documentation

## 2022-01-23 NOTE — Assessment & Plan Note (Signed)
Continue Atorvastatin 10 mg daily and low fat diet. Following with cardiologist.

## 2022-01-23 NOTE — Assessment & Plan Note (Signed)
We discussed the importance of regular physical activity and healthy diet for prevention of chronic illness and/or complications. Preventive guidelines reviewed. Vaccination up to date. Ca++ and vit D supplementation to continue. Next CPE in a year. 

## 2022-01-24 ENCOUNTER — Other Ambulatory Visit: Payer: Self-pay | Admitting: Family Medicine

## 2022-01-25 LAB — QUANTIFERON-TB GOLD PLUS
Mitogen-NIL: >10 IU/mL
NIL: 0.06 IU/mL
QuantiFERON-TB Gold Plus: NEGATIVE
TB1-NIL: 0.02 IU/mL
TB2-NIL: 0.03 IU/mL

## 2022-01-28 ENCOUNTER — Telehealth: Payer: Self-pay | Admitting: Cardiology

## 2022-01-28 NOTE — Telephone Encounter (Signed)
STAT if HR is under 50 or over 120 (normal HR is 60-100 beats per minute)  What is your heart rate? 66  Do you have a log of your heart rate readings (document readings)? No  Do you have any other symptoms? No Pt states that HR has been between 55-59 this morning according to [employer]. Pt would like a callback regarding this matter. Please advise

## 2022-01-28 NOTE — Telephone Encounter (Signed)
Attempted to call patient, left message for patient to call back to office.   

## 2022-01-29 NOTE — Telephone Encounter (Signed)
Heart rate range of 55-63 is normal while at rest.  Would only be concerned if there are symptoms and usually that would not occur unless the heart rate is less than 40-45.   Glenetta Hew, MD

## 2022-01-29 NOTE — Telephone Encounter (Signed)
Pt returning call

## 2022-01-29 NOTE — Telephone Encounter (Signed)
Patient informed of Dr. Allison Quarry reply" Heart rate range of 55-63 is normal while at rest.  Would only be concerned if there are symptoms and usually that would not occur unless the heart rate is less than 40-45." Patient was grateful for the call.

## 2022-01-29 NOTE — Telephone Encounter (Signed)
Patient stated she was concerned because her heart rate was between 55-63. She denies SOB, lightheadedness, dizziness. While on phone (pt in bed) BP 138/73, P 364. Patient has 46-monthf/u appointment with Dr. HEllyn Hackon 12/6. Recommended to patient that if she has any symptoms as mentioned above, she is to call clinic. She verbalized understanding.

## 2022-02-02 ENCOUNTER — Ambulatory Visit
Admission: RE | Admit: 2022-02-02 | Discharge: 2022-02-02 | Disposition: A | Discharge status: Home / Self Care | Payer: Medicare Other | Source: Ambulatory Visit | Attending: Family Medicine | Admitting: Family Medicine

## 2022-02-02 DIAGNOSIS — E079 Disorder of thyroid, unspecified: Secondary | ICD-10-CM | POA: Diagnosis not present

## 2022-02-02 DIAGNOSIS — R221 Localized swelling, mass and lump, neck: Secondary | ICD-10-CM | POA: Diagnosis not present

## 2022-02-02 DIAGNOSIS — E049 Nontoxic goiter, unspecified: Secondary | ICD-10-CM

## 2022-02-04 ENCOUNTER — Encounter: Payer: Self-pay | Admitting: Cardiology

## 2022-02-04 ENCOUNTER — Ambulatory Visit: Payer: Medicare Other | Attending: Cardiology | Admitting: Cardiology

## 2022-02-04 VITALS — BP 125/70 | HR 65 | Ht 60.0 in | Wt 141.0 lb

## 2022-02-04 DIAGNOSIS — R931 Abnormal findings on diagnostic imaging of heart and coronary circulation: Secondary | ICD-10-CM

## 2022-02-04 DIAGNOSIS — R001 Bradycardia, unspecified: Secondary | ICD-10-CM

## 2022-02-04 DIAGNOSIS — I10 Essential (primary) hypertension: Secondary | ICD-10-CM | POA: Diagnosis not present

## 2022-02-04 DIAGNOSIS — R002 Palpitations: Secondary | ICD-10-CM | POA: Diagnosis not present

## 2022-02-04 DIAGNOSIS — E785 Hyperlipidemia, unspecified: Secondary | ICD-10-CM | POA: Diagnosis not present

## 2022-02-04 MED ORDER — ATORVASTATIN CALCIUM 10 MG PO TABS: 10.0000 mg | ORAL_TABLET | Freq: Every evening | ORAL | 3 refills | Status: DC

## 2022-02-04 NOTE — Telephone Encounter (Signed)
Patient had an appointment 02/04/22 with Dr Ellyn Hack

## 2022-02-04 NOTE — Patient Instructions (Addendum)

## 2022-02-04 NOTE — Progress Notes (Signed)
Primary Care Provider: [place], Betty Walters, [place] Cardiologist: Nichole Hew, MD Electrophysiologist: None  Clinic Note: Chief Complaint  Patient presents with   Follow-up    Reviewed monitor results.   Coronary Artery Disease    Coronary Calcium Score of 200.  No active symptoms of angina.   ===================================  ASSESSMENT/PLAN   Problem List Items Addressed This Visit       Cardiology Problems   Hyperlipidemia with target LDL less than 70 (Chronic)    Coronary Calcium Score is 200.  Should be less than 70, we have time to get better.  Continue to monitor.  Improving, will increase to 20 mg atorvastatin.  (We may need to go forward 40 versus convert to rosuvastatin 20 mg.)      Relevant Medications   atorvastatin (LIPITOR) 10 MG tablet   Agatston CAC score 200-399 (Chronic)    LDL is 86, would like it to be closer to 70, but at least it is less than 100.  Continue to monitor.  She seems to be tolerating current dose of 10 mg atorvastatin.  If levels do not go the right direction (down) and next check, would probably consider adding Zetia versus increasing to at least 20 if not 40 atorvastatin versus changing to 20 mg rosuvastatin.      Relevant Medications   atorvastatin (LIPITOR) 10 MG tablet   Essential hypertension (Chronic)    Blood pressure controlled on current meds.  No need to change-continue valsartan-HCTZ and amlodipine.  Not on beta-blocker because of resting heart rate in the 60s.      Relevant Medications   atorvastatin (LIPITOR) 10 MG tablet     Other   Slow heart rate (Chronic)    Have been having issues with low heart rates which is the reason why were not using beta-blocker.  Currently stable at 46.      Relevant Orders   EKG 12-Lead (Completed)   Palpitations - Primary (Chronic)    Much better controlled.  No longer present to the same extent.  Noting short little atrial runs-no significant PVCs or ventricular  runs..  Probably do not need to treat, if they become more more symptomatic, then we can probably consider low-dose beta-blocker.      Relevant Orders   EKG 12-Lead (Completed)   ===================================  HPI:    Nichole Walters is a 71 y.o. female with a PMH below who presents today for 54-monthfollow-up to discuss results of Zio patch monitor.  She returns today at the request of JMartinique Betty G, MD.  Nichole DibbleLAliviya Schoellerwas last seen on August 04, 2021 to discuss results of her Coronary Calcium Score => rosuvastatin has been increased to 20 mg daily.  She was noting off-and-on palpitations.  Zio patch ordered.  We also discussed her mediastinal lymph nodes.  Recent Hospitalizations: None  Reviewed  CV studies:    The following studies were reviewed today: (if available, images/films reviewed: From Epic Chart or Care Everywhere) Zio Patch Wear Time:  7 days and 7 hours    Predominant rhythm-Sinus: HR range 46-1 4 bpm. Avg 72 bpm.   Rare PACs and PVCs noted (<1%)) with some PAC couplets and triplets.   14 atrial runs-ranging from 4 to 11 beats.  Fastest was 4 beats with a max rate of 169 bpm, longest was 11 beats with an avg heart rate 120 bpm.   No Sustained Arrhythmias: Atrial Tachycardia (AT), Supraventricular Tachycardia (SVT), Atrial Fibrillation (  A-Fib), Atrial Flutter (A-Flutter), Sustained Ventricular Tachycardia (VT)   Symptoms noted mostly with PACs and atrial runs  07/08/2021 Coronary Calcium Score = 212, LM 144, LAD 68.  Noncardiac findings: Aortic atherosclerosis, small pericardial effusion.   Interval History:   Nichole Walters returns here today for follow-up doing well.  We reviewed the results of her monitor.  She still has intermittent palpitations that last documented time.  Not all that frequently and not overly symptomatic. Otherwise no episodes of chest pain or pressure with rest or exertion.  No PND, orthopnea or edema.  No  syncope or near CP, TIA symptoms experience or claudication.  Palpitation spells are very short-lived and relatively asymptomatic.  She was happy to hear the results.   REVIEWED OF SYSTEMS   Review of Systems  Constitutional:  Negative for malaise/fatigue (Some exercise intolerance but otherwise doing okay).  HENT:  Negative for congestion.   Respiratory:  Negative for cough and shortness of breath.   Gastrointestinal:  Negative for blood in stool and melena.  Genitourinary:  Negative for hematuria.  Musculoskeletal:  Positive for joint pain. Negative for myalgias.  Neurological:  Negative for dizziness and focal weakness.  Psychiatric/Behavioral:  Negative for depression and memory loss. The patient is nervous/anxious.   All other systems reviewed and are negative.   I have reviewed and (if needed) personally updated the patient's problem list, medications, allergies, past medical and surgical history, social and family history.   PAST MEDICAL HISTORY   Past Medical History:  Diagnosis Date   Anemia    Anxiety    no meds   Coronary artery calcification seen on CT scan 07/08/2021   Coronary Calcium Score = 212, LM 144, LAD 68.  Noncardiac findings: Aortic atherosclerosis, small pericardial effusion.   COVID-19    2022   Dyslipidemia 05/08/2013   Essential hypertension 05/08/2013   Glaucoma    Palpitations 05/08/2013   Hx    SVD (spontaneous vaginal delivery)    x 1     PAST SURGICAL HISTORY   Past Surgical History:  Procedure Laterality Date   COLONOSCOPY     EXCISION MASS NECK     HYSTEROSCOPY N/A 07/22/2015   Procedure: HYSTEROSCOPY;  Surgeon: Nichole Hipp, MD;  Location: [facility];  Service: Gynecology;  Laterality: N/A;   LEFT HEART CATH AND CORONARY ANGIOGRAPHY  04/25/2001   Dr. Rex Walters: Minimal irregularities in the LCx.  Intramyocardial segment of the LAD with tenting in the segment that becomes epicardial no fixed lesions; EF > 60%, ? MVP without MR.-FALSE  POSITIVE STRESS TEST   NM MYOVIEW LTD  02/13/2009   EF 70%  Low risk scan   NM PERSANTINE MYOVIEW LTD  01/30/2009   EF> 70%. No ischemia or infarction   ROBOTIC ASSISTED TOTAL HYSTERECTOMY WITH BILATERAL SALPINGO OOPHERECTOMY N/A 10/22/2020   Procedure: XI ROBOTIC ASSISTED TOTAL HYSTERECTOMY WITH BILATERAL SALPINGO OOPHORECTOMY;  Surgeon: Everitt Amber, MD;  Location: [facility];  Service: Gynecology;  Laterality: N/A;   TOE SURGERY     TRANSTHORACIC ECHOCARDIOGRAM  01/30/2009   Normal LV size and function. Mild MR and TR. Otherwise normal. Normal diastolic function.   WISDOM TOOTH EXTRACTION      Immunization History  Administered Date(s) Administered   Hep A / Hep B 03/09/2017, 04/12/2017, 09/06/2017   PFIZER Comirnaty(Gray Top)Covid-19 Tri-Sucrose Vaccine 04/24/2019, 05/15/2019, 12/04/2019   Pfizer Covid-19 Vaccine Bivalent Booster 64yr & up 02/26/2021   Pneumococcal Conjugate-13 09/06/2017   Pneumococcal Polysaccharide-23 12/28/2018   Tdap  07/17/2016   Zoster Recombinat (Shingrix) 04/01/2021, 07/19/2021    MEDICATIONS/ALLERGIES   Current Meds  Medication Sig   amLODipine (NORVASC) 5 MG tablet TAKE 1 TABLET (5 MG TOTAL) BY MOUTH DAILY.   atorvastatin (LIPITOR) 10 MG tablet Take 1 tablet (10 mg total) by mouth at bedtime.   calcium carbonate (CALTRATE 600) 1500 (600 Ca) MG TABS tablet Take 600 mg of elemental calcium by mouth 2 (two) times daily with a meal.   latanoprost (XALATAN) 0.005 % ophthalmic solution Place 1 drop into both eyes at bedtime.   OVER THE COUNTER MEDICATION Liga-complex-take one tablet two to three times daily   valsartan-hydrochlorothiazide (DIOVAN-HCT) 160-12.5 MG tablet Take 1 tablet by mouth daily.    Allergies  Allergen Reactions   Citalopram Swelling and Other (See Comments)    Mouth swells    SOCIAL HISTORY/FAMILY HISTORY   Reviewed in Epic:  Pertinent findings:  Social History   Tobacco Use   Smoking status: Never   Smokeless tobacco: Never   Vaping Use   Vaping Use: Never used  Substance Use Topics   Alcohol use: No   Drug use: No   Social History   Social History Narrative   Single mother of one, grandmother 87, great-grandmother 2.   Does not smoke, does not drink.   Exercises usually on treadmill mostly 3 days a week.    OBJCTIVE -PE, EKG, labs   Wt Readings from Last 3 Encounters:  02/04/22 141 lb (64 kg)  01/19/22 139 lb 4 oz (63.2 kg)  10/23/21 139 lb (63 kg)    Physical Exam: BP 125/70 (BP Location: Right Arm, Patient Position: Sitting, Cuff Size: Normal)   Pulse 65   Ht 5' (1.524 m)   Wt 141 lb (64 kg)   SpO2 98%   BMI 27.54 kg/m  Physical Exam Vitals reviewed.  Constitutional:      General: She is not in acute distress.    Appearance: Normal appearance. She is normal weight. She is not ill-appearing or toxic-appearing.  HENT:     Head: Normocephalic and atraumatic.  Neck:     Vascular: No carotid bruit.  Cardiovascular:     Rate and Rhythm: Normal rate and regular rhythm.     Pulses: Normal pulses.     Heart sounds: Normal heart sounds. No murmur heard.    No friction rub. No gallop.  Pulmonary:     Effort: Pulmonary effort is normal. No respiratory distress.     Breath sounds: Normal breath sounds. No wheezing, rhonchi or rales.  Musculoskeletal:        General: No swelling. Normal range of motion.     Cervical back: Normal range of motion and neck supple.  Skin:    General: Skin is warm and dry.  Neurological:     General: No focal deficit present.     Mental Status: She is alert and oriented to person, place, and time.     Gait: Gait normal.  Psychiatric:        Mood and Affect: Mood normal.        Behavior: Behavior normal.        Thought Content: Thought content normal.        Judgment: Judgment normal.     Adult ECG Report  Rate: 65 ;  Rhythm: normal sinus rhythm and normal axis, intervals and durations. ;   Narrative Interpretation: Stable  Recent Labs: Reviewed Lab  Results  Component Value Date   CHOL 151  12/05/2021   HDL 52 12/05/2021   LDLCALC 86 12/05/2021   TRIG 63 12/05/2021   CHOLHDL 2.9 12/05/2021   Lab Results  Component Value Date   CREATININE 0.72 12/05/2021   BUN 12 12/05/2021   NA 142 12/05/2021   K 4.1 12/05/2021   CL 104 12/05/2021   CO2 24 12/05/2021      Latest Ref Rng & Units 08/07/2021   11:14 AM 10/15/2020    1:34 PM 02/22/2017    9:25 AM  CBC  WBC 4.0 - 10.5 K/uL 5.6  4.9  5.1   Hemoglobin 12.0 - 15.0 Walters/dL 12.2  13.0  12.3   Hematocrit 36.0 - 46.0 % 38.4  41.5  38.1   Platelets 150.0 - 400.0 K/uL 253.0  261  207.0     Lab Results  Component Value Date   HGBA1C 5.4 07/16/2016   Lab Results  Component Value Date   TSH 1.92 08/07/2021    ================================================== I spent a total of 21 minutes with the patient spent in direct patient consultation.  Additional time spent with chart review  / charting (studies, outside notes, etc): 12 min Total Time: 33 min  Current medicines are reviewed at length with the patient today.  (+/- concerns) N/A  Notice: This dictation was prepared with Dragon dictation along with smart phrase technology. Any transcriptional errors that result from this process are unintentional and may not be corrected upon review.  Studies Ordered:   Orders Placed This Encounter  Procedures   EKG 12-Lead   Meds ordered this encounter  Medications   atorvastatin (LIPITOR) 10 MG tablet    Sig: Take 1 tablet (10 mg total) by mouth at bedtime.    Dispense:  90 tablet    Refill:  3    Patient Instructions / Medication Changes & Studies & Tests Ordered   Patient Instructions  Medication Instructions:   No changes  *If you need a refill on your cardiac medications before your next appointment, please call your pharmacy*   Lab Work: Not needed    Testing/Procedures:  Not needed  Follow-Up: At [facility], you and your health needs are our priority.  As  part of our continuing mission to provide you with exceptional heart care, we have created designated Provider Care Teams.  These Care Teams include your primary Cardiologist (physician) and Advanced Practice Providers (APPs -  Physician Assistants and Nurse Practitioners) who all work together to provide you with the care you need, when you need it.     Your next appointment:   12 month(s)  The format for your next appointment:   In Person  Provider:   Glenetta Hew, MD      Leonie Man, MD, [name], M.D., M.S. Interventional Cardiologist  [place]  Pager # [phone] Phone # [phone] [address] [place], [place] [address]   Thank you for choosing [facility]!!

## 2022-02-24 ENCOUNTER — Encounter: Payer: Self-pay | Admitting: Cardiology

## 2022-02-24 NOTE — Assessment & Plan Note (Addendum)
Coronary Calcium Score is 200.  Should be less than 70, we have time to get better.  Continue to monitor.  Improving, will increase to 20 mg atorvastatin.  (We may need to go forward 40 versus convert to rosuvastatin 20 mg.)

## 2022-02-24 NOTE — Assessment & Plan Note (Addendum)
Much better controlled.  No longer present to the same extent.  Noting short little atrial runs-no significant PVCs or ventricular runs..  Probably do not need to treat, if they become more more symptomatic, then we can probably consider low-dose beta-blocker.

## 2022-02-24 NOTE — Assessment & Plan Note (Signed)
Blood pressure controlled on current meds.  No need to change-continue valsartan-HCTZ and amlodipine.  Not on beta-blocker because of resting heart rate in the 60s.

## 2022-02-24 NOTE — Assessment & Plan Note (Addendum)
LDL is 86, would like it to be closer to 70, but at least it is less than 100.  Continue to monitor.  She seems to be tolerating current dose of 10 mg atorvastatin.  If levels do not go the right direction (down) and next check, would probably consider adding Zetia versus increasing to at least 20 if not 40 atorvastatin versus changing to 20 mg rosuvastatin.

## 2022-02-24 NOTE — Assessment & Plan Note (Signed)
Have been having issues with low heart rates which is the reason why were not using beta-blocker.  Currently stable at 33.

## 2022-03-24 ENCOUNTER — Telehealth (INDEPENDENT_AMBULATORY_CARE_PROVIDER_SITE_OTHER): Payer: Medicare Other | Admitting: Family Medicine

## 2022-03-24 ENCOUNTER — Telehealth: Payer: Medicare Other | Admitting: Family Medicine

## 2022-03-24 VITALS — BP 140/76 | HR 74 | Ht 60.0 in | Wt 141.0 lb

## 2022-03-24 DIAGNOSIS — E2839 Other primary ovarian failure: Secondary | ICD-10-CM | POA: Diagnosis not present

## 2022-03-24 DIAGNOSIS — Z0000 Encounter for general adult medical examination without abnormal findings: Secondary | ICD-10-CM | POA: Diagnosis not present

## 2022-03-24 DIAGNOSIS — I10 Essential (primary) hypertension: Secondary | ICD-10-CM

## 2022-03-24 NOTE — Progress Notes (Signed)
PATIENT CHECK-IN and HEALTH RISK ASSESSMENT QUESTIONNAIRE:  -completed by phone/video for upcoming Medicare Preventive Visit  Pre-Visit Check-in: 1)Vitals (height, wt, BP, etc) - record in vitals section for visit on day of visit 2)Review and Update Medications, Allergies PMH, Surgeries, Social history in Epic 3)Hospitalizations in the last year with date/reason? no  4)Review and Update Care Team (patient's specialists) in Epic 5) Complete PHQ9 in Epic  6) Complete Fall Screening in Epic 7)Review all Health Maintenance Due and order under PCP if not done.  8)Medicare Wellness Questionnaire: Answer theses question about your habits: Do you drink alcohol? No If yes, how many drinks do you have a day?n/a Have you ever smoked?no Quit date if applicable? no  How many packs a day do/did you smoke? no Do you use smokeless tobacco?no Do you use an illicit drugs?no Do you exercises? Yes IF so, what type and how many days/minutes per week?3-4 times a wk, walking for 45 mins Are you sexually active? No Number of partners? Typical breakfast: oatmeal, banana, some type of fruit. Occasionally bacon Typical lunch: Chicken, greens, and sometimes bread Typical dinner: similar to lunch Typical snacks:chips, candy, cookies  Beverages: Yes  Answer theses question about you: Can you perform most household chores?Yes Do you find it hard to follow a conversation in a noisy room?No Do you often ask people to speak up or repeat themselves?No Do you feel that you have a problem with memory?Yes, slightly - does not feel needs evaluation Do you balance your checkbook and or bank acounts?Yes Do you feel safe at home?Yes Last dentist visit?Last November Do you need assistance with any of the following: Please note if so NO  Driving?  Feeding yourself?  Getting from bed to chair?  Getting to the toilet?  Bathing or showering?  Dressing yourself?  Managing money?  Climbing a flight of stairs  Preparing  meals?  Do you have Advanced Directives in place (Living Will, Healthcare Power or Attorney)? No, plans to do, thinks has living will   Last eye Exam and location? In November, 2023 at Dr. Gwynn Burly on [address] you currently use prescribed or non-prescribed narcotic or opioid pain medications?No  Do you have a history or close family history of breast, ovarian, tubal or peritoneal cancer or a family member with BRCA (breast cancer susceptibility 1 and 2) gene mutations? Sister and brother died from Colon cancer.  Nurse/Assistant Credentials/time stamp: Karpuih M./CMA/10:07am   ----------------------------------------------------------------------------------------------------------------------------------------------------------------------------------------------------------------------   MEDICARE ANNUAL PREVENTIVE VISIT WITH PROVIDER: (Welcome to Medicare, initial annual wellness or annual wellness exam)  Virtual Visit via Video  I connected with Tiena   on 03/24/22 bya video enabled telemedicine application and verified that I am speaking with the correct person using two identifiers.  Location patient: home Location provider:work or home office Persons participating in the virtual visit: patient, provider  Concerns and/or follow up today: none. BP was a bit higher than usual today she reports be cause she jumped up with the call and checked it without sitting first. Reports usually is better.    See HM section in Epic for other details of completed HM.    ROS: negative for report of fevers, unintentional weight loss, vision changes, vision loss, hearing loss or change, chest pain, sob, hemoptysis, melena, hematochezia, hematuria, genital discharge or lesions, falls, bleeding or bruising, loc, thoughts of suicide or self harm, memory loss  Patient-completed extensive health risk assessment - reviewed and discussed with the patient: See Health Risk Assessment completed  with patient prior to the visit either above or in recent phone note. This was reviewed in detailed with the patient today and appropriate recommendations, orders and referrals were placed as needed per Summary below and patient instructions.   Review of Medical History: -PMH, PSH, Family History and current specialty and care providers reviewed and updated and listed below   Patient Care Team: [place], Betty G, MD as PCP - General (Family Medicine) Leonie Man, MD as PCP - Cardiology (Cardiology)   Past Medical History:  Diagnosis Date   Anemia    Anxiety    no meds   Coronary artery calcification seen on CT scan 07/08/2021   Coronary Calcium Score = 212, LM 144, LAD 68.  Noncardiac findings: Aortic atherosclerosis, small pericardial effusion.   COVID-19    2022   Dyslipidemia 05/08/2013   Essential hypertension 05/08/2013   Glaucoma    Palpitations 05/08/2013   Hx    SVD (spontaneous vaginal delivery)    x 1    Past Surgical History:  Procedure Laterality Date   COLONOSCOPY     EXCISION MASS NECK     HYSTEROSCOPY N/A 07/22/2015   Procedure: HYSTEROSCOPY;  Surgeon: Alden Hipp, MD;  Location: [facility];  Service: Gynecology;  Laterality: N/A;   LEFT HEART CATH AND CORONARY ANGIOGRAPHY  04/25/2001   Dr. Rex Kras: Minimal irregularities in the LCx.  Intramyocardial segment of the LAD with tenting in the segment that becomes epicardial no fixed lesions; EF > 60%, ? MVP without MR.-FALSE POSITIVE STRESS TEST   NM MYOVIEW LTD  02/13/2009   EF 70%  Low risk scan   NM PERSANTINE MYOVIEW LTD  01/30/2009   EF> 70%. No ischemia or infarction   ROBOTIC ASSISTED TOTAL HYSTERECTOMY WITH BILATERAL SALPINGO OOPHERECTOMY N/A 10/22/2020   Procedure: XI ROBOTIC ASSISTED TOTAL HYSTERECTOMY WITH BILATERAL SALPINGO OOPHORECTOMY;  Surgeon: Everitt Amber, MD;  Location: [facility];  Service: Gynecology;  Laterality: N/A;   TOE SURGERY     TRANSTHORACIC ECHOCARDIOGRAM  01/30/2009   Normal LV size  and function. Mild MR and TR. Otherwise normal. Normal diastolic function.   WISDOM TOOTH EXTRACTION      Social History   Socioeconomic History   Marital status: Married    Spouse name: Not on file   Number of children: Not on file   Years of education: Not on file   Highest education level: 12th grade  Occupational History   Not on file  Tobacco Use   Smoking status: Never   Smokeless tobacco: Never  Vaping Use   Vaping Use: Never used  Substance and Sexual Activity   Alcohol use: No   Drug use: No   Sexual activity: Not Currently    Birth control/protection: Post-menopausal  Other Topics Concern   Not on file  Social History Narrative   Single mother of one, grandmother 86, great-grandmother 2.   Does not smoke, does not drink.   Exercises usually on treadmill mostly 3 days a week.   Social Determinants of Health   Financial Resource Strain: Low Risk  (10/22/2021)   Overall Financial Resource Strain (CARDIA)    Difficulty of Paying Living Expenses: Not hard at all  Food Insecurity: No Food Insecurity (10/22/2021)   Hunger Vital Sign    Worried About Running Out of Food in the Last Year: Never true    Ran Out of Food in the Last Year: Never true  Transportation Needs: No Transportation Needs (10/22/2021)   PRAPARE - Transportation  Lack of Transportation (Medical): No    Lack of Transportation (Non-Medical): No  Physical Activity: Sufficiently Active (10/22/2021)   Exercise Vital Sign    Days of Exercise per Week: 4 days    Minutes of Exercise per Session: 40 min  Stress: No Stress Concern Present (10/22/2021)   [place]    Feeling of Stress : Not at all  Social Connections: Cataract (10/22/2021)   Social Connection and Isolation Panel [NHANES]    Frequency of Communication with Friends and Family: More than three times a week    Frequency of Social Gatherings with Friends and Family:  More than three times a week    Attends Religious Services: More than 4 times per year    Active Member of [employer] or Organizations: Yes    Attends Music therapist: More than 4 times per year    Marital Status: Married  Human resources officer Violence: Not At Risk (03/20/2021)   Humiliation, Afraid, Rape, and Kick questionnaire    Fear of Current or Ex-Partner: No    Emotionally Abused: No    Physically Abused: No    Sexually Abused: No    Family History  Problem Relation Age of Onset   Colon cancer Sister    Colon cancer Brother    Colon cancer Cousin     Current Outpatient Medications on File Prior to Visit  Medication Sig Dispense Refill   amLODipine (NORVASC) 5 MG tablet TAKE 1 TABLET (5 MG TOTAL) BY MOUTH DAILY. 90 tablet 2   atorvastatin (LIPITOR) 10 MG tablet Take 1 tablet (10 mg total) by mouth at bedtime. 90 tablet 3   calcium carbonate (CALTRATE 600) 1500 (600 Ca) MG TABS tablet Take 600 mg of elemental calcium by mouth 2 (two) times daily with a meal.     latanoprost (XALATAN) 0.005 % ophthalmic solution Place 1 drop into both eyes at bedtime.     OVER THE COUNTER MEDICATION Liga-complex-take one tablet two to three times daily     valsartan-hydrochlorothiazide (DIOVAN-HCT) 160-12.5 MG tablet Take 1 tablet by mouth daily. 90 tablet 3   No current facility-administered medications on file prior to visit.    Allergies  Allergen Reactions   Citalopram Swelling and Other (See Comments)    Mouth swells       Physical Exam Vitals:   03/24/22 0955  BP: (!) 140/76  Pulse: 74   Estimated body mass index is 27.54 kg/m as calculated from the following:   Height as of this encounter: 5' (1.524 m).   Weight as of this encounter: 141 lb (64 kg).  EKG (optional): deferred due to virtual visit  GENERAL: alert, oriented, no acute distress detected, full vision exam deferred due to pandemic and/or virtual encounter   HEENT: atraumatic, conjunttiva clear, no  obvious abnormalities on inspection of external nose and ears  NECK: normal movements of the head and neck  LUNGS: on inspection no signs of respiratory distress, breathing rate appears normal, no obvious gross SOB, gasping or wheezing  CV: no obvious cyanosis  MS: moves all visible extremities without noticeable abnormality  PSYCH/NEURO: pleasant and cooperative, no obvious depression or anxiety, speech and thought processing grossly intact, Cognitive function grossly intact        03/24/2022    9:59 AM 01/19/2022    9:07 AM 03/20/2021    1:55 PM 01/01/2021    2:04 PM 12/28/2018   12:54 PM  Depression screen PHQ  2/9  Decreased Interest 0 0 0 0 0  Down, Depressed, Hopeless 0 0 0 0 0  PHQ - 2 Score 0 0 0 0 0       03/20/2021    1:58 PM 03/20/2021    1:59 PM 10/22/2021   11:25 PM 01/19/2022    9:07 AM 03/24/2022   10:09 AM  Fall Risk  Falls in the past year? 1 1 0 0 1  Number of falls in past year - Comments Fall w/o injuryor medical attention      Was there an injury with Fall? 0 0  0 0  Fall Risk Category Calculator 1 1  0 1  Fall Risk Category (Retired) Low Low  Low   (RETIRED) Patient Fall Risk Level Low fall risk   Low fall risk   Patient at Risk for Falls Due to    Other (Comment) Other (Comment)  Fall risk Follow up    Falls evaluation completed Falls evaluation completed  Just tripped over something, no injury.    SUMMARY AND PLAN:  Medicare annual wellness visit, subsequent  Estrogen deficiency - Plan: DG Bone Density  Essential hypertension    Discussed applicable health maintenance/preventive health measures and advised and referred or ordered per patient preferences:  Health Maintenance  Topic Date Due   Medicare Annual Wellness (AWV)  03/20/2022   INFLUENZA VACCINE  Flu shots have always made her sick, no longer gets these.   MAMMOGRAM  08/28/2023   COLONOSCOPY (Pts 45-39yr Insurance coverage will need to be confirmed)  07/31/2025   DTaP/Tdap/Td (2  - Td or Tdap) 07/18/2026   Pneumonia Vaccine 72 Years old  completed   DEXA SCAN  Last done in 2020, osteopenia, she wants to repeat, sent message to staff to order. CMA reports ordered under pcp.    COVID-19 Vaccine  Completed   Hepatitis C Screening  Completed   Zoster Vaccines- Shingrix  Completed   HPV VACCINES  Aged OSafe[employer] Corporationand counseling on the following was provided based on the above review of health and a plan/checklist for the patient, along with additional information discussed, was provided for the patient in the patient instructions :  -Advised on importance of and resources for completing advanced directives, info provided in hand out. -Provided counseling and plan for increased risk of falling if applicable per above screening. Discussed and demonstrated safe balance exercises to do daily.  -Advised / counseled/provided info on maintaining healthy weight and healthy lifestyle - including the importance of a health diet, regular physical activity, social connections and stress management. -Advised and counseled on a whole foods based healthy diet and regular exercise: discussed a heart healthy whole foods based diet at length. A summary of a healthy diet was provided in the Patient Instructions. -Recommended continued regular exercise and discussed options within the community. Discussed exercise guidelines -Advise yearly dental visits at minimum and regular eye exams -Discussed and provided diet for HTN/osteopenia/her other health issues.  -advised to monitor BP for the next few days ago home. Discussed goal. Advised to contact office for inperson evaluation if running above goal.  Follow up: see patient instructions     Patient Instructions  I really enjoyed getting to talk with you today! I am available on Tuesdays and Thursdays for virtual visits if you have any questions or concerns, or if I can be of any further assistance.   CHECKLIST FROM ANNUAL WELLNESS  VISIT:  Check Blood pressure daily for  3 days. Goal is 120/70. If running over 140/80, please call to schedule inperson check. Otherwise, please check out the diet and exercise guidelines below which can help to lower blood pressure.   -Follow up (please call to schedule if not scheduled after visit):  -Inperson visit with your Primary Doctor office: every 3-4 months, sooner if Blood Pressure remains elevated -yearly for annual wellness visit with primary care office  Here is a list of your preventive care/health maintenance measures and the plan for each if any are due:  Health Maintenance  Topic Date Due   Medicare Annual Wellness (AWV)  03/20/2022   INFLUENZA VACCINE  05/31/2022 (Originally 09/30/2021)   MAMMOGRAM  08/28/2023   COLONOSCOPY (Pts 45-11yr Insurance coverage will need to be confirmed)  07/31/2025   DTaP/Tdap/Td (2 - Td or Tdap) 07/18/2026   Pneumonia Vaccine 72 Years old  Completed   DEXA SCAN  Completed   COVID-19 Vaccine  Completed   Hepatitis C Screening  Completed   Zoster Vaccines- Shingrix  Completed   HPV VACCINES  Aged Out    -See a dentist at least yearly  -Get your eyes checked and then per your eye specialist's recommendations  -Other issues addressed today: -advanced directives  -I have included below further information regarding a healthy whole foods based diet, physical activity guidelines for adults, stress management and opportunities for social connections. I hope you find this information useful.     NUTRITION: -eat real food: lots of colorful vegetables (half the plate) and fruits -5-7 servings of vegetables and fruits per day (fresh or steamed is best), exp. 2 servings of vegetables with lunch  and dinner and 2 servings of fruit per day. Berries and greens such as kale and collards are great choices.  -consume on a regular basis: whole grains (make sure first ingredient on label contains the word "whole"), fresh fruits, fish, nuts, seeds, healthy oils (such as olive oil, avocado oil, grape seed oil) -may eat small amounts of dairy and lean meat on occasion, but avoid processed meats such as ham, bacon, lunch meat, etc. -drink water -try to avoid fast food and pre-packaged foods, processed meat -most experts advise limiting sodium to < '2300mg'$  per day, should limit further is any chronic conditions such as high blood pressure, heart disease, diabetes, etc. The American Heart Association advised that < '1500mg'$  is is ideal -try to avoid foods that contain any ingredients with names you do not recognize  -try to avoid sugar/sweets (except for the natural sugar that occurs in fresh fruit) -try to avoid sweet drinks -try to avoid white rice, white bread, pasta (unless whole grain), white or yellow potatoes  EXERCISE GUIDELINES FOR ADULTS: -if you wish to increase your physical activity, do so gradually and with the approval of your doctor -STOP and seek medical care immediately if you have any chest pain, chest discomfort or trouble breathing when starting or increasing exercise  -move and stretch your body, legs, feet and arms when sitting for long periods -Physical activity guidelines for optimal health in adults: -least 150 minutes per week of aerobic exercise (can talk, but not sing) once approved by your doctor, 20-30 minutes of sustained activity or two 10 minute episodes of sustained activity every day.  -resistance training at least 2 days per week if approved by your doctor -balance exercises 3+ days per week:   Stand somewhere where you have something sturdy to hold onto if you lose balance.    1) lift up  on toes, start with 5x per day and work up to 20x   2) stand and lift on leg  straight out to the side so that foot is a few inches of the floor, start with 5x each side and work up to 20x each side   3) stand on one foot, start with 5 seconds each side and work up to 20 seconds on each side  If you need ideas or help with getting more active:  -Silver sneakers https://tools.silversneakers.com  -Walk with a Doc: http://stephens-thompson.biz/  -try to include resistance (weight lifting/strength building) and balance exercises twice per week: or the following link for ideas: ChessContest.fr  UpdateClothing.com.cy  STRESS MANAGEMENT: -can try meditating, or just sitting quietly with deep breathing while intentionally relaxing all parts of your body for 5 minutes daily -if you need further help with stress, anxiety or depression please follow up with your primary doctor or contact the wonderful folks at [place]: [place]: -options in [place] if you wish to engage in more social and exercise related activities:  -Silver sneakers https://tools.silversneakers.com  -Walk with a Doc: http://stephens-thompson.biz/  -Check out the [place] 50+ section on the [place] [employer] (hiking clubs, book clubs, cards and games, chess, exercise classes, aquatic classes and much more) - see the website for details: https://www.[place].gov/departments/parks-recreation/active-adults50  -YouTube has lots of exercise videos for different ages and abilities as well  -[place] (a variety of indoor and outdoor inperson activities for adults). 914-335-2000. [address]  -Virtual Online Classes (a variety of topics): see seniorplanet.org or call (818)182-6476  -consider volunteering at a school, hospice center, church, senior center or elsewhere    ADVANCED HEALTHCARE DIRECTIVES:  Everyone should  have advanced health care directives in place. This is so that you get the care you want, should you ever be in a situation where you are unable to make your own medical decisions.   From the [place] Advanced Directive Website: "[place] are legal documents in which you give written instructions about your health care if, in the future, you cannot speak for yourself.   A health care power of attorney allows you to name a person you trust to make your health care decisions if you cannot make them yourself. A declaration of a desire for a natural death (or living will) is document, which states that you desire not to have your life prolonged by extraordinary measures if you have a terminal or incurable illness or if you are in a vegetative state. An advance instruction for mental health treatment makes a declaration of instructions, information and preferences regarding your mental health treatment. It also states that you are aware that the advance instruction authorizes a mental health treatment provider to act according to your wishes. It may also outline your consent or refusal of mental health treatment. A declaration of an anatomical gift allows anyone over the age of 59 to make a gift by will, organ donor card or other document."   Please see the following website or an elder law attorney for forms, FAQs and for completion of advanced directives: [place] Secretary of [place] ([email or web address].no)  Or copy and paste the following to your web browser: PokerReunion.com.cy         Lucretia Kern, DO

## 2022-03-24 NOTE — Patient Instructions (Addendum)
I really enjoyed getting to talk with you today! I am available on Tuesdays and Thursdays for virtual visits if you have any questions or concerns, or if I can be of any further assistance.   CHECKLIST FROM ANNUAL WELLNESS VISIT:  Check Blood pressure daily for 3 days. Goal is 120/70. If running over 140/80, please call to schedule inperson check. Otherwise, please check out the diet and exercise guidelines below which can help to lower blood pressure.   -Follow up (please call to schedule if not scheduled after visit):  -Inperson visit with your Primary Doctor office: every 3-4 months, sooner if Blood Pressure remains elevated -yearly for annual wellness visit with primary care office  Here is a list of your preventive care/health maintenance measures and the plan for each if any are due:  Health Maintenance  Topic Date Due   Medicare Annual Wellness (AWV)  03/20/2022   INFLUENZA VACCINE  05/31/2022 (Originally 09/30/2021)   MAMMOGRAM  08/28/2023   COLONOSCOPY (Pts 45-81yr Insurance coverage will need to be confirmed)  07/31/2025   DTaP/Tdap/Td (2 - Td or Tdap) 07/18/2026   Pneumonia Vaccine 70 Years old  Completed   DEXA SCAN  Completed   COVID-19 Vaccine  Completed   Hepatitis C Screening  Completed   Zoster Vaccines- Shingrix  Completed   HPV VACCINES  Aged Out    -See a dentist at least yearly  -Get your eyes checked and then per your eye specialist's recommendations  -Other issues addressed today: -advanced directives  -I have included below further information regarding a healthy whole foods based diet, physical activity guidelines for adults, stress management and opportunities for social connections. I hope you find this information useful.      NUTRITION: -eat real food: lots of colorful vegetables (half the plate) and fruits -5-7 servings of vegetables and fruits per day (fresh or steamed is best), exp. 2 servings of vegetables with lunch and dinner and 2 servings of fruit per day. Berries and greens such as kale and collards are great choices.  -consume on a regular basis: whole grains (make sure first ingredient on label contains the word "whole"), fresh fruits, fish, nuts, seeds, healthy oils (such as olive oil, avocado oil, grape seed oil) -may eat small amounts of dairy and lean meat on occasion, but avoid processed meats such as ham, bacon, lunch meat, etc. -drink water -try to avoid fast food and pre-packaged foods, processed meat -most experts advise limiting sodium to < '2300mg'$  per day, should limit further is any chronic conditions such as high blood pressure, heart disease, diabetes, etc. The American Heart Association advised that < '1500mg'$  is is ideal -try to avoid foods that contain any ingredients with names you do not recognize  -try to avoid sugar/sweets (except for the natural sugar that occurs in fresh fruit) -try to avoid sweet drinks -try to avoid white rice, white bread, pasta (unless whole grain), white or yellow potatoes  EXERCISE GUIDELINES FOR ADULTS: -if you wish to increase your physical activity, do so gradually and with the approval of your doctor -STOP and seek medical care immediately if you have any chest pain, chest discomfort or trouble breathing when starting or increasing exercise  -move and stretch your body, legs, feet and arms when sitting for long periods -Physical activity guidelines for optimal health in adults: -least 150 minutes per week of  aerobic exercise (can talk, but not sing) once approved by your doctor, 20-30 minutes of sustained activity or two 10  minute episodes of sustained activity every day.  -resistance training at least 2 days per week if approved by your doctor -balance exercises 3+ days per week:   Stand somewhere where you have something sturdy to hold onto if you lose balance.    1) lift up on toes, start with 5x per day and work up to 20x   2) stand and lift on leg straight out to the side so that foot is a few inches of the floor, start with 5x each side and work up to 20x each side   3) stand on one foot, start with 5 seconds each side and work up to 20 seconds on each side  If you need ideas or help with getting more active:  -Silver sneakers https://tools.silversneakers.com  -Walk with a Doc: http://stephens-thompson.biz/  -try to include resistance (weight lifting/strength building) and balance exercises twice per week: or the following link for ideas: ChessContest.fr  UpdateClothing.com.cy  STRESS MANAGEMENT: -can try meditating, or just sitting quietly with deep breathing while intentionally relaxing all parts of your body for 5 minutes daily -if you need further help with stress, anxiety or depression please follow up with your primary doctor or contact the wonderful folks at [place]: [place]: -options in [place] if you wish to engage in more social and exercise related activities:  -Silver sneakers https://tools.silversneakers.com  -Walk with a Doc: http://stephens-thompson.biz/  -Check out the [place] 50+ section on the [place] [employer] (hiking clubs, book clubs, cards and games, chess, exercise classes, aquatic classes and much more) - see the website for  details: https://www.[place].gov/departments/parks-recreation/active-adults50  -YouTube has lots of exercise videos for different ages and abilities as well  -[place] (a variety of indoor and outdoor inperson activities for adults). [phone]. [address].  -Virtual Online Classes (a variety of topics): see seniorplanet.org or call ([phone]  -consider volunteering at a school, hospice center, church, senior center or elsewhere    ADVANCED HEALTHCARE DIRECTIVES:  Everyone should have advanced health care directives in place. This is so that you get the care you want, should you ever be in a situation where you are unable to make your own medical decisions.   From the [place] Advanced Directive Website: "[place] are legal documents in which you give written instructions about your health care if, in the future, you cannot speak for yourself.   A health care power of attorney allows you to name a person you trust to make your health care decisions if you cannot make them yourself. A declaration of a desire for a natural death (or living will) is document, which states that you desire not to have your life prolonged by extraordinary measures if you have a terminal or incurable illness or if you are in a vegetative state. An advance instruction for mental health treatment makes a declaration of instructions, information and preferences regarding your mental health treatment. It also states that you are aware that the advance instruction authorizes a mental health treatment provider to act according to your wishes. It may also outline your consent or refusal of mental health treatment. A declaration of an anatomical gift allows anyone over the age of 94 to make a gift by will, organ donor card or other document."   Please see the following website or an elder law attorney for forms, FAQs and for completion of advanced directives: [place] [employer] [place]  Health Care Directives ([email or web address].no)  Or copy and paste the following to your web browser: [email or web address]

## 2022-04-09 ENCOUNTER — Ambulatory Visit (HOSPITAL_BASED_OUTPATIENT_CLINIC_OR_DEPARTMENT_OTHER)
Admission: RE | Admit: 2022-04-09 | Discharge: 2022-04-09 | Disposition: A | Discharge status: Home / Self Care | Payer: Medicare Other | Source: Ambulatory Visit | Attending: Family Medicine | Admitting: Family Medicine

## 2022-04-09 DIAGNOSIS — M8589 Other specified disorders of bone density and structure, multiple sites: Secondary | ICD-10-CM | POA: Diagnosis not present

## 2022-04-09 DIAGNOSIS — E2839 Other primary ovarian failure: Secondary | ICD-10-CM | POA: Diagnosis present

## 2022-04-09 DIAGNOSIS — Z780 Asymptomatic menopausal state: Secondary | ICD-10-CM | POA: Diagnosis not present

## 2022-04-20 DIAGNOSIS — H401132 Primary open-angle glaucoma, bilateral, moderate stage: Secondary | ICD-10-CM | POA: Diagnosis not present

## 2022-05-26 ENCOUNTER — Other Ambulatory Visit: Payer: Self-pay

## 2022-05-26 ENCOUNTER — Emergency Department (HOSPITAL_BASED_OUTPATIENT_CLINIC_OR_DEPARTMENT_OTHER): Payer: Medicare Other

## 2022-05-26 ENCOUNTER — Encounter (HOSPITAL_BASED_OUTPATIENT_CLINIC_OR_DEPARTMENT_OTHER): Payer: Self-pay

## 2022-05-26 ENCOUNTER — Emergency Department (HOSPITAL_BASED_OUTPATIENT_CLINIC_OR_DEPARTMENT_OTHER)
Admission: EM | Admit: 2022-05-26 | Discharge: 2022-05-26 | Disposition: A | Discharge status: Home / Self Care | Payer: Medicare Other | Attending: Emergency Medicine | Admitting: Emergency Medicine

## 2022-05-26 DIAGNOSIS — R1011 Right upper quadrant pain: Secondary | ICD-10-CM | POA: Diagnosis not present

## 2022-05-26 DIAGNOSIS — I10 Essential (primary) hypertension: Secondary | ICD-10-CM | POA: Insufficient documentation

## 2022-05-26 DIAGNOSIS — I700 Atherosclerosis of aorta: Secondary | ICD-10-CM | POA: Diagnosis not present

## 2022-05-26 DIAGNOSIS — Z79899 Other long term (current) drug therapy: Secondary | ICD-10-CM | POA: Diagnosis not present

## 2022-05-26 DIAGNOSIS — R112 Nausea with vomiting, unspecified: Secondary | ICD-10-CM | POA: Insufficient documentation

## 2022-05-26 DIAGNOSIS — D649 Anemia, unspecified: Secondary | ICD-10-CM | POA: Diagnosis not present

## 2022-05-26 DIAGNOSIS — M546 Pain in thoracic spine: Secondary | ICD-10-CM | POA: Insufficient documentation

## 2022-05-26 DIAGNOSIS — R079 Chest pain, unspecified: Secondary | ICD-10-CM | POA: Diagnosis not present

## 2022-05-26 LAB — CBC WITH DIFFERENTIAL/PLATELET
Abs Immature Granulocytes: 0.02 10*3/uL (ref 0.00–0.07)
Basophils Absolute: 0 10*3/uL (ref 0.0–0.1)
Basophils Relative: 0 %
Eosinophils Absolute: 0 10*3/uL (ref 0.0–0.5)
Eosinophils Relative: 0 %
HCT: 37.8 % (ref 36.0–46.0)
Hemoglobin: 11.9 g/dL — ABNORMAL LOW (ref 12.0–15.0)
Immature Granulocytes: 0 %
Lymphocytes Relative: 12 %
Lymphs Abs: 1 10*3/uL (ref 0.7–4.0)
MCH: 25.6 pg — ABNORMAL LOW (ref 26.0–34.0)
MCHC: 31.5 g/dL (ref 30.0–36.0)
MCV: 81.5 fL (ref 80.0–100.0)
Monocytes Absolute: 0.2 10*3/uL (ref 0.1–1.0)
Monocytes Relative: 2 %
Neutro Abs: 7.3 10*3/uL (ref 1.7–7.7)
Neutrophils Relative %: 86 %
Platelets: 248 10*3/uL (ref 150–400)
RBC: 4.64 MIL/uL (ref 3.87–5.11)
RDW: 15.3 % (ref 11.5–15.5)
WBC: 8.6 10*3/uL (ref 4.0–10.5)
nRBC: 0 % (ref 0.0–0.2)

## 2022-05-26 LAB — COMPREHENSIVE METABOLIC PANEL
ALT: 17 U/L (ref 0–44)
AST: 27 U/L (ref 15–41)
Albumin: 4.3 g/dL (ref 3.5–5.0)
Alkaline Phosphatase: 53 U/L (ref 38–126)
Anion gap: 10 (ref 5–15)
BUN: 12 mg/dL (ref 8–23)
CO2: 25 mmol/L (ref 22–32)
Calcium: 9.5 mg/dL (ref 8.9–10.3)
Chloride: 102 mmol/L (ref 98–111)
Creatinine, Ser: 0.61 mg/dL (ref 0.44–1.00)
GFR, Estimated: >60 mL/min (ref >60)
Glucose, Bld: 137 mg/dL — ABNORMAL HIGH (ref 70–99)
Potassium: 3.6 mmol/L (ref 3.5–5.1)
Sodium: 137 mmol/L (ref 135–145)
Total Bilirubin: 0.8 mg/dL (ref 0.3–1.2)
Total Protein: 8.9 g/dL — ABNORMAL HIGH (ref 6.5–8.1)

## 2022-05-26 LAB — TROPONIN I (HIGH SENSITIVITY)
Troponin I (High Sensitivity): <2 ng/L (ref <18)
Troponin I (High Sensitivity): 3 ng/L (ref <18)

## 2022-05-26 LAB — D-DIMER, QUANTITATIVE: D-Dimer, Quant: 0.68 ug/mL-FEU — ABNORMAL HIGH (ref 0.00–0.50)

## 2022-05-26 LAB — LIPASE, BLOOD: Lipase: 26 U/L (ref 11–51)

## 2022-05-26 MED ORDER — MORPHINE SULFATE (PF) 4 MG/ML IV SOLN
4.0000 mg | Freq: Once | INTRAVENOUS | Status: AC
  Administered 2022-05-26: 4 mg via INTRAVENOUS
  Filled 2022-05-26: qty 1

## 2022-05-26 MED ORDER — LIDOCAINE VISCOUS HCL 2 % MT SOLN
15.0000 mL | Freq: Once | OROMUCOSAL | Status: AC
  Administered 2022-05-26: 15 mL via ORAL
  Filled 2022-05-26: qty 15

## 2022-05-26 MED ORDER — FAMOTIDINE 20 MG PO TABS: 20.0000 mg | ORAL_TABLET | Freq: Every day | ORAL | 0 refills | Status: DC

## 2022-05-26 MED ORDER — ALUM & MAG HYDROXIDE-SIMETH 200-200-20 MG/5ML PO SUSP
30.0000 mL | Freq: Once | ORAL | Status: AC
  Administered 2022-05-26: 30 mL via ORAL
  Filled 2022-05-26: qty 30

## 2022-05-26 MED ORDER — ONDANSETRON 4 MG PO TBDP: 4.0000 mg | ORAL_TABLET | Freq: Three times a day (TID) | ORAL | 0 refills | Status: DC | PRN

## 2022-05-26 MED ORDER — ONDANSETRON HCL 4 MG/2ML IJ SOLN
4.0000 mg | Freq: Once | INTRAMUSCULAR | Status: AC
  Administered 2022-05-26: 4 mg via INTRAVENOUS
  Filled 2022-05-26: qty 2

## 2022-05-26 MED ORDER — PANTOPRAZOLE SODIUM 40 MG PO TBEC: 40.0000 mg | DELAYED_RELEASE_TABLET | Freq: Every day | ORAL | 0 refills | Status: DC

## 2022-05-26 MED ORDER — IOHEXOL 350 MG/ML SOLN
100.0000 mL | Freq: Once | INTRAVENOUS | Status: AC | PRN
  Administered 2022-05-26: 100 mL via INTRAVENOUS

## 2022-05-26 NOTE — ED Provider Notes (Incomplete)
[facility] Provider Note   CSN: [id number] Arrival date & time: 05/26/22  1634     History  Chief Complaint  Patient presents with   Back Pain    Nichole Walters is a 72 y.o. female.   Back Pain    72 year old female with medical history significant for HTN, HLD, PAD, thoracic aortic atherosclerosis who presents to the emergency department with sudden onset back pain that woke her up this morning.  The patient states that she has had pain in her mid thoracic back that radiates to her chest.  She endorses associated nausea and vomiting.  She denies any blood or bile in her emesis.  She denies any substernal chest pressure.  She denies any ripping or tearing component.  She has a history of chronic thoracic and lumbar back pain however this pain feels different.  No fevers, chills, cough.  She does endorse mild shortness of breath.  Home Medications Prior to Admission medications   Medication Sig Start Date End Date Taking? Authorizing Provider  famotidine (PEPCID) 20 MG tablet Take 1 tablet (20 mg total) by mouth daily. 05/26/22 06/25/22 Yes Regan Lemming, MD  ondansetron (ZOFRAN-ODT) 4 MG disintegrating tablet Take 1 tablet (4 mg total) by mouth every 8 (eight) hours as needed. 05/26/22  Yes Regan Lemming, MD  pantoprazole (PROTONIX) 40 MG tablet Take 1 tablet (40 mg total) by mouth daily. 05/26/22 06/25/22 Yes Regan Lemming, MD  amLODipine (NORVASC) 5 MG tablet TAKE 1 TABLET (5 MG TOTAL) BY MOUTH DAILY. 01/26/22   [place], Betty G, MD  atorvastatin (LIPITOR) 10 MG tablet Take 1 tablet (10 mg total) by mouth at bedtime. 02/04/22   Leonie Man, MD  calcium carbonate (CALTRATE 600) 1500 (600 Ca) MG TABS tablet Take 600 mg of elemental calcium by mouth 2 (two) times daily with a meal.    [provider]  latanoprost (XALATAN) 0.005 % ophthalmic solution Place 1 drop into both eyes at bedtime. 03/29/13   [provider]  OVER THE COUNTER MEDICATION Liga-complex-take one tablet two to three times daily    [provider]  valsartan-hydrochlorothiazide (DIOVAN-HCT) 160-12.5 MG tablet Take 1 tablet by mouth daily. 06/11/21   Leonie Man, MD      Allergies    Citalopram    Review of Systems   Review of Systems  Gastrointestinal:  Positive for nausea and vomiting.  Musculoskeletal:  Positive for back pain.  All other systems reviewed and are negative.   Physical Exam Updated Vital Signs BP (!) 113/58   Pulse 68   Temp 98.4 F (36.9 C) (Oral)   Resp 13   Ht 5' (1.524 m)   Wt 61.7 kg   SpO2 96%   BMI 26.56 kg/m  Physical Exam Vitals and nursing note reviewed.  Constitutional:      General: She is not in acute distress.    Appearance: She is well-developed.  HENT:     Head: Normocephalic and atraumatic.  Eyes:     Conjunctiva/sclera: Conjunctivae normal.  Cardiovascular:     Rate and Rhythm: Normal rate and regular rhythm.     Pulses: Normal pulses.  Pulmonary:     Effort: Pulmonary effort is normal. No respiratory distress.     Breath sounds: Normal breath sounds.  Abdominal:     Palpations: Abdomen is soft.     Tenderness: There is no abdominal tenderness.  Musculoskeletal:  General: No swelling.     Cervical back: Neck supple.     Right lower leg: No edema.     Left lower leg: No edema.  Skin:    General: Skin is warm and dry.     Capillary Refill: Capillary refill takes less than 2 seconds.  Neurological:     General: No focal deficit present.     Mental Status: She is alert and oriented to person, place, and time. Mental status is at baseline.  Psychiatric:        Mood and Affect: Mood normal.     ED Results / Procedures / Treatments   Labs (all labs ordered are listed, but only abnormal results are displayed) Labs Reviewed  COMPREHENSIVE METABOLIC PANEL - Abnormal; Notable for the following components:      Result Value   Glucose, Bld  137 (*)    Total Protein 8.9 (*)    All other components within normal limits  CBC WITH DIFFERENTIAL/PLATELET - Abnormal; Notable for the following components:   Hemoglobin 11.9 (*)    MCH 25.6 (*)    All other components within normal limits  D-DIMER, QUANTITATIVE - Abnormal; Notable for the following components:   D-Dimer, Quant 0.68 (*)    All other components within normal limits  LIPASE, BLOOD  TROPONIN I (HIGH SENSITIVITY)  TROPONIN I (HIGH SENSITIVITY)    EKG EKG Interpretation  Date/Time:  Tuesday May 26 2022 18:26:00 EDT Ventricular Rate:  75 PR Interval:  167 QRS Duration: 92 QT Interval:  406 QTC Calculation: 454 R Axis:   74 Text Interpretation: Sinus rhythm Confirmed by Regan Lemming ([id number]) on 05/26/2022 6:57:39 PM  Radiology CT Angio Chest/Abd/Pel for Dissection W and/or Wo Contrast  Result Date: 05/26/2022 CLINICAL DATA:  Acute onset chest pain, initial encounter EXAM: CT ANGIOGRAPHY CHEST, ABDOMEN AND PELVIS TECHNIQUE: Non-contrast CT of the chest was initially obtained. Multidetector CT imaging through the chest, abdomen and pelvis was performed using the standard protocol during bolus administration of intravenous contrast. Multiplanar reconstructed images and MIPs were obtained and reviewed to evaluate the vascular anatomy. RADIATION DOSE REDUCTION: This exam was performed according to the departmental dose-optimization program which includes automated exposure control, adjustment of the mA and/or kV according to patient size and/or use of iterative reconstruction technique. CONTRAST:  154mL OMNIPAQUE IOHEXOL 350 MG/ML SOLN COMPARISON:  07/07/2021 FINDINGS: CTA CHEST FINDINGS Cardiovascular: Initial precontrast images show no hyperdense crescent to suggest acute aortic injury. Thoracic aorta and its branches are within normal limits without evidence of aneurysmal dilatation or dissection. Heart is at the upper limits of normal in size. Mild coronary calcifications  are seen. The pulmonary artery shows no pulmonary emboli although not timed for embolus evaluation. Mediastinum/Nodes: Thoracic inlet is within normal limits. No hilar or mediastinal adenopathy is noted. Stable small subcarinal node is seen unchanged from the prior exam. This is likely reactive in nature. The esophagus as visualized is within normal limits. Lungs/Pleura: Lungs are well aerated bilaterally. No focal infiltrate or effusion is seen. No parenchymal nodules are noted. Musculoskeletal: Degenerative changes of the thoracic spine are seen. No rib abnormality is noted. Review of the MIP images confirms the above findings. CTA ABDOMEN AND PELVIS FINDINGS VASCULAR Aorta: Atherosclerotic calcifications are noted without aneurysmal dilatation or dissection. Celiac: Patent without evidence of aneurysm, dissection, vasculitis or significant stenosis. SMA: Patent without evidence of aneurysm, dissection, vasculitis or significant stenosis. Renals: Both renal arteries are patent without evidence of aneurysm, dissection, vasculitis, fibromuscular dysplasia  or significant stenosis. IMA: Patent without evidence of aneurysm, dissection, vasculitis or significant stenosis. Inflow: Iliacs demonstrate atherosclerotic calcifications without aneurysmal dilatation or focal stenosis. Veins: No specific venous abnormality is noted. Review of the MIP images confirms the above findings. NON-VASCULAR Hepatobiliary: Fatty infiltration of the liver is noted. The gallbladder is within normal limits. Pancreas: Unremarkable. No pancreatic ductal dilatation or surrounding inflammatory changes. Spleen: Normal in size without focal abnormality. Adrenals/Urinary Tract: Adrenal glands are within normal limits bilaterally. Kidneys show no renal calculi or obstructive changes. Normal enhancement pattern is seen. The bladder is well distended. Stomach/Bowel: No obstructive or inflammatory changes of the colon are seen. The appendix is  unremarkable. Small bowel and stomach are within normal limits. Lymphatic: No sizable adenopathy is noted. Reproductive: Status post hysterectomy. No adnexal masses. Other: No abdominal wall hernia or abnormality. No abdominopelvic ascites. Musculoskeletal: No acute or significant osseous findings. Review of the MIP images confirms the above findings. IMPRESSION: No evidence of aortic dissection or aneurysmal dilatation. No evidence of pulmonary emboli. Fatty liver. Electronically Signed   By: Inez Catalina M.D.   On: 05/26/2022 19:49    Procedures Procedures    Medications Ordered in ED Medications  ondansetron (ZOFRAN) injection 4 mg (4 mg Intravenous Given 05/26/22 1819)  morphine (PF) 4 MG/ML injection 4 mg (4 mg Intravenous Given 05/26/22 1820)  iohexol (OMNIPAQUE) 350 MG/ML injection 100 mL (100 mLs Intravenous Contrast Given 05/26/22 1920)  alum & mag hydroxide-simeth (MAALOX/MYLANTA) 200-200-20 MG/5ML suspension 30 mL (30 mLs Oral Given 05/26/22 2051)    And  lidocaine (XYLOCAINE) 2 % viscous mouth solution 15 mL (15 mLs Oral Given 05/26/22 2052)    ED Course/ Medical Decision Making/ A&P                             Medical Decision Making Amount and/or Complexity of Data Reviewed Labs: ordered. Radiology: ordered.  Risk OTC drugs. Prescription drug management.   72 year old female with medical history significant for HTN, HLD, PAD, thoracic aortic atherosclerosis who presents to the emergency department with sudden onset back pain that woke her up this morning.  The patient states that she has had pain in her mid thoracic back that radiates to her chest.  She endorses associated nausea and vomiting.  She denies any blood or bile in her emesis.  She denies any substernal chest pressure.  She denies any ripping or tearing component.  She has a history of chronic thoracic and lumbar back pain however this pain feels different.  No fevers, chills, cough.  She does endorse mild shortness  of breath.  On arrival, the patient was afebrile, not tachycardic or tachypneic, normotensive, saturating well on room air.  Sinus rhythm noted on cardiac telemetry.  Physical exam generally unremarkable.  Patient had no musculoskeletal back pain or tenderness on exam.  No midline tenderness.  No fevers or chills.  No red flag symptoms of numbness, weakness, saddle anesthesia, loss of bowel or bladder function.  Abdomen was soft, nontender, nondistended, no rebound or guarding.  Lungs were clear to auscultation bilaterally.    Differential diagnosis is broad given the patient's nausea and vomiting, back pain with some radiation to her right upper quadrant.  Differential includes cholelithiasis/cholecystitis, peptic ulcer disease, aortic dissection, ACS, PE, bowel obstruction, less likely epidural abscess, osteomyelitis, discitis, acute fracture, viral infection.   EKG revealed sinus rhythm, ventricular rate 75, no acute ischemic changes, no abnormal intervals.  Laboratory evaluation revealed an age-adjusted D-dimer that was normal at 0.68, troponins x 2 negative, lipase normal, CBC without a leukocytosis, mild anemia to 11.9 which is close to the patient's baseline, CMP generally unremarkable with no electrolyte abnormality, normal renal and liver function.  CTA dissection study was obtained: IMPRESSION:  No evidence of aortic dissection or aneurysmal dilatation.    No evidence of pulmonary emboli.    Fatty liver.    Patient was administered IV Zofran, morphine, subsequently tolerated Maalox and viscous lidocaine.  On room reassessment, the patient had improvement in her symptoms, no longer experiencing nausea and vomiting, tolerating oral intake.  Symptoms could be due to peptic ulcer disease.  No clear evidence for ACS, aortic dissection, Boerhaave syndrome, PE, pneumothorax, no acute intra-abdominal abnormality identified.  LFTs and biliary function are normal.  Patient has no evidence for  gallstones or biliary dysfunction, no evidence for cholecystitis, no evidence for pancreatitis.  At this time, workup has been reassuring point away from a life-threatening causes that require further evaluation and/or inpatient monitoring.  Recommend the patient trial a course of Pepcid and Protonix, Zofran provided for nausea, advised that she follow-up outpatient with GI.  Final Clinical Impression(s) / ED Diagnoses Final diagnoses:  RUQ pain  Acute right-sided thoracic back pain  Nausea and vomiting, unspecified vomiting type    Rx / DC Orders ED Discharge Orders          Ordered    famotidine (PEPCID) 20 MG tablet  Daily        05/26/22 2044    pantoprazole (PROTONIX) 40 MG tablet  Daily        05/26/22 2044    ondansetron (ZOFRAN-ODT) 4 MG disintegrating tablet  Every 8 hours PRN        05/26/22 2044    Ambulatory referral to Gastroenterology        05/26/22 2059              Regan Lemming, MD 05/27/22 1505    Regan Lemming, MD 05/27/22 1505

## 2022-05-26 NOTE — Discharge Instructions (Addendum)
Your back pain and RUQ pain could be consistent with a peptic ulcer.  Your laboratory workup and CT ridging was reassuring today.  Will treat with Pepcid and Protonix, Zofran for nausea, and have you follow-up with gastroentoerology

## 2022-05-26 NOTE — ED Triage Notes (Addendum)
Patient presents to ED via POV from home. Here with right sided thoracic back pain that began this morning when patient woke up. Also reports nausea and vomiting. Denies abdominal pain or dysuria. Denies recent injury or fall.

## 2022-06-03 ENCOUNTER — Encounter: Payer: Self-pay | Admitting: Family Medicine

## 2022-06-03 ENCOUNTER — Ambulatory Visit (INDEPENDENT_AMBULATORY_CARE_PROVIDER_SITE_OTHER): Payer: Medicare Other | Admitting: Family Medicine

## 2022-06-03 VITALS — BP 124/70 | HR 79 | Temp 98.5°F | Resp 16 | Ht 60.0 in | Wt 134.0 lb

## 2022-06-03 DIAGNOSIS — R779 Abnormality of plasma protein, unspecified: Secondary | ICD-10-CM | POA: Diagnosis not present

## 2022-06-03 DIAGNOSIS — R1013 Epigastric pain: Secondary | ICD-10-CM | POA: Diagnosis not present

## 2022-06-03 DIAGNOSIS — K760 Fatty (change of) liver, not elsewhere classified: Secondary | ICD-10-CM | POA: Diagnosis not present

## 2022-06-03 DIAGNOSIS — I700 Atherosclerosis of aorta: Secondary | ICD-10-CM | POA: Diagnosis not present

## 2022-06-03 NOTE — Progress Notes (Unsigned)
ACUTE VISIT Chief Complaint  Patient presents with   Hospitalization Follow-up   HPI: [name] is a 72 y.o. female, who is here today complaining of *** HPI t regarding discomfort in the upper abdomen, which has persisted since her visit to the emergency room on March 26th for back pain and vomiting. She reports no longer experiencing significant nausea or vomiting since leaving the ER. However, the patient describes the current discomfort as not being painful but causing her to feel \"funny\" and dizzy.  The patient has a history of a colonoscopy in 2017 and is due for another one due to a family history of colon cancer. She was prescribed Protonix (Pantoprazole) 40 mg daily for six weeks by the emergency department to help with any inflammation or ulcers. The patient denies any black stool, changes in bowel habits, fever, or chills, indicating that her symptoms have mostly resolved, except for the persistent discomfort in the upper abdomen.  The patient has a known diagnosis of fatty liver and has been managing it with a low-fat diet and weight loss. She reports having lost five pounds since her last visit. Her most recent laboratory results showed slight anemia, stable kidney function, and normal liver function. There were abnormalities in protein and sodium levels, but the patient was not fasting and was ill at the time of testing. The patient's blood sugar has been normal during fasting tests in the clinic. Review of Systems See other pertinent positives and negatives in HPI.  Current Outpatient Medications on File Prior to Visit  Medication Sig Dispense Refill   amLODipine (NORVASC) 5 MG tablet TAKE 1 TABLET (5 MG TOTAL) BY MOUTH DAILY. 90 tablet 2   atorvastatin (LIPITOR) 10 MG tablet Take 1 tablet (10 mg total) by mouth at bedtime. 90 tablet 3   calcium carbonate (CALTRATE 600) 1500 (600 Ca) MG TABS tablet Take 600 mg of elemental calcium by mouth 2 (two) times  daily with a meal.     famotidine (PEPCID) 20 MG tablet Take 1 tablet (20 mg total) by mouth daily. 30 tablet 0   latanoprost (XALATAN) 0.005 % ophthalmic solution Place 1 drop into both eyes at bedtime.     ondansetron (ZOFRAN-ODT) 4 MG disintegrating tablet Take 1 tablet (4 mg total) by mouth every 8 (eight) hours as needed. 20 tablet 0   OVER THE COUNTER MEDICATION Liga-complex-take one tablet two to three times daily     pantoprazole (PROTONIX) 40 MG tablet Take 1 tablet (40 mg total) by mouth daily. 30 tablet 0   valsartan-hydrochlorothiazide (DIOVAN-HCT) 160-12.5 MG tablet Take 1 tablet by mouth daily. 90 tablet 3   No current facility-administered medications on file prior to visit.    Past Medical History:  Diagnosis Date   Anemia    Anxiety    no meds   Coronary artery calcification seen on CT scan 07/08/2021   Coronary Calcium Score = 212, LM 144, LAD 68.  Noncardiac findings: Aortic atherosclerosis, small pericardial effusion.   COVID-19    2022   Dyslipidemia 05/08/2013   Essential hypertension 05/08/2013   Glaucoma    Palpitations 05/08/2013   Hx    SVD (spontaneous vaginal delivery)    x 1   Allergies  Allergen Reactions   Citalopram Swelling and Other (See Comments)    Mouth swells    Social History   Socioeconomic History   Marital status: Married    Spouse name: Not on file   Number of children:  Not on file   Years of education: Not on file   Highest education level: 12th grade  Occupational History   Not on file  Tobacco Use   Smoking status: Never   Smokeless tobacco: Never  Vaping Use   Vaping Use: Never used  Substance and Sexual Activity   Alcohol use: No   Drug use: No   Sexual activity: Not Currently    Birth control/protection: Post-menopausal  Other Topics Concern   Not on file  Social History Narrative   Single mother of one, grandmother 62, great-grandmother 2.   Does not smoke, does not drink.   Exercises usually on treadmill  mostly 3 days a week.   Social Determinants of Health   Financial Resource Strain: Low Risk  (10/22/2021)   Overall Financial Resource Strain (CARDIA)    Difficulty of Paying Living Expenses: Not hard at all  Food Insecurity: No Food Insecurity (10/22/2021)   Hunger Vital Sign    Worried About Running Out of Food in the Last Year: Never true    Ran Out of Food in the Last Year: Never true  Transportation Needs: No Transportation Needs (10/22/2021)   PRAPARE - Hydrologist (Medical): No    Lack of Transportation (Non-Medical): No  Physical Activity: Sufficiently Active (10/22/2021)   Exercise Vital Sign    Days of Exercise per Week: 4 days    Minutes of Exercise per Session: 40 min  Stress: No Stress Concern Present (10/22/2021)   [place]    Feeling of Stress : Not at all  Social Connections: [place] (10/22/2021)   Social Connection and Isolation Panel [NHANES]    Frequency of Communication with Friends and Family: More than three times a week    Frequency of Social Gatherings with Friends and Family: More than three times a week    Attends Religious Services: More than 4 times per year    Active Member of Clubs or Organizations: Yes    Attends Archivist Meetings: More than 4 times per year    Marital Status: Married    Vitals:   06/03/22 1236  BP: 124/70  Pulse: 79  Temp: 98.5 F (36.9 C)  SpO2: 97%   Body mass index is 26.17 kg/m.  Physical Exam Vitals and nursing note reviewed.  Constitutional:      General: She is not in acute distress.    Appearance: She is well-developed.  HENT:     Head: Normocephalic and atraumatic.     Mouth/Throat:     Mouth: Mucous membranes are moist.     Pharynx: Oropharynx is clear.  Eyes:     Conjunctiva/sclera: Conjunctivae normal.  Cardiovascular:     Rate and Rhythm: Normal rate and regular rhythm.     Pulses:           Dorsalis pedis pulses are 2+ on the right side and 2+ on the left side.     Heart sounds: No murmur heard. Pulmonary:     Effort: Pulmonary effort is normal. No respiratory distress.     Breath sounds: Normal breath sounds.  Abdominal:     Palpations: Abdomen is soft. There is no hepatomegaly or mass.     Tenderness: There is abdominal tenderness in the epigastric area.  Lymphadenopathy:     Cervical: No cervical adenopathy.  Skin:    General: Skin is warm.     Findings: No erythema or  rash.  Neurological:     General: No focal deficit present.     Mental Status: She is alert and oriented to person, place, and time.     Cranial Nerves: No cranial nerve deficit.     Gait: Gait normal.  Psychiatric:     Comments: Well groomed, good eye contact.     ASSESSMENT AND PLAN: Epigastric abdominal pain  Dyspepsia  Elevated blood protein  Steatosis of liver  Return if symptoms worsen or fail to improve, for keep next appointment.  Jacynda Brunke G. [place], MD  [facility]. [place] office.

## 2022-06-03 NOTE — Patient Instructions (Signed)
A few things to remember from today's visit:  Epigastric abdominal pain  Dyspepsia  Elevated blood protein  Steatosis of liver  Start Protonix (pantoprazole) 30 min before breakfast for 6-8 weeks. If still having discomfort, gastro evaluation will be necessary. Will plan on rechecking labs during your physical.  If you need refills for medications you take chronically, please call your pharmacy. Do not use My Chart to request refills or for acute issues that need immediate attention. If you send a my chart message, it may take a few days to be addressed, specially if I am not in the office.  Please be sure medication list is accurate. If a new problem present, please set up appointment sooner than planned today.

## 2022-06-03 NOTE — Telephone Encounter (Signed)
error 

## 2022-06-04 ENCOUNTER — Encounter: Payer: Self-pay | Admitting: Family Medicine

## 2022-06-04 NOTE — Assessment & Plan Note (Signed)
Continue Atorvastatin 10 mg daily. Last LDL 86 in 11/2021.

## 2022-06-04 NOTE — Assessment & Plan Note (Signed)
We discussed Dx,prognosis,and treatment options. For now recommend avoiding alcohol intake, continue low fat diet,and engage in regular physical activity. LFT's has been normal.

## 2022-06-06 IMAGING — CT CT CARDIAC CORONARY ARTERY CALCIUM SCORE
3 series · 14 of 20 positions shown, 16 images · non-contrast
Comparison: None Available.
COMPARISON: None Available.

Addendum:
CLINICAL DATA: This over-read does not include interpretation of
cardiac or coronary anatomy or pathology. The coronary calcium score
interpretation by the cardiologist is attached.
CLINICAL DATA: 70F for cardiovascular disease risk stratification

EXAM:
Coronary Calcium Score
TECHNIQUE: A gated, non-contrast computed tomography scan of the heart was
performed using 3mm slice thickness. Axial images were analyzed on a
dedicated workstation. Calcium scoring of the coronary arteries was
performed using the Agatston method.

[Series 2: cascseq 2.0 sa36 70% (id) · axial · 0.39mm/px · z∈[-170,-100]mm · 4 of 59 slices shown]
[im 12/59  vessel]
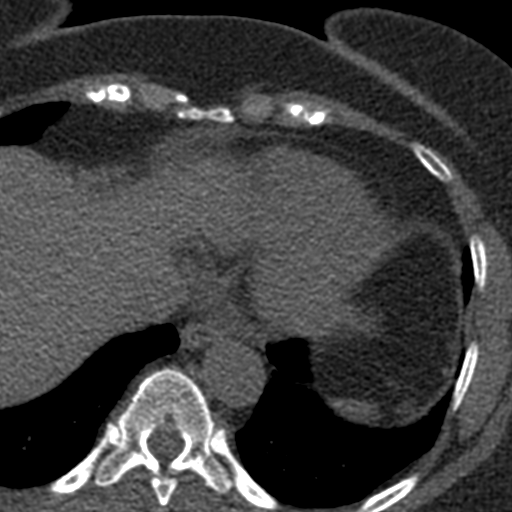
[im 24/59  vessel]
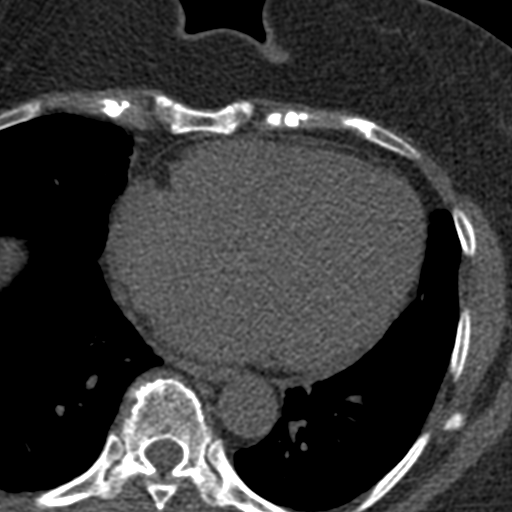
[im 35/59  vessel]
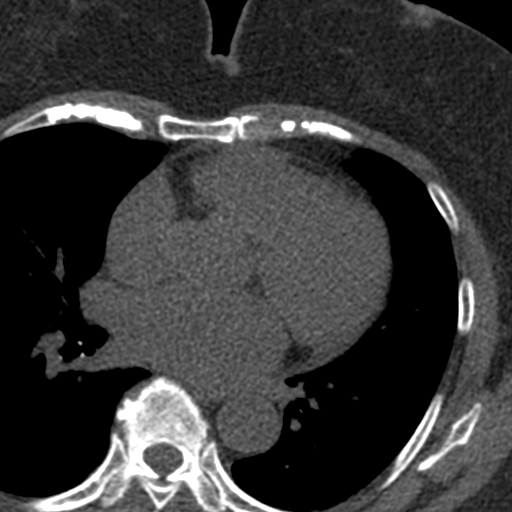
[im 47/59  vessel]
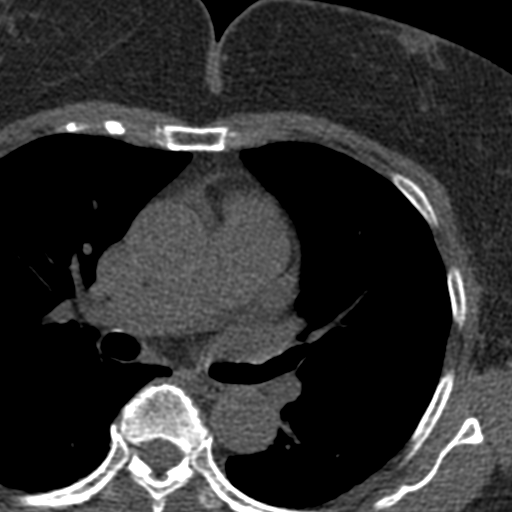

[Series 3: cascseq 2.0 bf37 st · axial · 0.61mm/px · z∈[-174,-96]mm · 5 of 59 slices shown, 7 images]
[im 10/59  vessel]
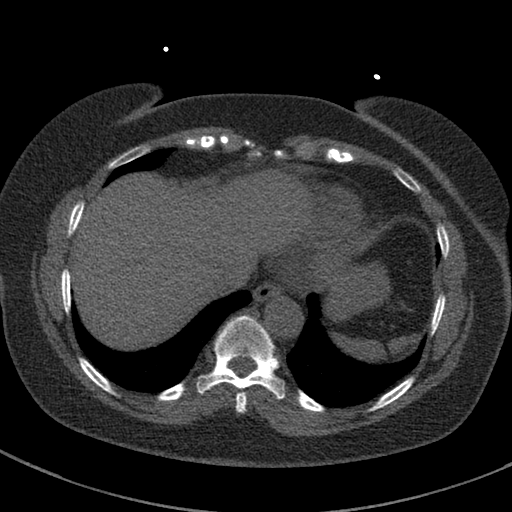
[im 10/59  lung]
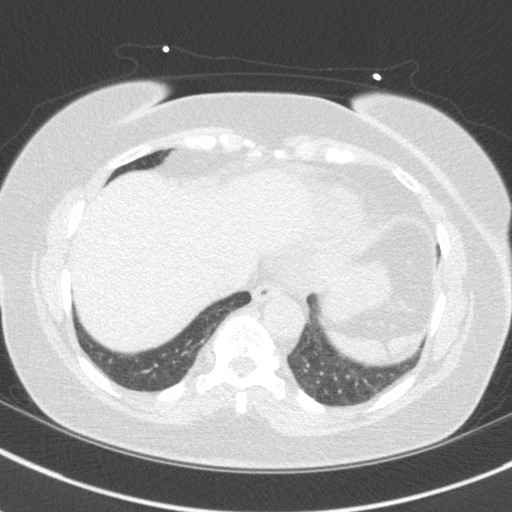
[im 20/59  vessel]
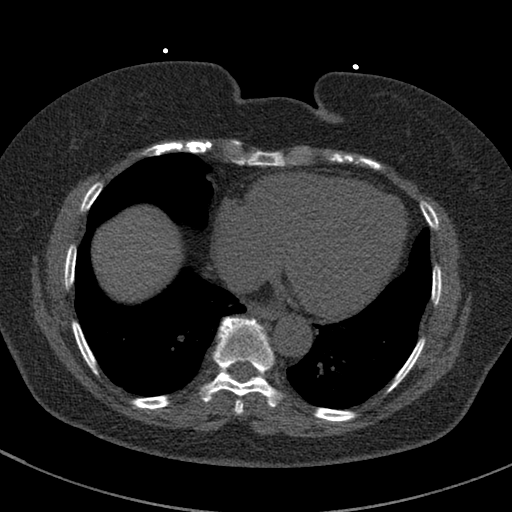
[im 30/59  vessel]
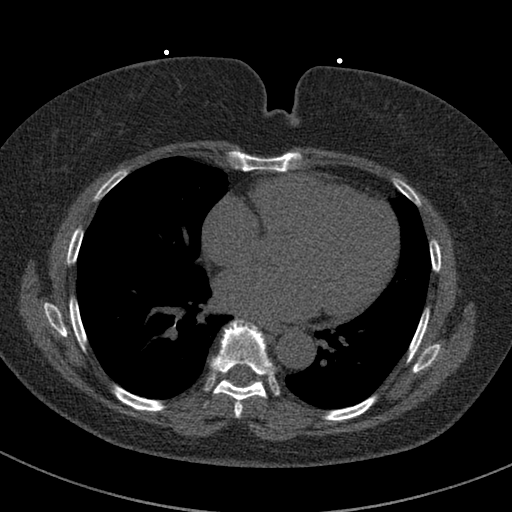
[im 39/59  vessel]
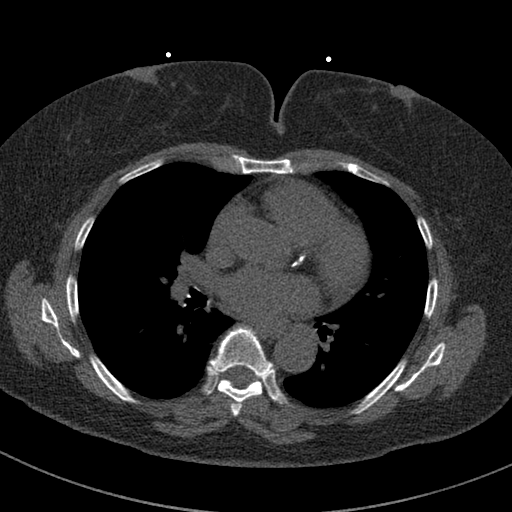
[im 49/59  vessel]
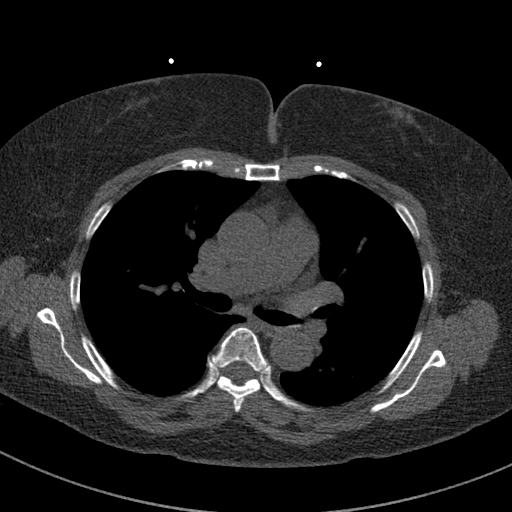
[im 49/59  lung]
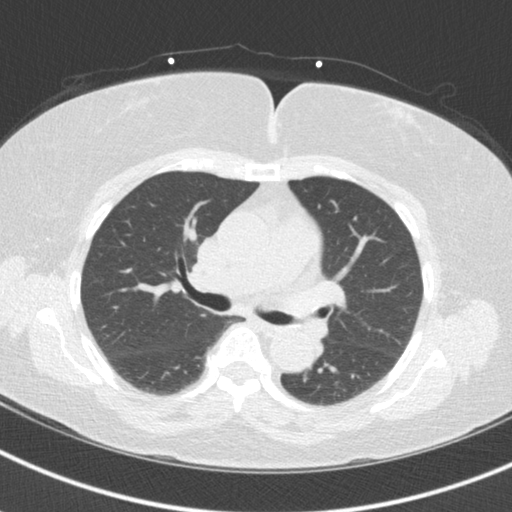

[Series 4: cascseq 2.0 br59 lung · axial · 0.61mm/px · z∈[-174,-96]mm · 5 of 59 slices shown]
[im 10/59  lung]
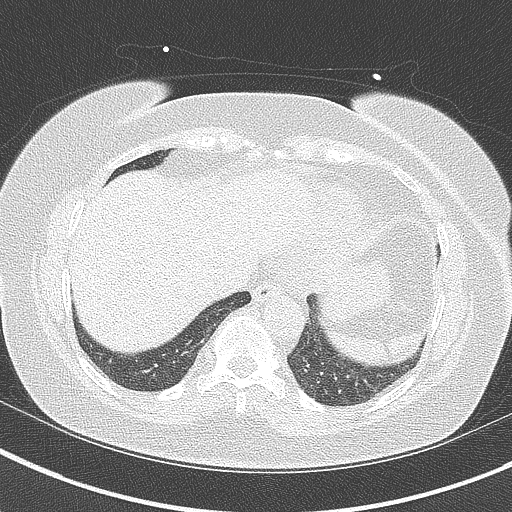
[im 20/59  lung]
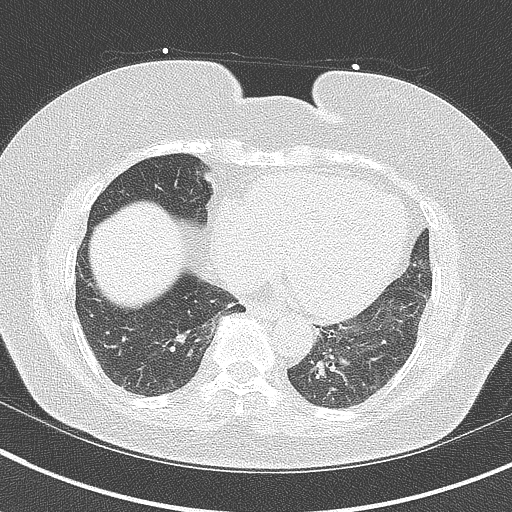
[im 30/59  lung]
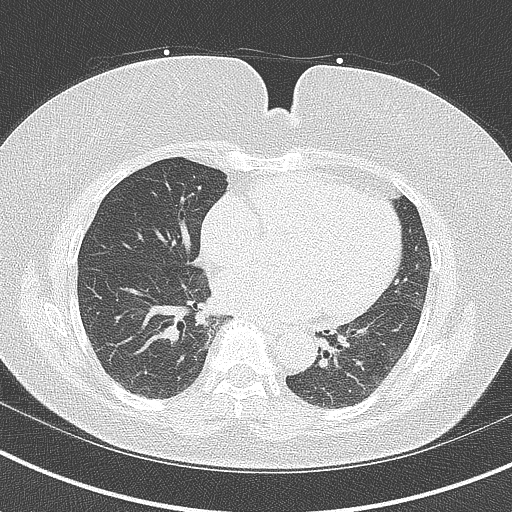
[im 39/59  lung]
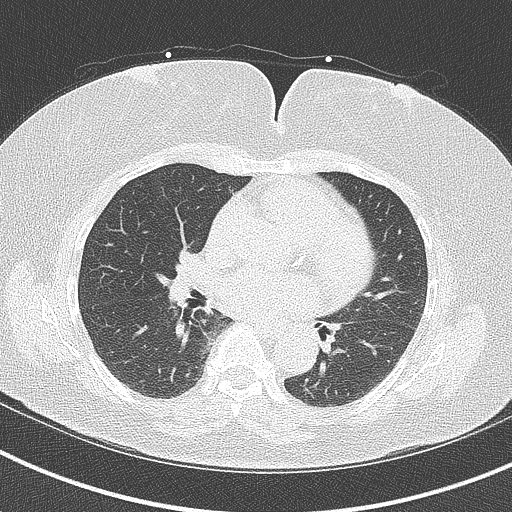
[im 49/59  lung]
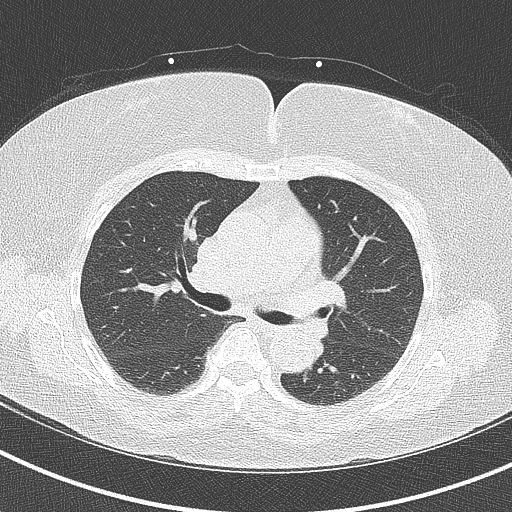

[14 of 20 positions shown; findings below may reference images not displayed]

FINDINGS: Vascular: Aortic atherosclerosis.  Small pericardial effusion.

Mediastinum/Nodes: Prominent mediastinal lymph nodes for instance a
subcarinal lymph node measuring 9 mm in short axis on image [DATE].

Lungs/Pleura: Bibasilar atelectasis/scarring. No pleural effusion.
No pneumothorax.

Upper Abdomen: Limited view of the abdomen reveals no acute
abnormality.

Musculoskeletal: No acute osseous abnormality. Thoracic spondylosis.
IMPRESSION: 1. Small pericardial effusion.
2. Prominent mediastinal lymph nodes favored reactive.
3.  Aortic Atherosclerosis ([id number]-[id number].W).
FINDINGS: Coronary arteries: Normal origins.

Coronary Calcium Score: 212

Left main: 144

Left anterior descending artery:

Left circumflex artery: 0

Right coronary artery: 0

Total: 212

Percentile: 88th

Pericardium: Normal.

Ascending Aorta: Normal caliber.

Non-cardiac: See separate report from [REDACTED].
IMPRESSION: Coronary calcium score of 212. This was 88th percentile for age-,
race-, and sex-matched controls.



If CAC=0, it is reasonable to withhold statin therapy and reassess
in 5 to 10 years, as long as higher risk conditions are absent
(diabetes mellitus, family history of premature CHD in first degree
relatives (males <55 years; females <65 years), cigarette smoking,
or LDL >=190 mg/dL).

If CAC is 1 to 99, it is reasonable to initiate statin therapy for
patients >=55 years of age.

If CAC is >=100 or >=75th percentile, it is reasonable to initiate
statin therapy at any age.

Cardiology referral should be considered for patients with CAC
scores >=400 or >=75th percentile.

*1844 AHA/ACC/AACVPR/AAPA/ABC/DEYOUNG/BACK/XENIA/[place]/ANTROULLA/GARAYSA/ELIAS
Guideline on the Management of Blood Cholesterol: A Report of the
American College of Cardiology/American Heart Association Task Force
on Clinical Practice Guidelines. J Am Coll Cardiol.
0884;73(24):6858-6661.

*** End of Addendum ***
FINDINGS: Vascular: Aortic atherosclerosis.  Small pericardial effusion.

Mediastinum/Nodes: Prominent mediastinal lymph nodes for instance a
subcarinal lymph node measuring 9 mm in short axis on image [DATE].

Lungs/Pleura: Bibasilar atelectasis/scarring. No pleural effusion.
No pneumothorax.

Upper Abdomen: Limited view of the abdomen reveals no acute
abnormality.

Musculoskeletal: No acute osseous abnormality. Thoracic spondylosis.
IMPRESSION: 1. Small pericardial effusion.
2. Prominent mediastinal lymph nodes favored reactive.
3.  Aortic Atherosclerosis ([id number]-[id number].W).

## 2022-06-24 ENCOUNTER — Other Ambulatory Visit: Payer: Self-pay

## 2022-06-24 ENCOUNTER — Telehealth: Payer: Self-pay | Admitting: Cardiology

## 2022-06-24 MED ORDER — VALSARTAN-HYDROCHLOROTHIAZIDE 160-12.5 MG PO TABS: 1.0000 | ORAL_TABLET | Freq: Every day | ORAL | 3 refills | Status: DC

## 2022-06-24 NOTE — Telephone Encounter (Signed)
RX now sent to preferred pharmacy.  

## 2022-06-24 NOTE — Telephone Encounter (Signed)
*  STAT* If patient is at the pharmacy, call can be transferred to refill team.   1. Which medications need to be refilled? (please list name of each medication and dose if known) valsartan-hydrochlorothiazide (DIOVAN-HCT) 160-12.5 MG tablet    2. Which pharmacy/location (including street and city if local pharmacy) is medication to be sent to? [facility] on [address]  3. Do they need a 30 day or 90 day supply? 90 day

## 2022-08-11 DIAGNOSIS — H401132 Primary open-angle glaucoma, bilateral, moderate stage: Secondary | ICD-10-CM | POA: Diagnosis not present

## 2022-09-08 DIAGNOSIS — Z1231 Encounter for screening mammogram for malignant neoplasm of breast: Secondary | ICD-10-CM | POA: Diagnosis not present

## 2022-09-08 LAB — HM MAMMOGRAPHY

## 2022-10-29 ENCOUNTER — Other Ambulatory Visit: Payer: Self-pay | Admitting: Family Medicine

## 2022-12-22 ENCOUNTER — Telehealth: Payer: Self-pay | Admitting: Family Medicine

## 2022-12-22 DIAGNOSIS — L853 Xerosis cutis: Secondary | ICD-10-CM

## 2022-12-22 NOTE — Telephone Encounter (Signed)
Referral placed.

## 2022-12-22 NOTE — Telephone Encounter (Signed)
Pt has been scheduled for a CPE on 01/22/23. Pt states her skin has become very dry and flaky. Pt would like to request a referral to see a Dermatologist.  Please advise.

## 2023-01-04 ENCOUNTER — Ambulatory Visit: Payer: Medicare Other | Attending: Cardiology | Admitting: Cardiology

## 2023-01-04 NOTE — Progress Notes (Deleted)
  Cardiology Office Note:  .   Date:  01/04/2023  ID:  Fauna, Neuner 09-Aug-1950, MRN [id number] PCP: [place], Betty G, MD  [facility] Providers Cardiologist:  Bryan Lemma, MD { Click to update primary MD,subspecialty MD or APP then REFRESH:1}    No chief complaint on file.   Patient Profile: Nichole Walters     Nichole Walters is a *** 72 y.o. female *** with a PMH notable for *** who presents here for *** at the request of [place], Timoteo Expose, MD.  {There is no content from the last Narrative History section.}      Ceili Ivett Luebbe was last seen on ***  Subjective  Discussed the use of AI scribe software for clinical note transcription with the patient, who gave verbal consent to proceed.  History of Present Illness            Cardiovascular ROS: {roscv:310661}  ROS:  Review of Systems - {ros master:310782}    Objective   Studies Reviewed: Nichole Walters        ECHO: *** CATH: *** MONITOR: *** CT: ***  Risk Assessment/Calculations:   {Does this patient have ATRIAL FIBRILLATION?[phone]} No BP recorded.  {Refresh Note OR Click here to enter BP  :1}***         Physical Exam:   VS:  There were no vitals taken for this visit.   Wt Readings from Last 3 Encounters:  06/03/22 134 lb (60.8 kg)  05/26/22 136 lb (61.7 kg)  03/24/22 141 lb (64 kg)    GEN: Well nourished, well developed in no acute distress; *** NECK: No JVD; No carotid bruits CARDIAC: Normal S1, S2; RRR, no murmurs, rubs, gallops RESPIRATORY:  Clear to auscultation without rales, wheezing or rhonchi ; nonlabored, good air movement. ABDOMEN: Soft, non-tender, non-distended EXTREMITIES:  No edema; No deformity      ASSESSMENT AND PLAN: .    Problem List Items Addressed This Visit   None    Assessment and Plan                 {Are you ordering a CV Procedure (e.g. stress test, cath, DCCV, TEE, etc)?   Press F2        :[id number]}   Follow-Up: No follow-ups on  file.  Total time spent: *** min spent with patient + *** min spent charting = *** min      Signed, Marykay Lex, MD, MS Bryan Lemma, M.D., M.S. Interventional Cardiologist  [facility]  Pager # [phone] Phone # [phone] [address] [place], [place] [address]

## 2023-01-05 ENCOUNTER — Encounter: Payer: Self-pay | Admitting: Cardiology

## 2023-01-11 DIAGNOSIS — H401132 Primary open-angle glaucoma, bilateral, moderate stage: Secondary | ICD-10-CM | POA: Diagnosis not present

## 2023-01-11 DIAGNOSIS — H35033 Hypertensive retinopathy, bilateral: Secondary | ICD-10-CM | POA: Diagnosis not present

## 2023-01-22 ENCOUNTER — Ambulatory Visit: Payer: Medicare Other | Admitting: Family Medicine

## 2023-01-22 ENCOUNTER — Encounter: Payer: Self-pay | Admitting: Family Medicine

## 2023-01-22 VITALS — BP 122/70 | HR 67 | Temp 98.6°F | Resp 16 | Ht 60.0 in | Wt 131.2 lb

## 2023-01-22 DIAGNOSIS — E049 Nontoxic goiter, unspecified: Secondary | ICD-10-CM

## 2023-01-22 DIAGNOSIS — I10 Essential (primary) hypertension: Secondary | ICD-10-CM | POA: Diagnosis not present

## 2023-01-22 DIAGNOSIS — R7303 Prediabetes: Secondary | ICD-10-CM | POA: Diagnosis not present

## 2023-01-22 DIAGNOSIS — E559 Vitamin D deficiency, unspecified: Secondary | ICD-10-CM

## 2023-01-22 DIAGNOSIS — E785 Hyperlipidemia, unspecified: Secondary | ICD-10-CM

## 2023-01-22 DIAGNOSIS — Z0000 Encounter for general adult medical examination without abnormal findings: Secondary | ICD-10-CM

## 2023-01-22 LAB — COMPREHENSIVE METABOLIC PANEL
ALT: 15 U/L (ref 0–35)
AST: 22 U/L (ref 0–37)
Albumin: 4.3 g/dL (ref 3.5–5.2)
Alkaline Phosphatase: 57 U/L (ref 39–117)
BUN: 15 mg/dL (ref 6–23)
CO2: 31 meq/L (ref 19–32)
Calcium: 10.2 mg/dL (ref 8.4–10.5)
Chloride: 102 meq/L (ref 96–112)
Creatinine, Ser: 0.71 mg/dL (ref 0.40–1.20)
GFR: 86.17 mL/min (ref >60.00)
Glucose, Bld: 82 mg/dL (ref 70–99)
Potassium: 3.7 meq/L (ref 3.5–5.1)
Sodium: 139 meq/L (ref 135–145)
Total Bilirubin: 0.5 mg/dL (ref 0.2–1.2)
Total Protein: 7.8 g/dL (ref 6.0–8.3)

## 2023-01-22 LAB — LIPID PANEL
Cholesterol: 148 mg/dL (ref 0–200)
HDL: 46 mg/dL (ref >39.00)
LDL Cholesterol: 91 mg/dL (ref 0–99)
NonHDL: 102.19
Total CHOL/HDL Ratio: 3
Triglycerides: 58 mg/dL (ref 0.0–149.0)
VLDL: 11.6 mg/dL (ref 0.0–40.0)

## 2023-01-22 LAB — HEMOGLOBIN A1C: Hgb A1c MFr Bld: 6 % (ref 4.6–6.5)

## 2023-01-22 LAB — VITAMIN D 25 HYDROXY (VIT D DEFICIENCY, FRACTURES): VITD: 54.74 ng/mL (ref 30.00–100.00)

## 2023-01-22 NOTE — Assessment & Plan Note (Signed)
Stable. Last TSH 1.9 in 07/2021.

## 2023-01-22 NOTE — Assessment & Plan Note (Signed)
Continue atorvastatin 10 mg daily and low-fat diet. Last LDL 86 in 11/2021.

## 2023-01-22 NOTE — Assessment & Plan Note (Signed)
We discussed the importance of regular physical activity and healthy diet for prevention of chronic illness and/or complications. Preventive guidelines reviewed. Vaccination: Declined flu vaccine. Ca++ and vit D supplementation to continue. Next CPE in a year.

## 2023-01-22 NOTE — Progress Notes (Unsigned)
HPI: [name] is a 72 y.o. female, who is with PMHx significant for HLD, HTN, aortic atherosclerosis, PAD, vit D deficiency, and chronic back pain here today for her routine physical.  Last CPE: 01/19/22  Exercise: Walking at least 4 times a week for over 30-60 minutes.  Diet: overall Eating vegetables. Has fish, chicken, and red meat but eats little beef.  Sleep: Does not usually sleep that well, which she attributes to working 3rd shift for many years.  Smoking: Never.  Alcohol consumption: None.   Dental: UTD, had routine dental visit last week.  Vision: UTD, had a routine Went last week . Goes 3 times a week to follow up with glaucoma. Taking latanoprost to manage her glaucoma.   Colonoscopy is UTD, was done in 12/2020. Next due 2027. She reports a positive FMHX of colon cancer in 3 family members.  Pt is established with Dr. Annamaria Walters of gynecology. Was last seen in 08/2022 for mammogram.   Chronic medical problems:  Hypertension:  Medications: Amlodipine 5 mg and valsartan-hydrochlorothiazide 160-12.5 mg daily. Side effects: None.  Established with cardiology, follows up 1-2 times a year. Recently missed her last appointment, but plans to reschedule soon.  She endorses occasional palpitations, for which she has worn a heart monitor in the past. This is being monitored by cardiology.  Negative for unusual or severe headache, visual changes, exertional chest pain, dyspnea, focal weakness, or edema.  Lab Results  Component Value Date   NA 137 05/26/2022   CL 102 05/26/2022   K 3.6 05/26/2022   CO2 25 05/26/2022   BUN 12 05/26/2022   CREATININE 0.61 05/26/2022   GFRNONAA >60 05/26/2022   CALCIUM 9.5 05/26/2022   ALBUMIN 4.3 05/26/2022   GLUCOSE 137 (H) 05/26/2022   Hyperlipidemia: PAD and elevated coronary artery calcium score. Currently on atorvastatin 10 mg.  Following a low fat diet: yes Side effects from medication: none.  Lab Results   Component Value Date   CHOL 151 12/05/2021   HDL 52 12/05/2021   LDLCALC 86 12/05/2021   TRIG 63 12/05/2021   CHOLHDL 2.9 12/05/2021   Vitamin D Deficiency: Taking vitamin D supplements and calcium carbonate. Bone density scan is UTD, last done on 04/09/2022.  Lab Results  Component Value Date   VD25OH 25.26 (L) 01/01/2021   Pre-Diabetes: - new dx Pt reports an A1C of 6.4 during her last routine at homehealth visits. She states her A1C typically has been 5.7 or 5.8 in the past. Of note, her blood glucose was elevated at 137 during he recent hospitalization.   Hx of fatty liver - dx in 2019 Hx of enlarged thyroid.  Lab Results  Component Value Date   TSH 1.92 08/07/2021   Immunization History  Administered Date(s) Administered   Hep A / Hep B 03/09/2017, 04/12/2017, 09/06/2017   PFIZER Comirnaty(Gray Top)Covid-19 Tri-Sucrose Vaccine 04/24/2019, 05/15/2019, 12/04/2019   Pfizer Covid-19 Vaccine Bivalent Booster 93yrs & up 02/26/2021   Pfizer(Comirnaty)Fall Seasonal Vaccine 12 years and older 11/17/2022   Pneumococcal Conjugate-13 09/06/2017   Pneumococcal Polysaccharide-23 12/28/2018   Tdap 07/17/2016   Zoster Recombinant(Shingrix) 04/01/2021, 07/19/2021   Health Maintenance  Topic Date Due   INFLUENZA VACCINE  05/31/2023 (Originally 10/01/2022)   COVID-19 Vaccine (6 - 2023-24 season) 03/19/2023   Medicare Annual Wellness (AWV)  03/25/2023   MAMMOGRAM  09/07/2024   DTaP/Tdap/Td (2 - Td or Tdap) 07/18/2026   Colonoscopy  01/30/2031   Pneumonia Vaccine 67+ Years old  Completed  DEXA SCAN  Completed   Hepatitis C Screening  Completed   Zoster Vaccines- Shingrix  Completed   HPV VACCINES  Aged Out   Dry skin: Pt complains of dry skin after using a body wash she was gifted from [employer]. She also attributes her dry skin to aging. Recently she purchased CeraVe lotion to help moisturize her skin.   Review of Systems  Constitutional:  Negative for appetite change and  fever.  HENT:  Negative for mouth sores, sore throat and trouble swallowing.   Eyes:  Negative for redness and visual disturbance.  Respiratory:  Negative for cough, shortness of breath and wheezing.   Cardiovascular:  Positive for palpitations. Negative for chest pain and leg swelling.  Gastrointestinal:  Negative for abdominal pain, nausea and vomiting.  Endocrine: Negative for cold intolerance, heat intolerance, polydipsia, polyphagia and polyuria.  Genitourinary:  Negative for decreased urine volume, dysuria, hematuria, vaginal bleeding and vaginal discharge.  Musculoskeletal:  Positive for arthralgias. Negative for gait problem.  Skin:  Negative for color change and rash.  Allergic/Immunologic: Negative for environmental allergies.  Neurological:  Negative for syncope, weakness and headaches.  Hematological:  Negative for adenopathy. Does not bruise/bleed easily.  Psychiatric/Behavioral:  Negative for confusion, hallucinations and sleep disturbance.   All other systems reviewed and are negative.  Current Outpatient Medications on File Prior to Visit  Medication Sig Dispense Refill   amLODipine (NORVASC) 5 MG tablet TAKE 1 TABLET (5 MG TOTAL) BY MOUTH DAILY. 90 tablet 2   atorvastatin (LIPITOR) 10 MG tablet Take 1 tablet (10 mg total) by mouth at bedtime. 90 tablet 3   calcium carbonate (CALTRATE 600) 1500 (600 Ca) MG TABS tablet Take 600 mg of elemental calcium by mouth 2 (two) times daily with a meal.     latanoprost (XALATAN) 0.005 % ophthalmic solution Place 1 drop into both eyes at bedtime.     OVER THE COUNTER MEDICATION Liga-complex-take one tablet two to three times daily     valsartan-hydrochlorothiazide (DIOVAN-HCT) 160-12.5 MG tablet Take 1 tablet by mouth daily. 90 tablet 3   No current facility-administered medications on file prior to visit.   Past Medical History:  Diagnosis Date   Anemia    Anxiety    no meds   Coronary artery calcification seen on CT scan  07/08/2021   Coronary Calcium Score = 212, LM 144, LAD 68.  Noncardiac findings: Aortic atherosclerosis, small pericardial effusion.   COVID-19    2022   Dyslipidemia 05/08/2013   Essential hypertension 05/08/2013   Glaucoma    Palpitations 05/08/2013   Hx    SVD (spontaneous vaginal delivery)    x 1    Past Surgical History:  Procedure Laterality Date   COLONOSCOPY     EXCISION MASS NECK     HYSTEROSCOPY N/A 07/22/2015   Procedure: HYSTEROSCOPY;  Surgeon: Ilda Mori, MD;  Location: [facility];  Service: Gynecology;  Laterality: N/A;   LEFT HEART CATH AND CORONARY ANGIOGRAPHY  04/25/2001   Dr. Clarene Duke: Minimal irregularities in the LCx.  Intramyocardial segment of the LAD with tenting in the segment that becomes epicardial no fixed lesions; EF > 60%, ? MVP without MR.-FALSE POSITIVE STRESS TEST   NM MYOVIEW LTD  02/13/2009   EF 70%  Low risk scan   NM PERSANTINE MYOVIEW LTD  01/30/2009   EF> 70%. No ischemia or infarction   ROBOTIC ASSISTED TOTAL HYSTERECTOMY WITH BILATERAL SALPINGO OOPHERECTOMY N/A 10/22/2020   Procedure: XI ROBOTIC ASSISTED  TOTAL HYSTERECTOMY WITH BILATERAL SALPINGO OOPHORECTOMY;  Surgeon: Adolphus Birchwood, MD;  Location: [facility];  Service: Gynecology;  Laterality: N/A;   TOE SURGERY     TRANSTHORACIC ECHOCARDIOGRAM  01/30/2009   Normal LV size and function. Mild MR and TR. Otherwise normal. Normal diastolic function.   WISDOM TOOTH EXTRACTION      Allergies  Allergen Reactions   Citalopram Swelling and Other (See Comments)    Mouth swells    Family History  Problem Relation Age of Onset   Colon cancer Sister    Colon cancer Brother    Colon cancer Cousin     Social History   Socioeconomic History   Marital status: Married    Spouse name: Not on file   Number of children: Not on file   Years of education: Not on file   Highest education level: 12th grade  Occupational History   Not on file  Tobacco Use   Smoking status: Never   Smokeless tobacco:  Never  Vaping Use   Vaping status: Never Used  Substance and Sexual Activity   Alcohol use: No   Drug use: No   Sexual activity: Not Currently    Birth control/protection: Post-menopausal  Other Topics Concern   Not on file  Social History Narrative   Single mother of one, grandmother 3, great-grandmother 2.   Does not smoke, does not drink.   Exercises usually on treadmill mostly 3 days a week.   Social Determinants of Health   Financial Resource Strain: Low Risk  (10/22/2021)   Overall Financial Resource Strain (CARDIA)    Difficulty of Paying Living Expenses: Not hard at all  Food Insecurity: No Food Insecurity (10/22/2021)   Hunger Vital Sign    Worried About Running Out of Food in the Last Year: Never true    Ran Out of Food in the Last Year: Never true  Transportation Needs: No Transportation Needs (10/22/2021)   PRAPARE - Administrator, Civil Service (Medical): No    Lack of Transportation (Non-Medical): No  Physical Activity: Sufficiently Active (10/22/2021)   Exercise Vital Sign    Days of Exercise per Week: 4 days    Minutes of Exercise per Session: 40 min  Stress: No Stress Concern Present (10/22/2021)   [employer] of Occupational Health - Occupational Stress Questionnaire    Feeling of Stress : Not at all  Social Connections: Socially Integrated (10/22/2021)   Social Connection and Isolation Panel [NHANES]    Frequency of Communication with Friends and Family: More than three times a week    Frequency of Social Gatherings with Friends and Family: More than three times a week    Attends Religious Services: More than 4 times per year    Active Member of [employer] or Organizations: Yes    Attends Banker Meetings: More than 4 times per year    Marital Status: Married   Vitals:   01/22/23 0945  BP: 122/70  Pulse: 67  Resp: 16  Temp: 98.6 F (37 C)  SpO2: 97%   Body mass index is 25.63 kg/m.  Wt Readings from Last 3 Encounters:   01/22/23 131 lb 4 oz (59.5 kg)  06/03/22 134 lb (60.8 kg)  05/26/22 136 lb (61.7 kg)   Physical Exam Vitals and nursing note reviewed.  Constitutional:      General: She is not in acute distress.    Appearance: She is well-developed.  HENT:     Head: Normocephalic and  atraumatic.     Right Ear: Tympanic membrane, ear canal and external ear normal.     Left Ear: Tympanic membrane, ear canal and external ear normal.     Mouth/Throat:     Mouth: Mucous membranes are moist.     Pharynx: Oropharynx is clear. Uvula midline.  Eyes:     Conjunctiva/sclera: Conjunctivae normal.     Pupils: Pupils are equal, round, and reactive to light.  Neck:     Thyroid: Thyromegaly (mild) present. No thyroid mass.  Cardiovascular:     Rate and Rhythm: Normal rate and regular rhythm.     Heart sounds: No murmur heard.    Comments: DP pulses palpable. Pulmonary:     Effort: Pulmonary effort is normal. No respiratory distress.     Breath sounds: Normal breath sounds.  Abdominal:     Palpations: Abdomen is soft. There is no hepatomegaly or mass.     Tenderness: There is no abdominal tenderness.  Genitourinary:    Comments: No concerns today. Musculoskeletal:     Comments: No signs of synovitis appreciated.  Lymphadenopathy:     Cervical: No cervical adenopathy.  Skin:    General: Skin is warm.     Findings: No erythema or rash.  Neurological:     General: No focal deficit present.     Mental Status: She is alert and oriented to person, place, and time.     Cranial Nerves: No cranial nerve deficit.     Gait: Gait normal.     Deep Tendon Reflexes:     Reflex Scores:      Bicep reflexes are 2+ on the right side and 2+ on the left side.      Patellar reflexes are 2+ on the right side and 2+ on the left side. Psychiatric:        Mood and Affect: Mood and affect normal.   ASSESSMENT AND PLAN: [name] was here today annual physical examination.  Orders Placed This  Encounter  Procedures   Hemoglobin A1c   Lipid panel   Comprehensive metabolic panel   VITAMIN D 25 Hydroxy (Vit-D Deficiency, Fractures)   Lab Results  Component Value Date   HGBA1C 6.0 01/22/2023   Lab Results  Component Value Date   NA 139 01/22/2023   CL 102 01/22/2023   K 3.7 01/22/2023   CO2 31 01/22/2023   BUN 15 01/22/2023   CREATININE 0.71 01/22/2023   GFR 86.17 01/22/2023   CALCIUM 10.2 01/22/2023   ALBUMIN 4.3 01/22/2023   GLUCOSE 82 01/22/2023   Lab Results  Component Value Date   ALT 15 01/22/2023   AST 22 01/22/2023   ALKPHOS 57 01/22/2023   BILITOT 0.5 01/22/2023   Lab Results  Component Value Date   CHOL 148 01/22/2023   HDL 46.00 01/22/2023   LDLCALC 91 01/22/2023   TRIG 58.0 01/22/2023   CHOLHDL 3 01/22/2023   Routine general medical examination at a health care facility Assessment & Plan: We discussed the importance of regular physical activity and healthy diet for prevention of chronic illness and/or complications. Preventive guidelines reviewed. Vaccination: Declined flu vaccine. Ca++ and vit D supplementation to continue. Next CPE in a year.   Essential hypertension Assessment & Plan: BP adequately controlled, reporting similar readings at home. Continue Amlodipine 5 mg daily and valsartan-HCTZ 160-12.5 mg daily as well as low-salt diet. Continue monitoring BP regularly. Eye exam is current. She follows with cardiology 1-2 times per year.  Vitamin D deficiency, unspecified Assessment & Plan: Continue current dose of vitamin D supplementation. Further recommendation will be given according to 25 OH vitamin D result.  Orders: -     VITAMIN D 25 Hydroxy (Vit-D Deficiency, Fractures); Future  Prediabetes Assessment & Plan: Encouraged consistency with a healthy lifestyle for diabetes prevention. Reporting hemoglobin A1c of 6.4 when done during home health visit.  Orders: -     Hemoglobin A1c; Future  Enlarged thyroid  gland Assessment & Plan: Stable. Last TSH 1.9 in 07/2021.   Hyperlipidemia with target LDL less than 70 Assessment & Plan: Continue atorvastatin 10 mg daily and low-fat diet. Last LDL 86 in 11/2021.  Orders: -     Lipid panel; Future -     Comprehensive metabolic panel; Future  Return in 1 year (on 01/22/2024) for CPE.  I, Isabelle Course, acting as a scribe for Jyssica Rief [place], MD., have documented all relevant documentation on the behalf of Raelea Gosse [place], MD, as directed by  Hamzah Savoca [place], MD while in the presence of Vittorio Mohs [place], MD.  I, Tressia Labrum [place], MD, have reviewed all documentation for this visit. The documentation on 01/22/23 for the exam, diagnosis, procedures, and orders are all accurate and complete.  Murl Zogg G. [place], MD  [facility]. [place] office.

## 2023-01-22 NOTE — Assessment & Plan Note (Signed)
Encouraged consistency with a healthy lifestyle for diabetes prevention. Reporting hemoglobin A1c of 6.4 when done during home health visit.

## 2023-01-22 NOTE — Assessment & Plan Note (Signed)
BP adequately controlled, reporting similar readings at home. Continue Amlodipine 5 mg daily and valsartan-HCTZ 160-12.5 mg daily as well as low-salt diet. Continue monitoring BP regularly. Eye exam is current. She follows with cardiology 1-2 times per year.

## 2023-01-22 NOTE — Assessment & Plan Note (Signed)
Continue current dose of vitamin D supplementation. Further recommendation will be given according to 25 OH vitamin D result.

## 2023-01-22 NOTE — Patient Instructions (Addendum)
A few things to remember from today's visit:  Routine general medical examination at a health care facility  Essential hypertension  Vitamin D deficiency, unspecified - Plan: VITAMIN D 25 Hydroxy (Vit-D Deficiency, Fractures)  Prediabetes - Plan: Hemoglobin A1c  Enlarged thyroid gland  Hyperlipidemia with target LDL less than 70 - Plan: Lipid panel, Comprehensive metabolic panel  For dry skin you can use Cetaphil or Eucerin.  If you need refills for medications you take chronically, please call your pharmacy. Do not use My Chart to request refills or for acute issues that need immediate attention. If you send a my chart message, it may take a few days to be addressed, specially if I am not in the office.  Please be sure medication list is accurate. If a new problem present, please set up appointment sooner than planned today.

## 2023-02-25 ENCOUNTER — Other Ambulatory Visit: Payer: Self-pay | Admitting: Cardiology

## 2023-03-22 ENCOUNTER — Other Ambulatory Visit: Payer: Self-pay | Admitting: Cardiology

## 2023-04-02 ENCOUNTER — Ambulatory Visit: Payer: Medicare Other | Attending: Cardiology | Admitting: Cardiology

## 2023-04-02 VITALS — BP 122/62 | HR 60 | Ht 60.0 in | Wt 131.0 lb

## 2023-04-02 DIAGNOSIS — E785 Hyperlipidemia, unspecified: Secondary | ICD-10-CM | POA: Diagnosis not present

## 2023-04-02 DIAGNOSIS — I10 Essential (primary) hypertension: Secondary | ICD-10-CM | POA: Diagnosis not present

## 2023-04-02 DIAGNOSIS — R002 Palpitations: Secondary | ICD-10-CM

## 2023-04-02 DIAGNOSIS — R931 Abnormal findings on diagnostic imaging of heart and coronary circulation: Secondary | ICD-10-CM

## 2023-04-02 DIAGNOSIS — I700 Atherosclerosis of aorta: Secondary | ICD-10-CM | POA: Diagnosis not present

## 2023-04-02 MED ORDER — ATORVASTATIN CALCIUM 20 MG PO TABS: 20.0000 mg | ORAL_TABLET | Freq: Every day | ORAL | 3 refills | Status: DC

## 2023-04-02 NOTE — Patient Instructions (Addendum)
Medication Instructions:   Change to  taking Atorvastatin 20 mg  daily  *If you need a refill on your cardiac medications before your next appointment, please call your pharmacy*   Lab Work: Not needed    Testing/Procedures:  Not needed  Follow-Up: At [facility], you and your health needs are our priority.  As part of our continuing mission to provide you with exceptional heart care, we have created designated Provider Care Teams.  These Care Teams include your primary Cardiologist (physician) and Advanced Practice Providers (APPs -  Physician Assistants and Nurse Practitioners) who all work together to provide you with the care you need, when you need it.     Your next appointment:   14 month(s)  ( March 2026)  The format for your next appointment:   In Person  Provider:   Bryan Lemma, MD

## 2023-04-02 NOTE — Progress Notes (Unsigned)
Cardiology Office Note:  .   Date:  04/05/2023  ID:  Nichole Walters, Nichole Walters [birth year], MRN [id number] PCP: [place], Betty G, MD  [facility] Providers Cardiologist:  Bryan Lemma, MD     Chief Complaint  Patient presents with   Follow-up    Annual follow-up.    Patient Profile: Nichole Kitchen     Nichole Walters is a  73 y.o. female  with a PMH notable for HTN, HLD with Coronary Calcium Score of 212, and palpitations as well as bradycardia who presents here for delayed annual follow-up at the request of [place], Betty G, MD.  07/08/2021 Coronary Calcium Score = 212, LM 144, LAD 68.  Noncardiac findings: Aortic atherosclerosis, small pericardial effusion    Nichole Walters was last seen in December 2023 for routine follow-up to discuss Zio patch monitor results (see below).  LDL not at goal.  We titrated up her Lipitor to 20 mg.  BP controlled.  No further bradycardia.  Palpitations improved.  Subjective  Discussed the use of AI scribe software for clinical note transcription with the patient, who gave verbal consent to proceed.  History of Present Illness   The patient is a 73 year old female with hyperlipidemia who presents for follow-up on cholesterol management.  She has experienced a slight increase in her LDL cholesterol levels, although she remains improved compared to two years ago. Her total cholesterol is 148, and her LDL is 91. She is currently taking atorvastatin 10 mg at night (did not increase following last visit). She previously experienced sleepiness with higher doses of statins.  She denies any chest pain, pressure, or tightness during physical activity and remains active, enjoying walking. She experiences occasional palpitations, described as 'flip flop' sensations, mostly occurring when lying down at night. She attributes these to her irregular sleep schedule, often going to bed at 1 or 2 AM. No swelling in her legs, pain when walking, or  episodes of passing out. No stroke-like symptoms. Occasionally, she experiences tingling in her fingers, which she attributes to laying on them wrong.  Her B12 levels are consistently high, ranging from 1000 to 1500, despite not taking B12 supplements. She has a history of working second shift, which has affected her sleep schedule.      No PND, orthopnea or edema.  No syncope or near syncope.  No TIA or amaurosis fugax.  No claudication.     Objective   Medications - Atorvastatin 10 mg - Amlodipine 5 mg - Valsartan and HCTZ 160/12.5 mg  Studies Reviewed: Nichole Kitchen   EKG Interpretation Date/Time:  Friday April 02 2023 11:02:20 EST Ventricular Rate:  60 PR Interval:  170 QRS Duration:  88 QT Interval:  412 QTC Calculation: 412 R Axis:   13  Text Interpretation: Normal sinus rhythm Normal ECG When compared with ECG of 26-May-2022 18:26, PREVIOUS ECG IS PRESENT Confirmed by Bryan Lemma ([id number]) on 04/02/2023 11:30:12 AM    Lab Results  Component Value Date   CHOL 148 01/22/2023   HDL 46.00 01/22/2023   LDLCALC 91 01/22/2023   TRIG 58.0 01/22/2023   CHOLHDL 3 01/22/2023   Lab Results  Component Value Date   NA 139 01/22/2023   K 3.7 01/22/2023   CREATININE 0.71 01/22/2023   GFR 86.17 01/22/2023   GLUCOSE 82 01/22/2023   Lab Results  Component Value Date   HGBA1C 6.0 01/22/2023   Previously reviewed Zio Patch Monitor Zio Patch Wear Time (December 2023):  7 days and 7 hours  Predominant rhythm-Sinus: HR range 46-1 4 bpm. Avg 72 bpm.   Rare PACs and PVCs noted (<1%)) with some PAC couplets and triplets.   14 atrial runs-ranging from 4 to 11 beats.  Fastest was 4 beats with a max rate of 169 bpm, longest was 11 beats with an avg heart rate 120 bpm.   No Sustained Arrhythmias: Atrial Tachycardia (AT), Supraventricular Tachycardia (SVT), Atrial Fibrillation (A-Fib), Atrial Flutter (A-Flutter), Sustained Ventricular Tachycardia (VT)   Symptoms noted mostly with PACs and atrial  runs  Risk Assessment/Calculations:         Physical Exam:   VS:  BP 122/62 (BP Location: Left Arm, Patient Position: Sitting, Cuff Size: Normal)   Pulse 60   Ht 5' (1.524 m)   Wt 131 lb (59.4 kg)   SpO2 96%   BMI 25.58 kg/m    Wt Readings from Last 3 Encounters:  04/02/23 131 lb (59.4 kg)  01/22/23 131 lb 4 oz (59.5 kg)  06/03/22 134 lb (60.8 kg)    GEN: Well nourished, well groomed in no acute distress; healthy-appearing NECK: No JVD; No carotid bruits CARDIAC: Normal S1, S2; RRR, no murmurs, rubs, gallops RESPIRATORY:  Clear to auscultation without rales, wheezing or rhonchi ; nonlabored, good air movement. ABDOMEN: Soft, non-tender, non-distended EXTREMITIES:  No edema; No deformity      ASSESSMENT AND PLAN: .    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 Continue risk factor modification including blood pressure, lipid and glycemic control. LDL not at goal-she did not increase her atorvastatin -Increase atorvastatin 20 mg nightly -Continue amlodipine 5 mg daily along with valsartan-HCTZ 160-12.5 mg daily. -CAC score not high enough to consider aspirin at this point.      Relevant Medications   atorvastatin (LIPITOR) 20 MG tablet   Essential hypertension - Primary (Chronic)   Well-controlled BP on current meds: -Continue amlodipine 5 mg daily and valsartan-HCTZ 160-12.5 mg daily.      Relevant Medications   atorvastatin (LIPITOR) 20 MG tablet   Other Relevant Orders   EKG 12-Lead (Completed)   Hyperlipidemia with target LDL less than 70 (Chronic)   LDL of 91, slightly above the target of <70. Discussed the importance of lowering LDL to minimize the risk of developing plaque build-up and subsequent cardiovascular events. No current side effects from Atorvastatin 10mg . -Increase Atorvastatin to 20mg  at night. -Recheck lipid panel in 3 months to assess response.       Relevant Medications   atorvastatin (LIPITOR) 20 MG tablet   Thoracic aortic atherosclerosis (HCC) (Chronic)   Not unexpected incidental finding in a 73 year old woman with cardiac risk factors.  Continue cardiac CRF modification.      Relevant Medications   atorvastatin (LIPITOR) 20 MG tablet     Other   Palpitations (Chronic)   Occasional palpitations, particularly when lying down and trying to rest. No associated chest pain, pressure, or tightness. No syncope or pre-syncope. Likely benign, possibly related to positional changes or sleep habits. -No change in management. Continue to monitor. -In light of previously having bradycardia, would hold off on rate control AV nodal agents.        Follow-Up: Return in about 14 months (around 05/30/2024) for Routine follow up with me. -Next appointment in March 2026 in order to allow for spacing out of visits between PCP and cardiology.      Crista Elliot  Starr [place], MD, MS Bryan Lemma, M.D., M.S. Interventional Cardiologist  [facility]  Pager # [phone] Phone # [phone] [address] [place], [place] [address]

## 2023-04-05 ENCOUNTER — Encounter: Payer: Self-pay | Admitting: Cardiology

## 2023-04-05 NOTE — Assessment & Plan Note (Signed)
Not unexpected incidental finding in a 73 year old woman with cardiac risk factors.  Continue cardiac CRF modification.

## 2023-04-05 NOTE — Assessment & Plan Note (Signed)
LDL of 91, slightly above the target of <70. Discussed the importance of lowering LDL to minimize the risk of developing plaque build-up and subsequent cardiovascular events. No current side effects from Atorvastatin 10mg . -Increase Atorvastatin to 20mg  at night. -Recheck lipid panel in 3 months to assess response.

## 2023-04-05 NOTE — Assessment & Plan Note (Signed)
Occasional palpitations, particularly when lying down and trying to rest. No associated chest pain, pressure, or tightness. No syncope or pre-syncope. Likely benign, possibly related to positional changes or sleep habits. -No change in management. Continue to monitor. -In light of previously having bradycardia, would hold off on rate control AV nodal agents.

## 2023-04-05 NOTE — Assessment & Plan Note (Signed)
Well-controlled BP on current meds: -Continue amlodipine 5 mg daily and valsartan-HCTZ 160-12.5 mg daily.

## 2023-04-05 NOTE — Assessment & Plan Note (Signed)
No active anginal symptoms. Continue risk factor modification including blood pressure, lipid and glycemic control. LDL not at goal-she did not increase her atorvastatin -Increase atorvastatin 20 mg nightly -Continue amlodipine 5 mg daily along with valsartan-HCTZ 160-12.5 mg daily. -CAC score not high enough to consider aspirin at this point.

## 2023-04-12 ENCOUNTER — Telehealth: Payer: Self-pay | Admitting: Cardiology

## 2023-04-12 DIAGNOSIS — E785 Hyperlipidemia, unspecified: Secondary | ICD-10-CM

## 2023-04-12 DIAGNOSIS — R931 Abnormal findings on diagnostic imaging of heart and coronary circulation: Secondary | ICD-10-CM

## 2023-04-12 NOTE — Telephone Encounter (Signed)
 Pt c/o medication issue:  1. Name of Medication:   atorvastatin  (LIPITOR) 20 MG tablet   2. How are you currently taking this medication (dosage and times per day)?   3. Are you having a reaction (difficulty breathing--STAT)?   4. What is your medication issue?   Patient stated since her dose of this medication was increased she has been feeling sleepy.  Patient wants to know if she can reduce her medication to 10 mg.  Patient stated she will need a new 30-day prescription if changed.

## 2023-04-14 NOTE — Telephone Encounter (Signed)
Recommend that she takes it at night  I would like to see if we can get her on the higher dose to get her cholesterol better controlled.

## 2023-04-15 NOTE — Telephone Encounter (Signed)
Called pt to relay Dr. Elissa Hefty message. Pt sates she has been taking 1.5 tablets (15 mg total) and she is so tired. Pt states "I do go to be late." Pt encouraged to try taking the medication earlier in the day at the proper dose (20 mg nightly) for 2 weeks then call with an update. She verbalized understanding. No further questions at this time.

## 2023-04-17 NOTE — Telephone Encounter (Signed)
If she cannot tolerate the 20 mg, and able to tolerate the 15 mg nightly, thus fine.  If she can increase the dose and tolerated, we will check labs in 3 months, if not at goal, would then recommend adding Nexlizet 180-10 mg.  Bryan Lemma, MD

## 2023-04-20 ENCOUNTER — Other Ambulatory Visit: Payer: Self-pay | Admitting: Cardiology

## 2023-04-21 MED ORDER — ATORVASTATIN CALCIUM 10 MG PO TABS: ORAL_TABLET | ORAL | 1 refills | Status: DC

## 2023-04-21 NOTE — Telephone Encounter (Signed)
Called spoke to patient   She states  that she has been taking 10 mg tablet ( whole tablet) and  half tablet ( equal 5 mg)  together.  She request a new prescription. She states she did not pick up the 20 mg prescription that was previously increase at last office visit.  Patient aware will send new prescription  to pharmacy  and labslip for recheck in 3 months

## 2023-04-26 ENCOUNTER — Other Ambulatory Visit: Payer: Self-pay | Admitting: *Deleted

## 2023-04-26 DIAGNOSIS — E785 Hyperlipidemia, unspecified: Secondary | ICD-10-CM

## 2023-04-26 DIAGNOSIS — R931 Abnormal findings on diagnostic imaging of heart and coronary circulation: Secondary | ICD-10-CM

## 2023-05-04 ENCOUNTER — Encounter: Payer: Self-pay | Admitting: Family Medicine

## 2023-05-04 ENCOUNTER — Ambulatory Visit (INDEPENDENT_AMBULATORY_CARE_PROVIDER_SITE_OTHER): Payer: Medicare Other | Admitting: Family Medicine

## 2023-05-04 DIAGNOSIS — Z0000 Encounter for general adult medical examination without abnormal findings: Secondary | ICD-10-CM | POA: Diagnosis not present

## 2023-05-04 NOTE — Progress Notes (Signed)
 Patient unable to obtain vital signs due to telehealth visit

## 2023-05-04 NOTE — Patient Instructions (Signed)
 I really enjoyed getting to talk with you today! I am available on Tuesdays and Thursdays for virtual visits if you have any questions or concerns, or if I can be of any further assistance.   CHECKLIST FROM ANNUAL WELLNESS VISIT:  -Follow up (please call to schedule if not scheduled after visit):   -yearly for annual wellness visit with primary care office  Here is a list of your preventive care/health maintenance measures and the plan for each if any are due:  PLAN For any measures below that may be due:  -can get  Health Maintenance  Topic Date Due   INFLUENZA VACCINE  05/31/2023 (Originally 10/01/2022)   Medicare Annual Wellness (AWV)  05/03/2024   MAMMOGRAM  09/07/2024   DTaP/Tdap/Td (2 - Td or Tdap) 07/18/2026   Colonoscopy  01/30/2031   Pneumonia Vaccine 78+ Years old  Completed   DEXA SCAN  Completed   COVID-19 Vaccine  Completed   Hepatitis C Screening  Completed   Zoster Vaccines- Shingrix  Completed   HPV VACCINES  Aged Out    -See a dentist at least yearly  -Get your eyes checked and then per your eye specialist's recommendations  -Other issues addressed today:   -I have included below further information regarding a healthy whole foods based diet, physical activity guidelines for adults, stress management and opportunities for social connections. I hope you find this information useful.   -----------------------------------------------------------------------------------------------------------------------------------------------------------------------------------------------------------------------------------------------------------    NUTRITION: -eat real food: lots of colorful vegetables (half the plate) and fruits -5-7 servings of vegetables and fruits per day (fresh or steamed is best), exp. 2 servings of vegetables with lunch and dinner and 2 servings of fruit per day. Berries and greens such as kale and collards are great choices.  -consume on a regular  basis:  fresh fruits, fresh veggies, fish, nuts, seeds, healthy oils (such as olive oil, avocado oil), whole grains (make sure for bread/pasta/crackers/etc., that the first ingredient on label contains the word "whole"), legumes. -can eat small amounts of dairy and lean meat (no larger than the palm of your hand), but avoid processed meats such as ham, bacon, lunch meat, etc. -drink water -try to avoid fast food and pre-packaged foods, processed meat, ultra processed foods/beverages (donuts, candy, etc.) -most experts advise limiting sodium to < 2300mg  per day, should limit further is any chronic conditions such as high blood pressure, heart disease, diabetes, etc. The American Heart Association advised that < 1500mg  is is ideal -try to avoid foods/beverages that contain any ingredients with names you do not recognize  -try to avoid foods/beverages  with added sugar or sweeteners/sweets  -try to avoid sweet drinks (including diet drinks): soda, juice, Gatorade, sweet tea, power drinks, diet drinks -try to avoid white rice, white bread, pasta (unless whole grain)  EXERCISE GUIDELINES FOR ADULTS: -if you wish to increase your physical activity, do so gradually and with the approval of your doctor -STOP and seek medical care immediately if you have any chest pain, chest discomfort or trouble breathing when starting or increasing exercise  -move and stretch your body, legs, feet and arms when sitting for long periods -Physical activity guidelines for optimal health in adults: -get at least 150 minutes per week of moderate exercise (can talk, but not sing); this is about 20-30 minutes of sustained activity 5-7 days per week or two 10-15 minute episodes of sustained activity 5-7 days per week -do some muscle building/resistance training/strength training at least 2 days per week  -balance exercises  3+ days per week:   Stand somewhere where you have something sturdy to hold onto if you lose balance    1)  lift up on toes, then back down, start with 5x per day and work up to 20x   2) stand and lift one leg straight out to the side so that foot is a few inches of the floor, start with 5x each side and work up to 20x each side   3) stand on one foot, start with 5 seconds each side and work up to 20 seconds on each side  If you need ideas or help with getting more active:  -Silver sneakers https://tools.silversneakers.com  -Walk with a Doc: http://www.duncan-williams.com/  -try to include resistance (weight lifting/strength building) and balance exercises twice per week: or the following link for ideas: http://castillo-powell.com/  BuyDucts.dk  STRESS MANAGEMENT: -can try meditating, or just sitting quietly with deep breathing while intentionally relaxing all parts of your body for 5 minutes daily -if you need further help with stress, anxiety or depression please follow up with your primary doctor or contact the wonderful folks at [facility]: [phone]  SOCIAL CONNECTIONS: -options in [place] if you wish to engage in more social and exercise related activities:  -Silver sneakers https://tools.silversneakers.com  -Walk with a Doc: http://www.duncan-williams.com/  -Check out the [facility] Active Adults 50+ section on the [place] of [employer] (hiking clubs, book clubs, cards and games, chess, exercise classes, aquatic classes and much more) - see the website for details: https://www.[place].gov/departments/parks-recreation/active-adults50  -YouTube has lots of exercise videos for different ages and abilities as well  -[facility] (a variety of indoor and outdoor inperson activities for adults). [phone]. [address]  -Virtual Online Classes (a variety of topics): see seniorplanet.org or call [phone]  -consider volunteering at a school, hospice  center, church, senior center or elsewhere    ADVANCED HEALTHCARE DIRECTIVES:  [place] Advanced Directives assistance:   ExpressWeek.com.cy  Everyone should have advanced health care directives in place. This is so that you get the care you want, should you ever be in a situation where you are unable to make your own medical decisions.   From the [place] Advanced Directive Website: "Advance Health Care Directives are legal documents in which you give written instructions about your health care if, in the future, you cannot speak for yourself.   A health care power of attorney allows you to name a person you trust to make your health care decisions if you cannot make them yourself. A declaration of a desire for a natural death (or living will) is document, which states that you desire not to have your life prolonged by extraordinary measures if you have a terminal or incurable illness or if you are in a vegetative state. An advance instruction for mental health treatment makes a declaration of instructions, information and preferences regarding your mental health treatment. It also states that you are aware that the advance instruction authorizes a mental health treatment provider to act according to your wishes. It may also outline your consent or refusal of mental health treatment. A declaration of an anatomical gift allows anyone over the age of 35 to make a gift by will, organ donor card or other document."   Please see the following website or an elder law attorney for forms, FAQs and for completion of advanced directives: [place] Health Care Directives Advance Health Care Directives (http://guzman.com/)  Or copy and paste the following to  your web browser: PoshChat.fi

## 2023-05-04 NOTE — Progress Notes (Signed)
 PATIENT CHECK-IN and HEALTH RISK ASSESSMENT QUESTIONNAIRE:  -completed by phone/video for upcoming Medicare Preventive Visit  -PLEASE SELECT "NOT IN PERSON" for the method of visit.   Pre-Visit Check-in: 1)Vitals (height, wt, BP, etc) - record in vitals section for visit on day of visit Request home vitals (wt, BP, etc.) and enter into vitals, THEN update Vital Signs SmartPhrase below at the top of the HPI. See below.  2)Review and Update Medications, Allergies PMH, Surgeries, Social history in Epic 3)Hospitalizations in the last year with date/reason? no  4)Review and Update Care Team (patient's specialists) in Epic 5) Complete PHQ9 in Epic  6) Complete Fall Screening in Epic 7)Review all Health Maintenance Due and order under PCP if not done.  Medicare Wellness Patient Questionnaire:  Answer theses question about your habits: How often do you have a drink containing alcohol? Does not drink  Have you ever smoked?n On average, how many days per week do you engage in moderate to strenuous exercise (like a brisk walk)? Last year was walking 45 minx 3-4 days per wk Are you sexually active? n Typical breakfast: Oatmeal and fruit  Typical lunch: Varies  Typical dinner: Varies  Typical snacks: Nuts and fruits   Beverages:  water - she rarely has tea or soda  Answer theses question about your everyday activities: Can you perform most household chores? Yes Are you deaf or have significant trouble hearing?No Do you feel that you have a problem with memory?No Do you feel safe at home? yes Last dentist visit? 05/04/2023 8. Do you have any difficulty performing your everyday activities?No Are you having any difficulty walking, taking medications on your own, and or difficulty managing daily home needs?No Do you have difficulty walking or climbing stairs?No Do you have difficulty dressing or bathing?No Do you have difficulty doing errands alone such as visiting a doctor's office or  shopping?No Do you currently have any difficulty preparing food and eating?No Do you currently have any difficulty using the toilet? No Do you have any difficulty managing your finances?No Do you have any difficulties with housekeeping of managing your housekeeping?No   Do you have Advanced Directives in place (Living Will, Healthcare Power or Attorney)? no   Last eye Exam and location? Dr. Santiago Bumpers   Do you currently use prescribed or non-prescribed narcotic or opioid pain medications? no  Do you have a history or close family history of breast, ovarian, tubal or peritoneal cancer or a family member with BRCA (breast cancer susceptibility 1 and 2) gene mutations? no   Nurse/Assistant Credentials/time stamp: MG 3:56 PM    ----------------------------------------------------------------------------------------------------------------------------------------------------------------------------------------------------------------------  Because this visit was a virtual/telehealth visit, some criteria may be missing or patient reported. Any vitals not documented were not able to be obtained and vitals that have been documented are patient reported.    MEDICARE ANNUAL PREVENTIVE VISIT WITH PROVIDER: (Welcome to Medicare, initial annual wellness or annual wellness exam)  Virtual Visit via Video Note  I connected with Nichole Walters on 05/04/23 by a video enabled telemedicine application and verified that I am speaking with the correct person using two identifiers.  Location patient: home Location provider:work or home office Persons participating in the virtual visit: patient, provider  Concerns and/or follow up today: no concerns, stable. She cut out all sweets over a year ago and has lots some weight.    See HM section in Epic for other details of completed HM.    ROS: negative for report of fevers, unintentional weight loss, vision  changes, vision loss, hearing  loss or change, chest pain, sob, hemoptysis, melena, hematochezia, hematuria, falls, bleeding or bruising, thoughts of suicide or self harm, memory loss  Patient-completed extensive health risk assessment - reviewed and discussed with the patient: See Health Risk Assessment completed with patient prior to the visit either above or in recent phone note. This was reviewed in detailed with the patient today and appropriate recommendations, orders and referrals were placed as needed per Summary below and patient instructions.   Review of Medical History: -PMH, PSH, Family History and current specialty and care providers reviewed and updated and listed below   Patient Care Team: [place], Betty G, MD as PCP - General (Family Medicine) Marykay Lex, MD as PCP - Cardiology (Cardiology)   Past Medical History:  Diagnosis Date   Anemia    Anxiety    no meds   Coronary artery calcification seen on CT scan 07/08/2021   Coronary Calcium Score = 212, LM 144, LAD 68.  Noncardiac findings: Aortic atherosclerosis, small pericardial effusion.   COVID-19    2022   Dyslipidemia 05/08/2013   Essential hypertension 05/08/2013   Glaucoma    Palpitations 05/08/2013   Hx    SVD (spontaneous vaginal delivery)    x 1    Past Surgical History:  Procedure Laterality Date   COLONOSCOPY     EXCISION MASS NECK     HYSTEROSCOPY N/A 07/22/2015   Procedure: HYSTEROSCOPY;  Surgeon: Ilda Mori, MD;  Location: [facility];  Service: Gynecology;  Laterality: N/A;   LEFT HEART CATH AND CORONARY ANGIOGRAPHY  04/25/2001   Dr. Clarene Duke: Minimal irregularities in the LCx.  Intramyocardial segment of the LAD with tenting in the segment that becomes epicardial no fixed lesions; EF > 60%, ? MVP without MR.-FALSE POSITIVE STRESS TEST   NM MYOVIEW LTD  02/13/2009   EF 70%  Low risk scan   NM PERSANTINE MYOVIEW LTD  01/30/2009   EF> 70%. No ischemia or infarction   ROBOTIC ASSISTED TOTAL HYSTERECTOMY WITH BILATERAL  SALPINGO OOPHERECTOMY N/A 10/22/2020   Procedure: XI ROBOTIC ASSISTED TOTAL HYSTERECTOMY WITH BILATERAL SALPINGO OOPHORECTOMY;  Surgeon: Adolphus Birchwood, MD;  Location: [facility];  Service: Gynecology;  Laterality: N/A;   TOE SURGERY     TRANSTHORACIC ECHOCARDIOGRAM  01/30/2009   Normal LV size and function. Mild MR and TR. Otherwise normal. Normal diastolic function.   WISDOM TOOTH EXTRACTION      Social History   Socioeconomic History   Marital status: Married    Spouse name: Not on file   Number of children: Not on file   Years of education: Not on file   Highest education level: 12th grade  Occupational History   Not on file  Tobacco Use   Smoking status: Never   Smokeless tobacco: Never  Vaping Use   Vaping status: Never Used  Substance and Sexual Activity   Alcohol use: No   Drug use: No   Sexual activity: Not Currently    Birth control/protection: Post-menopausal  Other Topics Concern   Not on file  Social History Narrative   Single mother of one, grandmother 3, great-grandmother 2.   Does not smoke, does not drink.   Exercises usually on treadmill mostly 3 days a week.   Social Drivers of Corporate investment banker Strain: Low Risk  (05/04/2023)   Overall Financial Resource Strain (CARDIA)    Difficulty of Paying Living Expenses: Not hard at all  Food Insecurity: No Food Insecurity (05/04/2023)  Hunger Vital Sign    Worried About Running Out of Food in the Last Year: Never true    Ran Out of Food in the Last Year: Never true  Transportation Needs: No Transportation Needs (05/04/2023)   PRAPARE - Administrator, Civil Service (Medical): No    Lack of Transportation (Non-Medical): No  Physical Activity: Sufficiently Active (05/04/2023)   Exercise Vital Sign    Days of Exercise per Week: 5 days    Minutes of Exercise per Session: 30 min  Stress: No Stress Concern Present (05/04/2023)   [employer] of Occupational Health - Occupational Stress Questionnaire     Feeling of Stress : Not at all  Social Connections: Socially Integrated (05/04/2023)   Social Connection and Isolation Panel [NHANES]    Frequency of Communication with Friends and Family: More than three times a week    Frequency of Social Gatherings with Friends and Family: More than three times a week    Attends Religious Services: More than 4 times per year    Active Member of [employer] or Organizations: Yes    Attends Engineer, structural: More than 4 times per year    Marital Status: Married  Catering manager Violence: Not At Risk (05/04/2023)   Humiliation, Afraid, Rape, and Kick questionnaire    Fear of Current or Ex-Partner: No    Emotionally Abused: No    Physically Abused: No    Sexually Abused: No    Family History  Problem Relation Age of Onset   Colon cancer Sister    Colon cancer Brother    Colon cancer Cousin     Current Outpatient Medications on File Prior to Visit  Medication Sig Dispense Refill   amLODipine (NORVASC) 5 MG tablet TAKE 1 TABLET (5 MG TOTAL) BY MOUTH DAILY. 90 tablet 2   atorvastatin (LIPITOR) 10 MG tablet Take 15 mg  to 20 mg by mouth as tolerated daily (Patient taking differently: 20 mg. Take 15 mg  to 20 mg by mouth as tolerated daily) 180 tablet 1   calcium carbonate (CALTRATE 600) 1500 (600 Ca) MG TABS tablet Take 600 mg of elemental calcium by mouth 2 (two) times daily with a meal.     latanoprost (XALATAN) 0.005 % ophthalmic solution Place 1 drop into both eyes at bedtime.     OVER THE COUNTER MEDICATION Liga-complex-take one tablet two to three times daily     valsartan-hydrochlorothiazide (DIOVAN-HCT) 160-12.5 MG tablet Take 1 tablet by mouth daily. 90 tablet 3   No current facility-administered medications on file prior to visit.    Allergies  Allergen Reactions   Citalopram Swelling and Other (See Comments)    Mouth swells       Physical Exam Vitals requested from patient and listed below if patient had equipment and was  able to obtain at home for this virtual visit: There were no vitals filed for this visit. Estimated body mass index is 25.58 kg/m as calculated from the following:   Height as of 04/02/23: 5' (1.524 m).   Weight as of 04/02/23: 131 lb (59.4 kg).  EKG (optional): deferred due to virtual visit  GENERAL: alert, oriented, no acute distress detected, full vision exam deferred due to pandemic and/or virtual encounter  HEENT: atraumatic, conjunttiva clear, no obvious abnormalities on inspection of external nose and ears  NECK: normal movements of the head and neck  LUNGS: on inspection no signs of respiratory distress, breathing rate appears normal, no obvious gross  SOB, gasping or wheezing  CV: no obvious cyanosis  MS: moves all visible extremities without noticeable abnormality  PSYCH/NEURO: pleasant and cooperative, no obvious depression or anxiety, speech and thought processing grossly intact, Cognitive function grossly intact  Flowsheet Row Clinical Support from 05/04/2023 in [facility]  PHQ-9 Total Score 0           05/04/2023    3:44 PM 01/22/2023    9:59 AM 06/03/2022    1:32 PM 03/24/2022    9:59 AM 01/19/2022    9:07 AM  Depression screen PHQ 2/9  Decreased Interest 0 0 0 0 0  Down, Depressed, Hopeless 0 0 0 0 0  PHQ - 2 Score 0 0 0 0 0  Altered sleeping 0  0    Tired, decreased energy 0  0    Change in appetite 0  0    Feeling bad or failure about yourself  0  0    Trouble concentrating 0  0    Moving slowly or fidgety/restless 0  0    Suicidal thoughts 0  0    PHQ-9 Score 0  0    Difficult doing work/chores Not difficult at all  Not difficult at all         03/20/2021    1:59 PM 10/22/2021   11:25 PM 01/19/2022    9:07 AM 03/24/2022   10:09 AM 05/04/2023    3:45 PM  Fall Risk  Falls in the past year? 1 0 0 1 1  Was there an injury with Fall? 0  0 0 0  Fall Risk Category Calculator 1  0 1 2  Fall Risk Category (Retired) Low  Low     (RETIRED) Patient Fall Risk Level   Low fall risk    Patient at Risk for Falls Due to   Other (Comment) Other (Comment) No Fall Risks  Fall risk Follow up   Falls evaluation completed Falls evaluation completed Falls evaluation completed  Tripped. Did not get hurt. Feels like theses were freak accidents as takes a balance class and feels balance is good.    SUMMARY AND PLAN:  Medicare annual wellness visit, subsequent  Discussed applicable health maintenance/preventive health measures and advised and referred or ordered per patient preferences: -had bone density test last year, osteopenic - discussed adequate vit D, calcium and bone building exercise -reports she has had reaction to flu shot so does not get - she thought it gave her the flu. We talked about vaccine reactions, risks, live vs not live vaccines, and answered questions.  Health Maintenance  Topic Date Due   INFLUENZA VACCINE  05/31/2023 (Originally 10/01/2022)   Medicare Annual Wellness (AWV)  05/03/2024   MAMMOGRAM  09/07/2024   DTaP/Tdap/Td (2 - Td or Tdap) 07/18/2026   Colonoscopy  01/30/2031   Pneumonia Vaccine 40+ Years old  Completed   DEXA SCAN  Completed   COVID-19 Vaccine  Completed   Hepatitis C Screening  Completed   Zoster Vaccines- Shingrix  Completed   HPV VACCINES  Aged [employer] and counseling on the following was provided based on the above review of health and a plan/checklist for the patient, along with additional information discussed, was provided for the patient in the patient instructions :  -Advised on importance of completing advanced directives, discussed options for completing and provided information in patient instructions as well -Provided counseling and plan for increased risk of falling if applicable per  above screening. She feels balance is good and takes a balance exercise classe.  -Advised and counseled on a healthy lifestyle - including the importance of a healthy diet, regular  physical activity -Reviewed patient's current diet. Advised and counseled on a whole foods based healthy diet. Encouraged to ensure getting adequate protein. A summary of a healthy diet was provided in the Patient Instructions.  -reviewed patient's current physical activity level and discussed exercise guidelines for adults. Discussed community resources and ideas for safe exercise at home to assist in meeting exercise guideline recommendations in a safe and healthy way. Encouraged to add strength training twice per week.  -Advise yearly dental visits at minimum and regular eye exams   Follow up: see patient instructions     Patient Instructions  I really enjoyed getting to talk with you today! I am available on Tuesdays and Thursdays for virtual visits if you have any questions or concerns, or if I can be of any further assistance.   CHECKLIST FROM ANNUAL WELLNESS VISIT:  -Follow up (please call to schedule if not scheduled after visit):   -yearly for annual wellness visit with primary care office  Here is a list of your preventive care/health maintenance measures and the plan for each if any are due:  PLAN For any measures below that may be due:  -can get  Health Maintenance  Topic Date Due   INFLUENZA VACCINE  05/31/2023 (Originally 10/01/2022)   Medicare Annual Wellness (AWV)  05/03/2024   MAMMOGRAM  09/07/2024   DTaP/Tdap/Td (2 - Td or Tdap) 07/18/2026   Colonoscopy  01/30/2031   Pneumonia Vaccine 71+ Years old  Completed   DEXA SCAN  Completed   COVID-19 Vaccine  Completed   Hepatitis C Screening  Completed   Zoster Vaccines- Shingrix  Completed   HPV VACCINES  Aged Out    -See a dentist at least yearly  -Get your eyes checked and then per your eye specialist's recommendations  -Other issues addressed today:   -I have included below further information regarding a healthy whole foods based diet, physical activity guidelines for adults, stress management and  opportunities for social connections. I hope you find this information useful.   -----------------------------------------------------------------------------------------------------------------------------------------------------------------------------------------------------------------------------------------------------------    NUTRITION: -eat real food: lots of colorful vegetables (half the plate) and fruits -5-7 servings of vegetables and fruits per day (fresh or steamed is best), exp. 2 servings of vegetables with lunch and dinner and 2 servings of fruit per day. Berries and greens such as kale and collards are great choices.  -consume on a regular basis:  fresh fruits, fresh veggies, fish, nuts, seeds, healthy oils (such as olive oil, avocado oil), whole grains (make sure for bread/pasta/crackers/etc., that the first ingredient on label contains the word "whole"), legumes. -can eat small amounts of dairy and lean meat (no larger than the palm of your hand), but avoid processed meats such as ham, bacon, lunch meat, etc. -drink water -try to avoid fast food and pre-packaged foods, processed meat, ultra processed foods/beverages (donuts, candy, etc.) -most experts advise limiting sodium to < 2300mg  per day, should limit further is any chronic conditions such as high blood pressure, heart disease, diabetes, etc. The American Heart Association advised that < 1500mg  is is ideal -try to avoid foods/beverages that contain any ingredients with names you do not recognize  -try to avoid foods/beverages  with added sugar or sweeteners/sweets  -try to avoid sweet drinks (including diet drinks): soda, juice, Gatorade, sweet tea, power drinks, diet  drinks -try to avoid white rice, white bread, pasta (unless whole grain)  EXERCISE GUIDELINES FOR ADULTS: -if you wish to increase your physical activity, do so gradually and with the approval of your doctor -STOP and seek medical care immediately if you  have any chest pain, chest discomfort or trouble breathing when starting or increasing exercise  -move and stretch your body, legs, feet and arms when sitting for long periods -Physical activity guidelines for optimal health in adults: -get at least 150 minutes per week of moderate exercise (can talk, but not sing); this is about 20-30 minutes of sustained activity 5-7 days per week or two 10-15 minute episodes of sustained activity 5-7 days per week -do some muscle building/resistance training/strength training at least 2 days per week  -balance exercises 3+ days per week:   Stand somewhere where you have something sturdy to hold onto if you lose balance    1) lift up on toes, then back down, start with 5x per day and work up to 20x   2) stand and lift one leg straight out to the side so that foot is a few inches of the floor, start with 5x each side and work up to 20x each side   3) stand on one foot, start with 5 seconds each side and work up to 20 seconds on each side  If you need ideas or help with getting more active:  -Silver sneakers https://tools.silversneakers.com  -Walk with a Doc: http://www.duncan-williams.com/  -try to include resistance (weight lifting/strength building) and balance exercises twice per week: or the following link for ideas: http://castillo-powell.com/  BuyDucts.dk  STRESS MANAGEMENT: -can try meditating, or just sitting quietly with deep breathing while intentionally relaxing all parts of your body for 5 minutes daily -if you need further help with stress, anxiety or depression please follow up with your primary doctor or contact the wonderful folks at [facility]: [phone]  SOCIAL CONNECTIONS: -options in [place] if you wish to engage in more social and exercise related activities:  -Silver sneakers https://tools.silversneakers.com  -Walk with a  Doc: http://www.duncan-williams.com/  -Check out the [facility] Active Adults 50+ section on the [place] of [employer] (hiking clubs, book clubs, cards and games, chess, exercise classes, aquatic classes and much more) - see the website for details: https://www.[place].gov/departments/parks-recreation/active-adults50  -YouTube has lots of exercise videos for different ages and abilities as well  -[facility] (a variety of indoor and outdoor inperson activities for adults). (505)665-2651. [address]  -Virtual Online Classes (a variety of topics): see seniorplanet.org or call ([phone]  -consider volunteering at a school, hospice center, church, senior center or elsewhere    ADVANCED HEALTHCARE DIRECTIVES:  [place] Advanced Directives assistance:   ExpressWeek.com.cy  Everyone should have advanced health care directives in place. This is so that you get the care you want, should you ever be in a situation where you are unable to make your own medical decisions.   From the [place] Advanced Directive Website: "Advance Health Care Directives are legal documents in which you give written instructions about your health care if, in the future, you cannot speak for yourself.   A health care power of attorney allows you to name a person you trust to make your health care decisions if you cannot make them yourself. A declaration of a desire for a natural death (or living will) is document, which states that you desire not to have your life prolonged by extraordinary measures if you have a terminal  or incurable illness or if you are in a vegetative state. An advance instruction for mental health treatment makes a declaration of instructions, information and preferences regarding your mental health treatment. It also states that you are aware that the advance instruction authorizes a mental health treatment provider to act  according to your wishes. It may also outline your consent or refusal of mental health treatment. A declaration of an anatomical gift allows anyone over the age of 45 to make a gift by will, organ donor card or other document."   Please see the following website or an elder law attorney for forms, FAQs and for completion of advanced directives: [place] TEFL teacher Health Care Directives Advance Health Care Directives (http://guzman.com/)  Or copy and paste the following to your web browser: PoshChat.fi    Terressa Koyanagi, DO

## 2023-06-19 ENCOUNTER — Other Ambulatory Visit: Payer: Self-pay | Admitting: Cardiology

## 2023-07-07 ENCOUNTER — Ambulatory Visit: Payer: Self-pay

## 2023-07-07 DIAGNOSIS — L03011 Cellulitis of right finger: Secondary | ICD-10-CM | POA: Diagnosis not present

## 2023-07-07 NOTE — Telephone Encounter (Signed)
 Noted.

## 2023-07-07 NOTE — Telephone Encounter (Signed)
 Copied from CRM [id number]. Topic: Clinical - Red Word Triage >> Jul 07, 2023  1:44 PM Dewanda Foots wrote: Red Word that prompted transfer to Nurse Triage: Pt states she has a swollen middle finger on left hand since Sunday 5/4 and was told by pharmacy it has fever and is infected.  Chief Complaint: middle finger, red and swollen, "fever in it" Symptoms: pain hot to touch, red and swollen Frequency: since Sunday Pertinent Negatives: Patient denies fever Disposition: [] ED /[x] Urgent Care (no appt availability in office) / [] Appointment(In office/virtual)/ []  [place] [facility]/ [] Home Care/ [] Refused Recommended Disposition /[] [place] [facility]/ []  Follow-up with PCP Additional Notes: instructed to go to UC; care advice given, denies questions; instructed to go to ER if becomes worse.   Reason for Disposition  [1] Looks infected (spreading redness, red streak, pus) AND [2] severe pain with movement  Answer Assessment - Initial Assessment Questions 1. ONSET: "When did the pain start?"      Sunday, red and pharmacy told her it looked infected. 2. LOCATION and RADIATION: "Where is the pain located?"  (e.g., fingertip, around nail, joint, entire  finger)      Left middle finger warm to touch 3. SEVERITY: "How bad is the pain?" "What does it keep you from doing?"   (Scale 1-10; or mild, moderate, severe)  - MILD (1-3): doesn't interfere with normal activities.   - MODERATE (4-7): interferes with normal activities or awakens from sleep.  - SEVERE (8-10): excruciating pain, unable to hold a glass of water  or bend finger even a little.     mild 4. APPEARANCE: "What does the finger look like?" (e.g., redness, swelling, bruising, pallor)     Red and swollen, hot to touch 5. WORK OR EXERCISE: "Has there been any recent work or exercise that involved this part (i.e., fingers or hand) of the body?"     denies 6. CAUSE: "What do you think is causing the pain?"     infection 7. AGGRAVATING  FACTORS: "What makes the pain worse?" (e.g., using computer)     no 8. OTHER SYMPTOMS: "Do you have any other symptoms?" (e.g., fever, neck pain, numbness)     no 9. PREGNANCY: "Is there any chance you are pregnant?" "When was your last menstrual period?"     na  Protocols used: Finger Pain-A-AH

## 2023-07-23 ENCOUNTER — Encounter: Payer: Self-pay | Admitting: Family Medicine

## 2023-07-23 ENCOUNTER — Ambulatory Visit (INDEPENDENT_AMBULATORY_CARE_PROVIDER_SITE_OTHER): Admitting: Family Medicine

## 2023-07-23 VITALS — BP 110/68 | HR 77 | Temp 98.7°F | Ht 60.0 in | Wt 129.2 lb

## 2023-07-23 DIAGNOSIS — M546 Pain in thoracic spine: Secondary | ICD-10-CM | POA: Diagnosis not present

## 2023-07-23 DIAGNOSIS — M62830 Muscle spasm of back: Secondary | ICD-10-CM

## 2023-07-23 MED ORDER — CYCLOBENZAPRINE HCL 5 MG PO TABS: 5.0000 mg | ORAL_TABLET | Freq: Every evening | ORAL | 0 refills | Status: DC | PRN

## 2023-07-23 NOTE — Patient Instructions (Addendum)
 Prescription for Flexeril sent to your pharmacy.  Flexeril, a muscle relaxer, can cause drowsiness.  Use with caution.  If medication makes you feel too sleepy you can always try taking half a tab at night to see how it makes you feel.  Muscle strain can take 4 to 6 weeks to completely resolve.

## 2023-07-23 NOTE — Progress Notes (Signed)
 Established Patient Office Visit   Subjective  Patient ID: Nichole Walters, female    DOB: 1951-02-12  Age: 73 y.o. MRN: [id number]  Chief Complaint  Patient presents with   Medical Management of Chronic Issues    Back pain     Patient is a 73 year old female followed by Dr. [place] and seen for acute concern.  Patient endorses right-sided mid back pain x 1 day.  Pain started after twisting to the side while moving an item.  Pain felt with deep breathing.  Noted as 3/10 pain/muscle spasm.  Pain may have been more intense when happened as had 1 episode of emesis.  Notes this happened once before with back pain.  Sensation has improved this morning.  Patient took Tylenol  for symptoms which helped some.    Patient Active Problem List   Diagnosis Date Noted   Prediabetes 01/22/2023   Steatosis of liver 06/03/2022   Routine general medical examination at a health care facility 01/23/2022   Enlarged thyroid  gland 01/19/2022   Agatston CAC score 200-399 08/04/2021   Thoracic aortic atherosclerosis (HCC) 08/04/2021   Lymph node enlargement 08/04/2021   Slow heart rate 06/02/2021   PAD (peripheral artery disease) (HCC) 01/01/2021   Simple endometrial hyperplasia without atypia 06/16/2019   Bilateral ovarian cysts 08/05/2018   Radiculopathy, lumbar region 05/08/2018   Back pain 01/14/2018   Elevated transaminase measurement 06/28/2017   Vitamin D  deficiency, unspecified 02/22/2017   Essential hypertension 05/08/2013   Hyperlipidemia with target LDL less than 70 05/08/2013   Palpitations 05/08/2013   Past Medical History:  Diagnosis Date   Anemia    Anxiety    no meds   Coronary artery calcification seen on CT scan 07/08/2021   Coronary Calcium  Score = 212, LM 144, LAD 68.  Noncardiac findings: Aortic atherosclerosis, small pericardial effusion.   COVID-19    2022   Dyslipidemia 05/08/2013   Essential hypertension 05/08/2013   Glaucoma    Palpitations 05/08/2013   Hx     SVD (spontaneous vaginal delivery)    x 1   Past Surgical History:  Procedure Laterality Date   COLONOSCOPY     EXCISION MASS NECK     HYSTEROSCOPY N/A 07/22/2015   Procedure: HYSTEROSCOPY;  Surgeon: Astrid Blamer, MD;  Location: [facility];  Service: Gynecology;  Laterality: N/A;   LEFT HEART CATH AND CORONARY ANGIOGRAPHY  04/25/2001   Dr. Nathen Balder: Minimal irregularities in the LCx.  Intramyocardial segment of the LAD with tenting in the segment that becomes epicardial no fixed lesions; EF > 60%, ? MVP without MR.-FALSE POSITIVE STRESS TEST   NM MYOVIEW  LTD  02/13/2009   EF 70%  Low risk scan   NM PERSANTINE MYOVIEW  LTD  01/30/2009   EF> 70%. No ischemia or infarction   ROBOTIC ASSISTED TOTAL HYSTERECTOMY WITH BILATERAL SALPINGO OOPHERECTOMY N/A 10/22/2020   Procedure: XI ROBOTIC ASSISTED TOTAL HYSTERECTOMY WITH BILATERAL SALPINGO OOPHORECTOMY;  Surgeon: Alphonso Aschoff, MD;  Location: [facility];  Service: Gynecology;  Laterality: N/A;   TOE SURGERY     TRANSTHORACIC ECHOCARDIOGRAM  01/30/2009   Normal LV size and function. Mild MR and TR. Otherwise normal. Normal diastolic function.   WISDOM TOOTH EXTRACTION     Social History   Tobacco Use   Smoking status: Never   Smokeless tobacco: Never  Vaping Use   Vaping status: Never Used  Substance Use Topics   Alcohol use: No   Drug use: No   Family History  Problem Relation  Age of Onset   Colon cancer Sister    Colon cancer Brother    Colon cancer Cousin    Allergies  Allergen Reactions   Citalopram Swelling and Other (See Comments)    Mouth swells    ROS Negative unless stated above    Objective:      BP 110/68 (BP Location: Left Arm, Patient Position: Sitting, Cuff Size: Normal)   Pulse 77   Temp 98.7 F (37.1 C) (Oral)   Ht 5' (1.524 m)   Wt 129 lb 3.2 oz (58.6 kg)   SpO2 96%   BMI 25.23 kg/m  BP Readings from Last 3 Encounters:  07/23/23 110/68  04/02/23 122/62  01/22/23 122/70   Wt Readings from Last 3  Encounters:  07/23/23 129 lb 3.2 oz (58.6 kg)  04/02/23 131 lb (59.4 kg)  01/22/23 131 lb 4 oz (59.5 kg)      Physical Exam Constitutional:      General: She is not in acute distress.    Appearance: Normal appearance.  HENT:     Head: Normocephalic and atraumatic.     Nose: Nose normal.     Mouth/Throat:     Mouth: Mucous membranes are moist.  Cardiovascular:     Rate and Rhythm: Normal rate and regular rhythm.     Heart sounds: Normal heart sounds. No murmur heard.    No gallop.  Pulmonary:     Effort: Pulmonary effort is normal. No respiratory distress.     Breath sounds: Normal breath sounds. No wheezing, rhonchi or rales.  Musculoskeletal:     Cervical back: Normal.     Thoracic back: Normal.     Lumbar back: Normal.       Back:  Skin:    General: Skin is warm and dry.  Neurological:     Mental Status: She is alert and oriented to person, place, and time.     No results found for any visits on 07/23/23.    Assessment & Plan:   Acute right-sided thoracic back pain -     Cyclobenzaprine HCl; Take 1 tablet (5 mg total) by mouth at bedtime as needed for muscle spasms.  Dispense: 10 tablet; Refill: 0  Muscle spasm of back -     Cyclobenzaprine HCl; Take 1 tablet (5 mg total) by mouth at bedtime as needed for muscle spasms.  Dispense: 10 tablet; Refill: 0   Patient with acute right sided thoracic back pain and muscle spasm likely 2/2 muscle strain due to stretching.  Supportive care including heat, ice, topical analgesics, Tylenol  or NSAIDs as needed, massage.  Given Rx for muscle relaxer just in case symptoms worsen over the weekend.  Given strict precautions.  Return if symptoms worsen or fail to improve.   Nichole Greulich, MD

## 2023-08-08 ENCOUNTER — Other Ambulatory Visit: Payer: Self-pay | Admitting: Family Medicine

## 2023-08-31 DIAGNOSIS — H401132 Primary open-angle glaucoma, bilateral, moderate stage: Secondary | ICD-10-CM | POA: Diagnosis not present

## 2023-08-31 DIAGNOSIS — H2513 Age-related nuclear cataract, bilateral: Secondary | ICD-10-CM | POA: Diagnosis not present

## 2023-09-13 DIAGNOSIS — Z1231 Encounter for screening mammogram for malignant neoplasm of breast: Secondary | ICD-10-CM | POA: Diagnosis not present

## 2023-10-28 ENCOUNTER — Other Ambulatory Visit: Payer: Self-pay | Admitting: Cardiology

## 2023-11-10 ENCOUNTER — Ambulatory Visit (INDEPENDENT_AMBULATORY_CARE_PROVIDER_SITE_OTHER): Admitting: Dermatology

## 2023-11-10 ENCOUNTER — Encounter: Payer: Self-pay | Admitting: Dermatology

## 2023-11-10 ENCOUNTER — Telehealth: Payer: Self-pay | Admitting: *Deleted

## 2023-11-10 VITALS — BP 112/70

## 2023-11-10 DIAGNOSIS — L299 Pruritus, unspecified: Secondary | ICD-10-CM

## 2023-11-10 DIAGNOSIS — L853 Xerosis cutis: Secondary | ICD-10-CM | POA: Diagnosis not present

## 2023-11-10 NOTE — Progress Notes (Signed)
   New Patient Visit   Subjective  Nichole Walters is a 73 y.o. female who presents for the following: She has always had very dry skin, mostly of her legs. It got worse after she turned 72. She has used Eucerin Extra Dry skin lotion and Cerave cream and neither really worked. About a week ago, she mixed the Eucerin with the vaseline and it feels smoother   The patient has spots, moles and lesions to be evaluated, some may be new or changing and the patient may have concern these could be cancer.   The following portions of the chart were reviewed this encounter and updated as appropriate: medications, allergies, medical history  Review of Systems:  No other skin or systemic complaints except as noted in HPI or Assessment and Plan.  Objective  Well appearing patient in no apparent distress; mood and affect are within normal limits.   A focused examination was performed of the following areas: legs   Relevant exam findings are noted in the Assessment and Plan.    Assessment & Plan    1. Xerosis Cutis and Pruritus - Assessment: Patient reports dry, rough skin that has improved slightly with the use of Uceris and Vaseline. The condition is likely age-related, as decreased skin cell turnover rate with maturity can impede moisture retention. The patient's self-treatment approach aligns with recommended management strategies for xerosis cutis.  - Plan:    Add moisturizer containing exfoliating agents (salicylic acid or urea) to skincare routine    Implement gentle exfoliation in shower using loofah or buff puff    Apply moisturizer immediately after showering    Use Eucerin Advanced Repair or La Roche-Posay Lipkar Triple Repair w/ urea    Continue using Vaseline for moisture retention    Consider Aquaphor spray for convenient application and moisture lock    Switch to Dove or other recommended body wash    Use lukewarm water  for showering to preserve natural skin oils     Provided samples and coupon book for recommended products  Follow up if any skin irritations or issues arise.    Return if symptoms worsen or fail to improve.  I, Roseline Hutchinson, CMA, am acting as scribe for [employer], DO .   Documentation: I have reviewed the above documentation for accuracy and completeness, and I agree with the above.  Delon Lenis, DO

## 2023-11-10 NOTE — Telephone Encounter (Signed)
 I do not see order or report but she can call office of provider who ordered MRI to inquire about results.   Thanks, BJ

## 2023-11-10 NOTE — Telephone Encounter (Signed)
 Copied from CRM [id number]. Topic: Clinical - Lab/Test Results >> Nov 10, 2023  1:50 PM Macario HERO wrote: Reason for CRM: Patient called said she went to [facility] for an MRI regarding her memory and have not heard anything back.

## 2023-11-10 NOTE — Patient Instructions (Addendum)
 Date: Wed Nov 10 2023  Hello Kit,  Thank you for visiting today. Here is a summary of the key instructions:  - Skin Care:   - Use a moisturizer with exfoliating agent with urea   - Gently exfoliate in the shower with a loofah or buff puff   - Apply moisturizer (Eucerin Advanced Repair or La Roche Posey Lipkar Triple Moisture Repair with Urea) after showering   - Use Dove or Eucerin Advanced Repair body wash in the shower   - Shower with lukewarm water    - Use Aquaphor spray for convenient moisture locking  - Other Instructions:   - Contact the office if any skin irritations or issues occur   - Products available at [facility] or [employer]  Please reach out if you have any questions or concerns.  Warm regards,  Dr. Delon Lenis [facility]     Important Information   Due to recent changes in healthcare laws, you may see results of your pathology and/or laboratory studies on MyChart before the doctors have had a chance to review them. We understand that in some cases there may be results that are confusing or concerning to you. Please understand that not all results are received at the same time and often the doctors may need to interpret multiple results in order to provide you with the best plan of care or course of treatment. Therefore, we ask that you please give us  2 business days to thoroughly review all your results before contacting the office for clarification. Should we see a critical lab result, you will be contacted sooner.     If You Need Anything After Your Visit   If you have any questions or concerns for your doctor, please call our main line at [phone]. If no one answers, please leave a voicemail as directed and we will return your call as soon as possible. Messages left after 4 pm will be answered the following business day.    You may also send us  a message via MyChart. We typically respond to MyChart messages within 1-2 business days.  For prescription  refills, please ask your pharmacy to contact our office. Our fax number is [phone].  If you have an urgent issue when the clinic is closed that cannot wait until the next business day, you can page your doctor at the number below.     Please note that while we do our best to be available for urgent issues outside of office hours, we are not available 24/7.    If you have an urgent issue and are unable to reach us , you may choose to seek medical care at your doctor's office, retail clinic, urgent care center, or emergency room.   If you have a medical emergency, please immediately call 911 or go to the emergency department. In the event of inclement weather, please call our main line at [phone] for an update on the status of any delays or closures.  Dermatology Medication Tips: Please keep the boxes that topical medications come in in order to help keep track of the instructions about where and how to use these. Pharmacies typically print the medication instructions only on the boxes and not directly on the medication tubes.   If your medication is too expensive, please contact our office at [phone] or send us  a message through MyChart.    We are unable to tell what your co-pay for medications will be in advance as this is different depending on your insurance coverage.  However, we may be able to find a substitute medication at lower cost or fill out paperwork to get insurance to cover a needed medication.    If a prior authorization is required to get your medication covered by your insurance company, please allow us  1-2 business days to complete this process.   Drug prices often vary depending on where the prescription is filled and some pharmacies may offer cheaper prices.   The website www.goodrx.com contains coupons for medications through different pharmacies. The prices here do not account for what the cost may be with help from insurance (it may be cheaper with your  insurance), but the website can give you the price if you did not use any insurance.  - You can print the associated coupon and take it with your prescription to the pharmacy.  - You may also stop by our office during regular business hours and pick up a GoodRx coupon card.  - If you need your prescription sent electronically to a different pharmacy, notify our office through [facility] or by phone at [phone]

## 2023-11-11 NOTE — Telephone Encounter (Signed)
 Patient is aware.

## 2023-11-17 ENCOUNTER — Telehealth: Payer: Self-pay

## 2023-11-17 NOTE — Telephone Encounter (Signed)
 Copied from CRM [id number]. Topic: Clinical - Medication Question >> Nov 17, 2023  2:55 PM Nichole Walters wrote: Reason for CRM: pt would like a rx sent to her pharmacy for the covid vaccine. Pharmacy is [facility] on [address] Please confirm with pt and call.

## 2023-11-18 NOTE — Telephone Encounter (Signed)
 Rx can be sent. Thanks, BJ

## 2023-11-19 MED ORDER — COVID-19 MRNA VACC (MODERNA) 50 MCG/0.5ML IM SUSY: 0.5000 mL | PREFILLED_SYRINGE | Freq: Once | INTRAMUSCULAR | 0 refills | Status: AC

## 2023-11-19 MED ORDER — COVID-19 MRNA VACC (MODERNA) 50 MCG/0.5ML IM SUSY: 0.5000 mL | PREFILLED_SYRINGE | Freq: Once | INTRAMUSCULAR | 0 refills | Status: DC

## 2023-11-19 NOTE — Telephone Encounter (Signed)
 Rx done.

## 2023-11-19 NOTE — Addendum Note (Signed)
 Addended by: CHRISTYNE IDELL LABOR on: 11/19/2023 11:25 AM   Modules accepted: Orders

## 2024-01-24 ENCOUNTER — Ambulatory Visit: Payer: Self-pay | Admitting: Family Medicine

## 2024-01-24 ENCOUNTER — Encounter: Payer: Self-pay | Admitting: Family Medicine

## 2024-01-24 ENCOUNTER — Ambulatory Visit: Admitting: Family Medicine

## 2024-01-24 VITALS — BP 120/68 | HR 67 | Temp 98.5°F | Resp 16 | Ht 60.0 in | Wt 143.0 lb

## 2024-01-24 DIAGNOSIS — E785 Hyperlipidemia, unspecified: Secondary | ICD-10-CM

## 2024-01-24 DIAGNOSIS — R7303 Prediabetes: Secondary | ICD-10-CM

## 2024-01-24 DIAGNOSIS — Z0000 Encounter for general adult medical examination without abnormal findings: Secondary | ICD-10-CM

## 2024-01-24 LAB — COMPREHENSIVE METABOLIC PANEL WITH GFR
ALT: 13 U/L (ref 0–35)
AST: 20 U/L (ref 0–37)
Albumin: 4.3 g/dL (ref 3.5–5.2)
Alkaline Phosphatase: 55 U/L (ref 39–117)
BUN: 11 mg/dL (ref 6–23)
CO2: 31 meq/L (ref 19–32)
Calcium: 9.4 mg/dL (ref 8.4–10.5)
Chloride: 104 meq/L (ref 96–112)
Creatinine, Ser: 0.61 mg/dL (ref 0.40–1.20)
GFR: 89.97 mL/min (ref >60.00)
Glucose, Bld: 78 mg/dL (ref 70–99)
Potassium: 4 meq/L (ref 3.5–5.1)
Sodium: 139 meq/L (ref 135–145)
Total Bilirubin: 0.5 mg/dL (ref 0.2–1.2)
Total Protein: 8 g/dL (ref 6.0–8.3)

## 2024-01-24 LAB — HEMOGLOBIN A1C: Hgb A1c MFr Bld: 5.9 % (ref 4.6–6.5)

## 2024-01-24 LAB — LIPID PANEL
Cholesterol: 145 mg/dL (ref 0–200)
HDL: 52.5 mg/dL (ref >39.00)
LDL Cholesterol: 82 mg/dL (ref 0–99)
NonHDL: 92.86
Total CHOL/HDL Ratio: 3
Triglycerides: 54 mg/dL (ref 0.0–149.0)
VLDL: 10.8 mg/dL (ref 0.0–40.0)

## 2024-01-24 NOTE — Patient Instructions (Addendum)
 A few things to remember from today's visit:  Routine general medical examination at a health care facility  Prediabetes - Plan: Hemoglobin A1c  Hyperlipidemia with target LDL less than 70 - Plan: Comprehensive metabolic panel with GFR, Lipid panel  If you need refills for medications you take chronically, please call your pharmacy. Do not use My Chart to request refills or for acute issues that need immediate attention. If you send a my chart message, it may take a few days to be addressed, specially if I am not in the office.  Please be sure medication list is accurate. If a new problem present, please set up appointment sooner than planned today.

## 2024-01-24 NOTE — Assessment & Plan Note (Signed)
 Last LDL 91 in 01/2023. Continue atorvastatin  10 mg 1.5 tab daily and low-fat diet. Follows with cardiologist regularly.

## 2024-01-24 NOTE — Assessment & Plan Note (Signed)
 We discussed the importance of regular physical activity and healthy diet for prevention of chronic illness and/or complications. Preventive guidelines reviewed. Vaccination up to date, declined flu vaccine. Ca++ and vit D supplementation to continue. Follows with gynecologist regularly. Fall precautions discussed. Next CPE in a year.

## 2024-01-24 NOTE — Progress Notes (Signed)
 Chief Complaint  Patient presents with   Annual Exam    Discussed the use of AI scribe software for clinical note transcription with the patient, who gave verbal consent to proceed.  History of Present Illness Nichole Walters is a 73 year old female who is with PMHx significant for HLD, HTN, aortic atherosclerosis, PAD, vit D deficiency, and chronic back pain here today for her routine physical.  Last CPE: 01/22/23 She sees gynecologist annually, last seen in 08/2023. No new problems since her last visit.  She has decreased her exercise routine, now walking only once a week for about a mile, compared to her previous regimen. She has gained weight, currently weighing 143 pounds, up from 131 pounds last year, which she attributes to resuming the consumption of sweets since April 2025. Her diet consists of home-cooked meals with daily vegetable intake. She does not consume alcohol and does not smoke.  She sleeps 5 to 6 hours per night. She sees her eye care provider and dentist regularly.  She has experienced three falls in the past year, one of which occurred while descending stairs, resulting in a knee injury. She describes herself as 'somewhat clumsy.'  She has a history of osteopenia, with her last bone density scan conducted in 04/2022. She takes calcium  and vit D supplementation.  Immunization History  Administered Date(s) Administered   Hep A / Hep B 03/09/2017, 04/12/2017, 09/06/2017   PFIZER Comirnaty(Gray Top)Covid-19 Tri-Sucrose Vaccine 04/24/2019, 05/15/2019, 12/04/2019   PFIZER(Purple Top)SARS-COV-2 Vaccination 04/24/2019, 05/15/2019, 12/04/2019   Pfizer Covid-19 Vaccine Bivalent Booster 79yrs & up 02/26/2021   Pfizer(Comirnaty)Fall Seasonal Vaccine 12 years and older 03/04/2022, 11/17/2022   Pneumococcal Conjugate-13 09/06/2017   Pneumococcal Polysaccharide-23 12/28/2018   Tdap 07/17/2016   Zoster Recombinant(Shingrix) 04/01/2021, 07/19/2021   Health  Maintenance  Topic Date Due   COVID-19 Vaccine (10 - 2025-26 season) 02/09/2024 (Originally 11/01/2023)   Influenza Vaccine  05/30/2024 (Originally 10/01/2023)   Medicare Annual Wellness (AWV)  05/03/2024   Mammogram  09/07/2024   DTaP/Tdap/Td (2 - Td or Tdap) 07/18/2026   Colonoscopy  01/30/2031   Pneumococcal Vaccine: 50+ Years  Completed   Bone Density Scan  Completed   Hepatitis C Screening  Completed   Zoster Vaccines- Shingrix  Completed   Meningococcal B Vaccine  Aged Out   Hepatitis B Vaccines 19-59 Average Risk  Discontinued   Hyperlipidemia: +PAD and elevated coronary artery calcium  score. Follows with cardiologist. Currently on atorvastatin  10 mg 1.5 tab daily, higher doses caused drowsiness.  Lab Results  Component Value Date   CHOL 148 01/22/2023   HDL 46.00 01/22/2023   LDLCALC 91 01/22/2023   TRIG 58.0 01/22/2023   CHOLHDL 3 01/22/2023   Hypertension: Amlodipine  5 mg and valsartan -hydrochlorothiazide  160-12.5 mg daily. Follows with cardiologist.  Lab Results  Component Value Date   NA 139 01/22/2023   CL 102 01/22/2023   K 3.7 01/22/2023   CO2 31 01/22/2023   BUN 15 01/22/2023   CREATININE 0.71 01/22/2023   GFR 86.17 01/22/2023   CALCIUM  10.2 01/22/2023   ALBUMIN 4.3 01/22/2023   GLUCOSE 82 01/22/2023   HgA1C has been mildly elevated, no hx of diabetes.  Lab Results  Component Value Date   HGBA1C 6.0 01/22/2023   Review of Systems  Constitutional:  Negative for activity change, appetite change and fever.  HENT:  Negative for mouth sores, sore throat and trouble swallowing.   Eyes:  Negative for redness and visual disturbance.  Respiratory:  Negative  for cough, shortness of breath and wheezing.   Cardiovascular:  Positive for palpitations (occasionally, chronic). Negative for chest pain and leg swelling.  Gastrointestinal:  Negative for abdominal pain, nausea and vomiting.  Endocrine: Negative for cold intolerance, heat intolerance, polydipsia,  polyphagia and polyuria.  Genitourinary:  Negative for decreased urine volume, dysuria and hematuria.  Musculoskeletal:  Positive for arthralgias. Negative for gait problem.  Skin:  Negative for color change and rash.  Allergic/Immunologic: Negative for environmental allergies.  Neurological:  Negative for syncope, weakness and headaches.  Hematological:  Negative for adenopathy. Does not bruise/bleed easily.  Psychiatric/Behavioral:  Negative for confusion and hallucinations.   All other systems reviewed and are negative.  Current Outpatient Medications on File Prior to Visit  Medication Sig Dispense Refill   amLODipine  (NORVASC ) 5 MG tablet TAKE 1 TABLET (5 MG TOTAL) BY MOUTH DAILY. 90 tablet 2   atorvastatin  (LIPITOR) 10 MG tablet TAKE 15 MG TO 20 MG BY MOUTH AS TOLERATED DAILY 180 tablet 1   calcium  carbonate (CALTRATE 600) 1500 (600 Ca) MG TABS tablet Take 600 mg of elemental calcium  by mouth 2 (two) times daily with a meal.     latanoprost (XALATAN) 0.005 % ophthalmic solution Place 1 drop into both eyes at bedtime.     OVER THE COUNTER MEDICATION Liga-complex-take one tablet two to three times daily     valsartan -hydrochlorothiazide  (DIOVAN -HCT) 160-12.5 MG tablet TAKE 1 TABLET BY MOUTH EVERY DAY 90 tablet 3   No current facility-administered medications on file prior to visit.   Past Medical History:  Diagnosis Date   Anemia    Anxiety    no meds   Coronary artery calcification seen on CT scan 07/08/2021   Coronary Calcium  Score = 212, LM 144, LAD 68.  Noncardiac findings: Aortic atherosclerosis, small pericardial effusion.   COVID-19    2022   Dyslipidemia 05/08/2013   Essential hypertension 05/08/2013   Glaucoma    Palpitations 05/08/2013   Hx    SVD (spontaneous vaginal delivery)    x 1    Past Surgical History:  Procedure Laterality Date   COLONOSCOPY     EXCISION MASS NECK     HYSTEROSCOPY N/A 07/22/2015   Procedure: HYSTEROSCOPY;  Surgeon: Charlie Aho, MD;   Location: [facility];  Service: Gynecology;  Laterality: N/A;   LEFT HEART CATH AND CORONARY ANGIOGRAPHY  04/25/2001   Dr. Morgan: Minimal irregularities in the LCx.  Intramyocardial segment of the LAD with tenting in the segment that becomes epicardial no fixed lesions; EF > 60%, ? MVP without MR.-FALSE POSITIVE STRESS TEST   NM MYOVIEW  LTD  02/13/2009   EF 70%  Low risk scan   NM PERSANTINE MYOVIEW  LTD  01/30/2009   EF> 70%. No ischemia or infarction   ROBOTIC ASSISTED TOTAL HYSTERECTOMY WITH BILATERAL SALPINGO OOPHERECTOMY N/A 10/22/2020   Procedure: XI ROBOTIC ASSISTED TOTAL HYSTERECTOMY WITH BILATERAL SALPINGO OOPHORECTOMY;  Surgeon: Eloy Herring, MD;  Location: [facility];  Service: Gynecology;  Laterality: N/A;   TOE SURGERY     TRANSTHORACIC ECHOCARDIOGRAM  01/30/2009   Normal LV size and function. Mild MR and TR. Otherwise normal. Normal diastolic function.   WISDOM TOOTH EXTRACTION      Allergies  Allergen Reactions   Citalopram Swelling and Other (See Comments)    Mouth swells    Family History  Problem Relation Age of Onset   Colon cancer Sister    Colon cancer Brother    Colon cancer Cousin  Social History   Socioeconomic History   Marital status: Married    Spouse name: Not on file   Number of children: Not on file   Years of education: Not on file   Highest education level: 12th grade  Occupational History   Not on file  Tobacco Use   Smoking status: Never   Smokeless tobacco: Never  Vaping Use   Vaping status: Never Used  Substance and Sexual Activity   Alcohol use: No   Drug use: No   Sexual activity: Not Currently    Birth control/protection: Post-menopausal  Other Topics Concern   Not on file  Social History Narrative   Single mother of one, grandmother 3, great-grandmother 2.   Does not smoke, does not drink.   Exercises usually on treadmill mostly 3 days a week.   Social Drivers of Corporate Investment Banker Strain: Low Risk  (05/04/2023)    Overall Financial Resource Strain (CARDIA)    Difficulty of Paying Living Expenses: Not hard at all  Food Insecurity: No Food Insecurity (05/04/2023)   Hunger Vital Sign    Worried About Running Out of Food in the Last Year: Never true    Ran Out of Food in the Last Year: Never true  Transportation Needs: No Transportation Needs (05/04/2023)   PRAPARE - Administrator, Civil Service (Medical): No    Lack of Transportation (Non-Medical): No  Physical Activity: Sufficiently Active (05/04/2023)   Exercise Vital Sign    Days of Exercise per Week: 5 days    Minutes of Exercise per Session: 30 min  Stress: No Stress Concern Present (05/04/2023)   [employer] of Occupational Health - Occupational Stress Questionnaire    Feeling of Stress : Not at all  Social Connections: Socially Integrated (05/04/2023)   Social Connection and Isolation Panel    Frequency of Communication with Friends and Family: More than three times a week    Frequency of Social Gatherings with Friends and Family: More than three times a week    Attends Religious Services: More than 4 times per year    Active Member of [employer] or Organizations: Yes    Attends Engineer, Structural: More than 4 times per year    Marital Status: Married   Today's Vitals   01/24/24 1332  BP: 120/68  Pulse: 67  Resp: 16  Temp: 98.5 F (36.9 C)  TempSrc: Oral  SpO2: 97%  Weight: 143 lb (64.9 kg)  Height: 5' (1.524 m)   Body mass index is 27.93 kg/m.  Wt Readings from Last 3 Encounters:  01/24/24 143 lb (64.9 kg)  07/23/23 129 lb 3.2 oz (58.6 kg)  04/02/23 131 lb (59.4 kg)   Physical Exam Vitals and nursing note reviewed.  Constitutional:      General: She is not in acute distress.    Appearance: She is well-developed.  HENT:     Head: Normocephalic and atraumatic.     Right Ear: Tympanic membrane, ear canal and external ear normal.     Left Ear: Tympanic membrane, ear canal and external ear normal.      Mouth/Throat:     Mouth: Mucous membranes are moist.     Pharynx: Oropharynx is clear. Uvula midline.  Eyes:     Conjunctiva/sclera: Conjunctivae normal.     Pupils: Pupils are equal, round, and reactive to light.  Neck:     Thyroid : No thyroid  mass or thyromegaly (palpable.).  Cardiovascular:  Rate and Rhythm: Normal rate and regular rhythm.     Pulses:          Dorsalis pedis pulses are 2+ on the right side and 2+ on the left side.     Heart sounds: No murmur heard. Pulmonary:     Effort: Pulmonary effort is normal. No respiratory distress.     Breath sounds: Normal breath sounds.  Abdominal:     Palpations: Abdomen is soft. There is no hepatomegaly or mass.     Tenderness: There is no abdominal tenderness.  Genitourinary:    Comments: No concerns today. Musculoskeletal:     Comments: No signs of synovitis appreciated.  Lymphadenopathy:     Cervical: No cervical adenopathy.  Skin:    General: Skin is warm.     Findings: No erythema or rash.  Neurological:     General: No focal deficit present.     Mental Status: She is alert and oriented to person, place, and time.     Cranial Nerves: No cranial nerve deficit.     Gait: Gait normal.     Deep Tendon Reflexes:     Reflex Scores:      Bicep reflexes are 2+ on the right side and 2+ on the left side.      Patellar reflexes are 2+ on the right side and 2+ on the left side. Psychiatric:        Mood and Affect: Mood and affect normal.   ASSESSMENT AND PLAN: Ms. Evelyn Aguinaldo was here today annual physical examination.  Orders Placed This Encounter  Procedures   Comprehensive metabolic panel with GFR   Lipid panel   Hemoglobin A1c   Lab Results  Component Value Date   HGBA1C 5.9 01/24/2024   Lab Results  Component Value Date   NA 139 01/24/2024   CL 104 01/24/2024   K 4.0 01/24/2024   CO2 31 01/24/2024   BUN 11 01/24/2024   CREATININE 0.61 01/24/2024   GFR 89.97 01/24/2024   CALCIUM  9.4  01/24/2024   ALBUMIN 4.3 01/24/2024   GLUCOSE 78 01/24/2024   Lab Results  Component Value Date   ALT 13 01/24/2024   AST 20 01/24/2024   ALKPHOS 55 01/24/2024   BILITOT 0.5 01/24/2024   Lab Results  Component Value Date   CHOL 145 01/24/2024   HDL 52.50 01/24/2024   LDLCALC 82 01/24/2024   TRIG 54.0 01/24/2024   CHOLHDL 3 01/24/2024   Routine general medical examination at a health care facility Assessment & Plan: We discussed the importance of regular physical activity and healthy diet for prevention of chronic illness and/or complications. Preventive guidelines reviewed. Vaccination up to date, declined flu vaccine. Ca++ and vit D supplementation to continue. Follows with gynecologist regularly. Fall precautions discussed. Next CPE in a year.   Prediabetes Assessment & Plan: HgA1C was 6.0 in 01/2023. Encouraged consistency with a healthy life style for diabetes prevention.  Orders: -     Hemoglobin A1c; Future  Hyperlipidemia with target LDL less than 70 Assessment & Plan: Last LDL 91 in 01/2023. Continue atorvastatin  10 mg 1.5 tab daily and low-fat diet. Follows with cardiologist regularly.  Orders: -     Comprehensive metabolic panel with GFR; Future -     Lipid panel; Future   Return in 1 year (on 01/23/2025) for CPE.  Andreanna Mikolajczak G. Vedha Tercero, MD  [facility]. [place] office.

## 2024-01-24 NOTE — Assessment & Plan Note (Signed)
 HgA1C was 6.0 in 01/2023. Encouraged consistency with a healthy life style for diabetes prevention.

## 2024-02-16 ENCOUNTER — Telehealth: Payer: Self-pay | Admitting: Cardiology

## 2024-02-16 NOTE — Telephone Encounter (Signed)
 Call patient. Appointment schedule for 02/16/24 at 1:30 pm  Patient verbalized understanding- directions given.

## 2024-02-16 NOTE — Telephone Encounter (Signed)
°  Pt c/o of Chest Pain: STAT if active CP, including tightness, pressure, jaw pain, radiating pain to shoulder/upper arm/back, CP unrelieved by Nitro. Symptoms reported of SOB, nausea, vomiting, sweating.  1. Are you having CP right now?   No  2. Are you experiencing any other symptoms (ex. SOB, nausea, vomiting, sweating)?   No  3. Is your CP continuous or coming and going?   Coming and going  4. Have you taken Nitroglycerin?  No  5. How long have you been experiencing CP?   Ongoing for a couple of months   6. If NO CP at time of call then end call with telling Pt to call back or call 911 if Chest pain returns prior to return call from triage team.   Patient is concerned she has been having chest pressure.

## 2024-02-16 NOTE — Telephone Encounter (Signed)
 Left message for patient to call back

## 2024-02-16 NOTE — Telephone Encounter (Signed)
 Pt returning call

## 2024-02-16 NOTE — Telephone Encounter (Signed)
 Pt c/o chest feeling funny for the last few months. Described as something flowing, then has a funny feeling like it's not flowing, and then will smooth out. Pt states she always has odd things going on with her heart, at night feels like it stops. Denies chest pain or SOB. No symptoms during call.  Will send to Dr. Anner for any further recommendations. Pt verbalizes understanding of plan.

## 2024-02-17 ENCOUNTER — Ambulatory Visit: Attending: Physician Assistant | Admitting: Physician Assistant

## 2024-02-17 ENCOUNTER — Ambulatory Visit

## 2024-02-17 ENCOUNTER — Encounter: Payer: Self-pay | Admitting: Physician Assistant

## 2024-02-17 VITALS — BP 110/58 | HR 75 | Ht 59.0 in | Wt 142.4 lb

## 2024-02-17 DIAGNOSIS — I10 Essential (primary) hypertension: Secondary | ICD-10-CM

## 2024-02-17 DIAGNOSIS — R0789 Other chest pain: Secondary | ICD-10-CM | POA: Diagnosis not present

## 2024-02-17 DIAGNOSIS — I739 Peripheral vascular disease, unspecified: Secondary | ICD-10-CM

## 2024-02-17 DIAGNOSIS — R0602 Shortness of breath: Secondary | ICD-10-CM | POA: Diagnosis not present

## 2024-02-17 DIAGNOSIS — E785 Hyperlipidemia, unspecified: Secondary | ICD-10-CM

## 2024-02-17 DIAGNOSIS — R002 Palpitations: Secondary | ICD-10-CM

## 2024-02-17 NOTE — Progress Notes (Unsigned)
Enrolled patient for a 14 day Zio XT monitor to be mailed to patients home  Nichole Walters to read

## 2024-02-17 NOTE — Patient Instructions (Addendum)
 Medication Instructions:  NO CHANGES *If you need a refill on your cardiac medications before your next appointment, please call your pharmacy*  Lab Work: TSH TODAY If you have labs (blood work) drawn today and your tests are completely normal, you will receive your results only by: MyChart Message (if you have MyChart) OR A paper copy in the mail If you have any lab test that is abnormal or we need to change your treatment, we will call you to review the results.  Testing/Procedures:[address] Your physician has requested that you have an echocardiogram. Echocardiography is a painless test that uses sound waves to create images of your heart. It provides your doctor with information about the size and shape of your heart and how well your hearts chambers and valves are working. This procedure takes approximately one hour. There are no restrictions for this procedure. Please do NOT wear cologne, perfume, aftershave, or lotions (deodorant is allowed). Please arrive 15 minutes prior to your appointment time.  Please note: We ask at that you not bring children with you during ultrasound (echo/ vascular) testing. Due to room size and safety concerns, children are not allowed in the ultrasound rooms during exams. Our front office staff cannot provide observation of children in our lobby area while testing is being conducted. An adult accompanying a patient to their appointment will only be allowed in the ultrasound room at the discretion of the ultrasound technician under special circumstances. We apologize for any inconvenience.   Follow-Up: At [facility], you and your health needs are our priority.  As part of our continuing mission to provide you with exceptional heart care, our providers are all part of one team.  This team includes your primary Cardiologist (physician) and Advanced Practice Providers or APPs (Physician Assistants and Nurse Practitioners) who all work together to  provide you with the care you need, when you need it.  Your next appointment:   3-4 month(s)  Provider:   Alm Clay, MD   Other Instructions GEOFFRY HEWS- Long Term Monitor Instructions  Your physician has requested you wear a ZIO patch monitor for 14 days.  This is a single patch monitor. [employer] supplies one patch monitor per enrollment. Additional stickers are not available. Please do not apply patch if you will be having a Nuclear Stress Test,  Echocardiogram, Cardiac CT, MRI, or Chest Xray during the period you would be wearing the  monitor. The patch cannot be worn during these tests. You cannot remove and re-apply the  ZIO XT patch monitor.  Your ZIO patch monitor will be mailed 3 day USPS to your address on file. It may take 3-5 days  to receive your monitor after you have been enrolled.  Once you have received your monitor, please review the enclosed instructions. Your monitor  has already been registered assigning a specific monitor serial # to you.  Billing and Patient Assistance Program Information  We have supplied [employer] with any of your insurance information on file for billing purposes. [employer] offers a sliding scale Patient Assistance Program for patients that do not have  insurance, or whose insurance does not completely cover the cost of the ZIO monitor.  You must apply for the Patient Assistance Program to qualify for this discounted rate.  To apply, please call [employer] at [phone], select option 4, select option 2, ask to apply for  Patient Assistance Program. Meredeth will ask your household income, and how many people  are in your household. They  will quote your out-of-pocket cost based on that information.  [employer] will also be able to set up a 44-month, interest-free payment plan if needed.  Applying the monitor   Shave hair from upper left chest.  Hold abrader disc by orange tab. Rub abrader in 40 strokes over the upper left chest as  indicated in your  monitor instructions.  Clean area with 4 enclosed alcohol pads. Let dry.  Apply patch as indicated in monitor instructions. Patch will be placed under collarbone on left  side of chest with arrow pointing upward.  Rub patch adhesive wings for 2 minutes. Remove white label marked 1. Remove the white  label marked 2. Rub patch adhesive wings for 2 additional minutes.  While looking in a mirror, press and release button in center of patch. A small green light will  flash 3-4 times. This will be your only indicator that the monitor has been turned on.  Do not shower for the first 24 hours. You may shower after the first 24 hours.  Press the button if you feel a symptom. You will hear a small click. Record Date, Time and  Symptom in the Patient Logbook.  When you are ready to remove the patch, follow instructions on the last 2 pages of Patient  Logbook. Stick patch monitor onto the last page of Patient Logbook.  Place Patient Logbook in the blue and white box. Use locking tab on box and tape box closed  securely. The blue and white box has prepaid postage on it. Please place it in the mailbox as  soon as possible. Your physician should have your test results approximately 7 days after the  monitor has been mailed back to [facility].  Call [facility] Customer Care at [phone] if you have questions regarding  your ZIO XT patch monitor. Call them immediately if you see an orange light blinking on your  monitor.  If your monitor falls off in less than 4 days, contact our Monitor department at [phone].  If your monitor becomes loose or falls off after 4 days call [employer] at [phone] for  suggestions on securing your monitor

## 2024-02-17 NOTE — Progress Notes (Unsigned)
°  Cardiology Office Note   Date:  02/17/2024  ID:  Shelagh, Rayman 03-13-1950, MRN [id number] PCP: Jordan, Betty G, MD  [facility] Providers Cardiologist:  Alm Clay, MD { Click to update primary MD,subspecialty MD or APP then REFRESH:1}    History of Present Illness Nichole Walters is a 73 y.o. female with past medical history of hypertension, hyperlipidemia, bradycardia, palpitation and elevated coronary calcium  score.  ABI was normal in July 2021.  Coronary calcium  scoring test obtained on 07/08/2021 showed chronic calcium  score of 212, majority of the calcium  is seen in the left main and the LAD territory, this placed the patient at 88th percentile for age and sex matched control, aortic atherosclerosis and a small pericardial effusion.  Heart monitor obtained in November 2023 showed average heart rate 72, less than 1% PAC and PVCs, 14 episodes of atrial tachycardia ranged between 4-11 beats.  No sustained arrhythmia.  Patient was last seen by Dr. Clay in January 2025 at which time she was doing well.  60-month follow-up was recommended.  She contacted cardiology service yesterday complaining of funny feeling in her chest.  Patient presents today for evaluation of palpitation.  She has chronic palpitation for 30 years, however recently the characteristic the palpitation has changed.  She still notices them more so at rest and described almost a sensation where the blood stop flowing or the heart stops.  She denies any exertional chest pain.  She does complain of slight shortness of breath with exertion but attributed to weight gain.  I recommended a echocardiogram and a 2-week heart monitor.  She will also need a TSH as well.  We discussed the need to cut back on caffeine.  If all test come back reassuring, she can follow-up with Dr. Clay either in March or April 2026.  Given lack of any exertional chest pain, will hold off on ischemic workup.  ROS:  ***  Studies Reviewed      *** Risk Assessment/Calculations {Does this patient have ATRIAL FIBRILLATION?:[phone]} No BP recorded.  {Refresh Note OR Click here to enter BP  :1}***       Physical Exam VS:  There were no vitals taken for this visit.       Wt Readings from Last 3 Encounters:  01/24/24 143 lb (64.9 kg)  07/23/23 129 lb 3.2 oz (58.6 kg)  04/02/23 131 lb (59.4 kg)    GEN: Well nourished, well developed in no acute distress NECK: No JVD; No carotid bruits CARDIAC: ***RRR, no murmurs, rubs, gallops RESPIRATORY:  Clear to auscultation without rales, wheezing or rhonchi  ABDOMEN: Soft, non-tender, non-distended EXTREMITIES:  No edema; No deformity   ASSESSMENT AND PLAN ***    {Are you ordering a CV Procedure (e.g. stress test, cath, DCCV, TEE, etc)?   Press F2        :[id number]}  Dispo: ***  Signed, Scot Ford, PA

## 2024-02-18 ENCOUNTER — Ambulatory Visit: Payer: Self-pay | Admitting: Physician Assistant

## 2024-02-18 LAB — TSH: TSH: 0.862 u[IU]/mL (ref 0.450–4.500)

## 2024-02-18 NOTE — Progress Notes (Signed)
 Normal thyroid level

## 2024-03-23 ENCOUNTER — Emergency Department (HOSPITAL_BASED_OUTPATIENT_CLINIC_OR_DEPARTMENT_OTHER)

## 2024-03-23 ENCOUNTER — Encounter (HOSPITAL_BASED_OUTPATIENT_CLINIC_OR_DEPARTMENT_OTHER): Payer: Self-pay | Admitting: Emergency Medicine

## 2024-03-23 ENCOUNTER — Emergency Department (HOSPITAL_BASED_OUTPATIENT_CLINIC_OR_DEPARTMENT_OTHER)
Admission: EM | Admit: 2024-03-23 | Discharge: 2024-03-23 | Disposition: A | Discharge status: Home / Self Care | Attending: Emergency Medicine | Admitting: Emergency Medicine

## 2024-03-23 ENCOUNTER — Emergency Department (HOSPITAL_BASED_OUTPATIENT_CLINIC_OR_DEPARTMENT_OTHER): Admission: EM | Admit: 2024-03-23 | Discharge: 2024-03-23 | Discharge status: Absent without leave

## 2024-03-23 DIAGNOSIS — Z79899 Other long term (current) drug therapy: Secondary | ICD-10-CM | POA: Insufficient documentation

## 2024-03-23 DIAGNOSIS — I10 Essential (primary) hypertension: Secondary | ICD-10-CM | POA: Diagnosis not present

## 2024-03-23 DIAGNOSIS — I2510 Atherosclerotic heart disease of native coronary artery without angina pectoris: Secondary | ICD-10-CM | POA: Insufficient documentation

## 2024-03-23 DIAGNOSIS — R0600 Dyspnea, unspecified: Secondary | ICD-10-CM | POA: Insufficient documentation

## 2024-03-23 DIAGNOSIS — R002 Palpitations: Secondary | ICD-10-CM

## 2024-03-23 LAB — BASIC METABOLIC PANEL WITH GFR
Anion gap: 11 (ref 5–15)
BUN: 20 mg/dL (ref 8–23)
CO2: 27 mmol/L (ref 22–32)
Calcium: 10.4 mg/dL — ABNORMAL HIGH (ref 8.9–10.3)
Chloride: 104 mmol/L (ref 98–111)
Creatinine, Ser: 0.74 mg/dL (ref 0.44–1.00)
GFR, Estimated: >60 mL/min
Glucose, Bld: 82 mg/dL (ref 70–99)
Potassium: 3.8 mmol/L (ref 3.5–5.1)
Sodium: 141 mmol/L (ref 135–145)

## 2024-03-23 LAB — CBC
HCT: 37.3 % (ref 36.0–46.0)
Hemoglobin: 11.9 g/dL — ABNORMAL LOW (ref 12.0–15.0)
MCH: 25.6 pg — ABNORMAL LOW (ref 26.0–34.0)
MCHC: 31.9 g/dL (ref 30.0–36.0)
MCV: 80.4 fL (ref 80.0–100.0)
Platelets: 245 K/uL (ref 150–400)
RBC: 4.64 MIL/uL (ref 3.87–5.11)
RDW: 15.1 % (ref 11.5–15.5)
WBC: 7.3 K/uL (ref 4.0–10.5)
nRBC: 0 % (ref 0.0–0.2)

## 2024-03-23 LAB — TROPONIN T, HIGH SENSITIVITY: Troponin T High Sensitivity: 6 ng/L (ref 0–19)

## 2024-03-23 NOTE — Discharge Instructions (Signed)
Follow-up with your cardiologist in the next few days, and return to the ER if symptoms significantly worsen or change.

## 2024-03-23 NOTE — ED Triage Notes (Signed)
 Pt states chest prussure, pain, SOB and irregular heart beat off and on for months, seen by PCP and Cardio. Just had a monitor on last week.

## 2024-03-23 NOTE — ED Provider Notes (Signed)
 " [facility] Provider Note   CSN: [id number] Arrival date & time: 03/23/24  0122     Patient presents with: Chest Pain   Nichole Walters is a 74 y.o. female.   Patient is a 74 year old female with history of coronary artery disease, hypertension, hyperlipidemia, peripheral artery disease.  Patient presenting today with complaints of palpitations.  She tells me that she has been experiencing episodes where her heart skips beats and also will beat rapidly.  This has been ongoing for quite some time and has been seen by cardiology for this.  She just recently turned in what sounds like an event monitor for the same, but does not know the results.  This evening while she was sleeping, she woke up feeling as though her heart stopped and feeling tight in her chest.  This lasted a brief period of time, then presented to the ER.  She is now basically symptom-free.       Prior to Admission medications  Medication Sig Start Date End Date Taking? Authorizing Provider  amLODipine  (NORVASC ) 5 MG tablet TAKE 1 TABLET (5 MG TOTAL) BY MOUTH DAILY. 08/09/23   Jordan, Betty G, MD  atorvastatin  (LIPITOR) 10 MG tablet TAKE 15 MG TO 20 MG BY MOUTH AS TOLERATED DAILY 10/28/23   Anner Alm ORN, MD  calcium  carbonate (CALTRATE 600) 1500 (600 Ca) MG TABS tablet Take 600 mg of elemental calcium  by mouth 2 (two) times daily with a meal.    [provider]  latanoprost (XALATAN) 0.005 % ophthalmic solution Place 1 drop into both eyes at bedtime. 03/29/13   [provider]  OVER THE COUNTER MEDICATION Liga-complex-take one tablet two to three times daily    [provider]  valsartan -hydrochlorothiazide  (DIOVAN -HCT) 160-12.5 MG tablet TAKE 1 TABLET BY MOUTH EVERY DAY 06/21/23   Anner Alm ORN, MD    Allergies: Citalopram    Review of Systems  All other systems reviewed and are negative.   Updated Vital Signs BP (!) 158/72    Pulse 78   Resp 17   Ht 4' 11 (1.499 m)   Wt 64.1 kg   SpO2 99%   BMI 28.54 kg/m   Physical Exam Vitals and nursing note reviewed.  Constitutional:      General: She is not in acute distress.    Appearance: She is well-developed. She is not diaphoretic.  HENT:     Head: Normocephalic and atraumatic.  Cardiovascular:     Rate and Rhythm: Normal rate and regular rhythm.     Heart sounds: No murmur heard.    No friction rub. No gallop.  Pulmonary:     Effort: Pulmonary effort is normal. No respiratory distress.     Breath sounds: Normal breath sounds. No wheezing.  Abdominal:     General: Bowel sounds are normal. There is no distension.     Palpations: Abdomen is soft.     Tenderness: There is no abdominal tenderness.  Musculoskeletal:        General: Normal range of motion.     Cervical back: Normal range of motion and neck supple.     Right lower leg: No tenderness. No edema.     Left lower leg: No tenderness. No edema.  Skin:    General: Skin is warm and dry.  Neurological:     General: No focal deficit present.     Mental Status: She is alert and oriented to person, place, and  time.     (all labs ordered are listed, but only abnormal results are displayed) Labs Reviewed  CBC - Abnormal; Notable for the following components:      Result Value   Hemoglobin 11.9 (*)    MCH 25.6 (*)    All other components within normal limits  BASIC METABOLIC PANEL WITH GFR  TROPONIN T, HIGH SENSITIVITY    EKG: EKG Interpretation Date/Time:  Thursday March 23 2024 01:35:26 EST Ventricular Rate:  66 PR Interval:  175 QRS Duration:  86 QT Interval:  401 QTC Calculation: 421 R Axis:   74  Text Interpretation: Sinus rhythm Normal ECG Confirmed by Geroldine Berg ([id number]) on 03/23/2024 1:37:38 AM  Radiology: No results found.   Procedures   Medications Ordered in the ED - No data to display                                  Medical Decision Making Amount and/or  Complexity of Data Reviewed Labs: ordered. Radiology: ordered.   Patient is a 74 year old female presenting with palpitations and shortness of breath that woke her from sleep.  She has been having these episodes for many months and has been seen by cardiology and her primary doctor.  Patient arrives here with stable vital signs and is afebrile.  Laboratory studies obtained including CBC, metabolic panel, and troponin, all of which are unremarkable.  Chest x-ray is clear.  Patient has been observed for 2 hours in the ER and has had no ectopy.  She has maintained a sinus rhythm with oxygen saturations in the upper 90s while on room air.  I am uncertain as to the etiology of these episodes, but nothing tonight appears emergent.  This has been ongoing for quite some time and will refer patient back to her cardiologist.     Final diagnoses:  None    ED Discharge Orders     None          Geroldine Berg, MD 03/23/24 [phone]  "

## 2024-03-29 ENCOUNTER — Ambulatory Visit (HOSPITAL_COMMUNITY)
Admission: RE | Admit: 2024-03-29 | Discharge: 2024-03-29 | Disposition: A | Discharge status: Home / Self Care | Source: Ambulatory Visit | Attending: Cardiovascular Disease | Admitting: Cardiovascular Disease

## 2024-03-29 DIAGNOSIS — R0602 Shortness of breath: Secondary | ICD-10-CM | POA: Insufficient documentation

## 2024-03-29 LAB — ECHOCARDIOGRAM COMPLETE
Area-P 1/2: 3.83 cm2
S' Lateral: 3.1 cm

## 2024-03-29 MED ORDER — METOPROLOL TARTRATE 25 MG PO TABS: 12.5000 mg | ORAL_TABLET | Freq: Two times a day (BID) | ORAL | 3 refills | Status: AC

## 2024-04-07 ENCOUNTER — Other Ambulatory Visit (HOSPITAL_COMMUNITY): Payer: Self-pay | Admitting: Cardiology

## 2024-04-07 DIAGNOSIS — Z8249 Family history of ischemic heart disease and other diseases of the circulatory system: Secondary | ICD-10-CM

## 2024-06-06 ENCOUNTER — Other Ambulatory Visit (HOSPITAL_BASED_OUTPATIENT_CLINIC_OR_DEPARTMENT_OTHER)

## 2024-06-20 ENCOUNTER — Ambulatory Visit: Admitting: Cardiology
